# Patient Record
Sex: Male | Born: 1955 | Race: Black or African American | Hispanic: No | Marital: Single | Smoking: Never smoker
Health system: Southern US, Community
[De-identification: ages and names within clinical notes are randomized; demographics above are authoritative.]

## PROBLEM LIST (undated history)

## (undated) DIAGNOSIS — Z955 Presence of coronary angioplasty implant and graft: Secondary | ICD-10-CM

## (undated) DIAGNOSIS — Z9861 Coronary angioplasty status: Secondary | ICD-10-CM

## (undated) DIAGNOSIS — H8110 Benign paroxysmal vertigo, unspecified ear: Secondary | ICD-10-CM

## (undated) DIAGNOSIS — M545 Low back pain, unspecified: Secondary | ICD-10-CM

## (undated) DIAGNOSIS — I2119 ST elevation (STEMI) myocardial infarction involving other coronary artery of inferior wall: Secondary | ICD-10-CM

## (undated) DIAGNOSIS — T7840XA Allergy, unspecified, initial encounter: Secondary | ICD-10-CM

## (undated) DIAGNOSIS — I255 Ischemic cardiomyopathy: Secondary | ICD-10-CM

## (undated) DIAGNOSIS — I10 Essential (primary) hypertension: Secondary | ICD-10-CM

## (undated) DIAGNOSIS — I2510 Atherosclerotic heart disease of native coronary artery without angina pectoris: Secondary | ICD-10-CM

## (undated) DIAGNOSIS — E785 Hyperlipidemia, unspecified: Secondary | ICD-10-CM

## (undated) HISTORY — DX: Low back pain, unspecified: M54.50

## (undated) HISTORY — DX: Atherosclerotic heart disease of native coronary artery without angina pectoris: I25.10

## (undated) HISTORY — PX: NECK SURGERY: SHX720

## (undated) HISTORY — DX: ST elevation (STEMI) myocardial infarction involving other coronary artery of inferior wall: I21.19

## (undated) HISTORY — DX: Hyperlipidemia, unspecified: E78.5

## (undated) HISTORY — DX: Benign paroxysmal vertigo, unspecified ear: H81.10

## (undated) HISTORY — DX: Allergy, unspecified, initial encounter: T78.40XA

## (undated) HISTORY — DX: Low back pain: M54.5

## (undated) HISTORY — DX: Essential (primary) hypertension: I10

## (undated) HISTORY — DX: Atherosclerotic heart disease of native coronary artery without angina pectoris: Z98.61

## (undated) SURGERY — Surgical Case
Anesthesia: *Unknown

---

## 2004-07-18 ENCOUNTER — Ambulatory Visit: Payer: Self-pay | Admitting: Internal Medicine

## 2006-01-17 ENCOUNTER — Ambulatory Visit: Payer: Self-pay | Admitting: Internal Medicine

## 2006-06-04 ENCOUNTER — Ambulatory Visit: Payer: Self-pay | Admitting: Internal Medicine

## 2006-06-04 LAB — CONVERTED CEMR LAB
ALT: 36 units/L (ref 0–40)
Albumin: 4.1 g/dL (ref 3.5–5.2)
Alkaline Phosphatase: 63 units/L (ref 39–117)
BUN: 10 mg/dL (ref 6–23)
Calcium: 9.4 mg/dL (ref 8.4–10.5)
Chloride: 96 meq/L (ref 96–112)
Cholesterol: 233 mg/dL (ref 0–200)
Hemoglobin: 15 g/dL (ref 13.0–17.0)
Hgb A1c MFr Bld: 10.6 % — ABNORMAL HIGH (ref 4.6–6.0)
PSA: 0.65 ng/mL (ref 0.10–4.00)
Platelets: 251 10*3/uL (ref 150–400)
RBC: 5.07 M/uL (ref 4.22–5.81)
Total Protein: 7.4 g/dL (ref 6.0–8.3)

## 2006-07-05 ENCOUNTER — Ambulatory Visit: Payer: Self-pay | Admitting: Internal Medicine

## 2006-07-10 ENCOUNTER — Ambulatory Visit: Payer: Self-pay | Admitting: Internal Medicine

## 2006-07-24 ENCOUNTER — Encounter: Admission: RE | Admit: 2006-07-24 | Discharge: 2006-10-22 | Payer: Self-pay | Admitting: Internal Medicine

## 2006-08-13 ENCOUNTER — Ambulatory Visit: Payer: Self-pay | Admitting: Internal Medicine

## 2007-01-15 DIAGNOSIS — M545 Low back pain, unspecified: Secondary | ICD-10-CM | POA: Insufficient documentation

## 2007-01-15 DIAGNOSIS — J309 Allergic rhinitis, unspecified: Secondary | ICD-10-CM | POA: Insufficient documentation

## 2008-02-12 ENCOUNTER — Encounter: Admission: RE | Admit: 2008-02-12 | Discharge: 2008-02-12 | Payer: Self-pay | Admitting: Sports Medicine

## 2008-03-02 ENCOUNTER — Encounter: Admission: RE | Admit: 2008-03-02 | Discharge: 2008-03-02 | Payer: Self-pay | Admitting: Sports Medicine

## 2008-03-25 ENCOUNTER — Encounter: Admission: RE | Admit: 2008-03-25 | Discharge: 2008-03-25 | Payer: Self-pay | Admitting: Sports Medicine

## 2008-04-07 ENCOUNTER — Ambulatory Visit (HOSPITAL_BASED_OUTPATIENT_CLINIC_OR_DEPARTMENT_OTHER): Admission: RE | Admit: 2008-04-07 | Discharge: 2008-04-07 | Payer: Self-pay | Admitting: *Deleted

## 2008-04-07 ENCOUNTER — Encounter (INDEPENDENT_AMBULATORY_CARE_PROVIDER_SITE_OTHER): Payer: Self-pay | Admitting: *Deleted

## 2008-07-16 ENCOUNTER — Ambulatory Visit: Payer: Self-pay | Admitting: Internal Medicine

## 2008-07-16 LAB — CONVERTED CEMR LAB
BUN: 17 mg/dL (ref 6–23)
Basophils Relative: 1.1 % (ref 0.0–3.0)
Bilirubin Urine: NEGATIVE
Blood in Urine, dipstick: NEGATIVE
Calcium: 9.9 mg/dL (ref 8.4–10.5)
Chloride: 105 meq/L (ref 96–112)
Creatinine, Ser: 1 mg/dL (ref 0.4–1.5)
Eosinophils Relative: 9.5 % — ABNORMAL HIGH (ref 0.0–5.0)
GFR calc Af Amer: 102 mL/min
GFR calc non Af Amer: 84 mL/min
Glucose, Bld: 144 mg/dL — ABNORMAL HIGH (ref 70–99)
Glucose, Urine, Semiquant: NEGATIVE
HCT: 42.7 % (ref 39.0–52.0)
Hemoglobin: 14.2 g/dL (ref 13.0–17.0)
MCHC: 33.3 g/dL (ref 30.0–36.0)
MCV: 89.7 fL (ref 78.0–100.0)
Microalb Creat Ratio: 12.1 mg/g (ref 0.0–30.0)
Microalb, Ur: 2.1 mg/dL — ABNORMAL HIGH (ref 0.0–1.9)
Monocytes Relative: 7.8 % (ref 3.0–12.0)
Neutro Abs: 1.7 10*3/uL (ref 1.4–7.7)
Neutrophils Relative %: 32.7 % — ABNORMAL LOW (ref 43.0–77.0)
Nitrite: NEGATIVE
Protein, U semiquant: NEGATIVE
RBC: 4.76 M/uL (ref 4.22–5.81)
RDW: 12.7 % (ref 11.5–14.6)
TSH: 1.35 microintl units/mL (ref 0.35–5.50)
Total Protein: 7.6 g/dL (ref 6.0–8.3)
Urobilinogen, UA: 0.2
VLDL: 17 mg/dL (ref 0–40)
WBC Urine, dipstick: NEGATIVE
WBC: 5.3 10*3/uL (ref 4.5–10.5)

## 2008-07-23 ENCOUNTER — Ambulatory Visit: Payer: Self-pay | Admitting: Internal Medicine

## 2008-07-23 DIAGNOSIS — E1169 Type 2 diabetes mellitus with other specified complication: Secondary | ICD-10-CM | POA: Insufficient documentation

## 2008-07-23 DIAGNOSIS — E118 Type 2 diabetes mellitus with unspecified complications: Secondary | ICD-10-CM | POA: Insufficient documentation

## 2008-07-23 DIAGNOSIS — E785 Hyperlipidemia, unspecified: Secondary | ICD-10-CM

## 2008-10-15 ENCOUNTER — Ambulatory Visit: Payer: Self-pay | Admitting: Internal Medicine

## 2008-10-15 LAB — HM DIABETES EYE EXAM: HM Diabetic Eye Exam: NORMAL

## 2008-10-28 ENCOUNTER — Encounter: Payer: Self-pay | Admitting: Internal Medicine

## 2009-01-20 ENCOUNTER — Encounter: Payer: Self-pay | Admitting: Internal Medicine

## 2009-03-23 ENCOUNTER — Encounter: Payer: Self-pay | Admitting: Internal Medicine

## 2009-04-15 ENCOUNTER — Encounter: Payer: Self-pay | Admitting: *Deleted

## 2009-08-12 ENCOUNTER — Observation Stay (HOSPITAL_COMMUNITY): Admission: EM | Admit: 2009-08-12 | Discharge: 2009-08-16 | Payer: Self-pay | Admitting: Emergency Medicine

## 2009-08-13 ENCOUNTER — Ambulatory Visit: Payer: Self-pay | Admitting: Vascular Surgery

## 2009-08-13 ENCOUNTER — Encounter (INDEPENDENT_AMBULATORY_CARE_PROVIDER_SITE_OTHER): Payer: Self-pay | Admitting: Internal Medicine

## 2009-10-27 DIAGNOSIS — H8110 Benign paroxysmal vertigo, unspecified ear: Secondary | ICD-10-CM

## 2009-10-27 HISTORY — DX: Benign paroxysmal vertigo, unspecified ear: H81.10

## 2010-06-19 ENCOUNTER — Encounter: Payer: Self-pay | Admitting: Sports Medicine

## 2010-08-22 LAB — BASIC METABOLIC PANEL
BUN: 7 mg/dL (ref 6–23)
BUN: 7 mg/dL (ref 6–23)
CO2: 27 mEq/L (ref 19–32)
CO2: 28 mEq/L (ref 19–32)
Calcium: 8.5 mg/dL (ref 8.4–10.5)
Calcium: 9.2 mg/dL (ref 8.4–10.5)
Chloride: 101 mEq/L (ref 96–112)
Chloride: 105 mEq/L (ref 96–112)
Creatinine, Ser: 0.93 mg/dL (ref 0.4–1.5)
GFR calc Af Amer: >60 mL/min (ref >60)
GFR calc Af Amer: >60 mL/min (ref >60)
GFR calc non Af Amer: >60 mL/min (ref >60)
GFR calc non Af Amer: >60 mL/min (ref >60)
Glucose, Bld: 130 mg/dL — ABNORMAL HIGH (ref 70–99)
Glucose, Bld: 225 mg/dL — ABNORMAL HIGH (ref 70–99)
Potassium: 3.6 mEq/L (ref 3.5–5.1)
Sodium: 135 mEq/L (ref 135–145)
Sodium: 137 mEq/L (ref 135–145)
Sodium: 139 mEq/L (ref 135–145)

## 2010-08-22 LAB — DIFFERENTIAL
Basophils Absolute: 0 10*3/uL (ref 0.0–0.1)
Basophils Relative: 0 % (ref 0–1)
Eosinophils Absolute: 0.1 10*3/uL (ref 0.0–0.7)
Eosinophils Relative: 1 % (ref 0–5)
Lymphs Abs: 2.9 10*3/uL (ref 0.7–4.0)
Monocytes Absolute: 0.7 10*3/uL (ref 0.1–1.0)
Monocytes Relative: 5 % (ref 3–12)
Neutro Abs: 9.9 10*3/uL — ABNORMAL HIGH (ref 1.7–7.7)
Neutrophils Relative %: 72 % (ref 43–77)

## 2010-08-22 LAB — GLUCOSE, CAPILLARY
Glucose-Capillary: 185 mg/dL — ABNORMAL HIGH (ref 70–99)
Glucose-Capillary: 205 mg/dL — ABNORMAL HIGH (ref 70–99)
Glucose-Capillary: 223 mg/dL — ABNORMAL HIGH (ref 70–99)
Glucose-Capillary: 239 mg/dL — ABNORMAL HIGH (ref 70–99)
Glucose-Capillary: 254 mg/dL — ABNORMAL HIGH (ref 70–99)
Glucose-Capillary: 266 mg/dL — ABNORMAL HIGH (ref 70–99)
Glucose-Capillary: 345 mg/dL — ABNORMAL HIGH (ref 70–99)

## 2010-08-22 LAB — URINALYSIS, ROUTINE W REFLEX MICROSCOPIC
Bilirubin Urine: NEGATIVE
Glucose, UA: >1000 mg/dL — AB
Hgb urine dipstick: NEGATIVE
Ketones, ur: >80 mg/dL — AB
Leukocytes, UA: NEGATIVE
Nitrite: NEGATIVE
Specific Gravity, Urine: 1.042 — ABNORMAL HIGH (ref 1.005–1.030)
pH: 5.5 (ref 5.0–8.0)

## 2010-08-22 LAB — CBC
HCT: 38.1 % — ABNORMAL LOW (ref 39.0–52.0)
HCT: 39.4 % (ref 39.0–52.0)
HCT: 40.9 % (ref 39.0–52.0)
HCT: 43.4 % (ref 39.0–52.0)
Hemoglobin: 12.8 g/dL — ABNORMAL LOW (ref 13.0–17.0)
Hemoglobin: 12.8 g/dL — ABNORMAL LOW (ref 13.0–17.0)
Hemoglobin: 13.3 g/dL (ref 13.0–17.0)
MCHC: 33.2 g/dL (ref 30.0–36.0)
MCHC: 33.4 g/dL (ref 30.0–36.0)
MCHC: 34.3 g/dL (ref 30.0–36.0)
MCV: 90.2 fL (ref 78.0–100.0)
MCV: 91.1 fL (ref 78.0–100.0)
Platelets: 191 10*3/uL (ref 150–400)
Platelets: 218 10*3/uL (ref 150–400)
RBC: 4.11 MIL/uL — ABNORMAL LOW (ref 4.22–5.81)
RBC: 4.2 MIL/uL — ABNORMAL LOW (ref 4.22–5.81)
RBC: 4.37 MIL/uL (ref 4.22–5.81)
RBC: 4.76 MIL/uL (ref 4.22–5.81)
RDW: 12.7 % (ref 11.5–15.5)
RDW: 13.3 % (ref 11.5–15.5)
WBC: 13.7 10*3/uL — ABNORMAL HIGH (ref 4.0–10.5)
WBC: 7.2 10*3/uL (ref 4.0–10.5)
WBC: 7.6 10*3/uL (ref 4.0–10.5)

## 2010-08-22 LAB — COMPREHENSIVE METABOLIC PANEL
ALT: 21 U/L (ref 0–53)
Albumin: 4.4 g/dL (ref 3.5–5.2)
Calcium: 9.1 mg/dL (ref 8.4–10.5)
GFR calc non Af Amer: >60 mL/min (ref >60)
Total Bilirubin: 0.8 mg/dL (ref 0.3–1.2)
Total Protein: 7.6 g/dL (ref 6.0–8.3)

## 2010-08-22 LAB — POCT CARDIAC MARKERS
CKMB, poc: <1 ng/mL — ABNORMAL LOW (ref 1.0–8.0)
Troponin i, poc: <0.05 ng/mL (ref 0.00–0.09)

## 2010-08-22 LAB — HEMOGLOBIN A1C
Hgb A1c MFr Bld: 11.8 % — ABNORMAL HIGH (ref 4.6–6.1)
Mean Plasma Glucose: 298 mg/dL

## 2010-08-22 LAB — LIPASE, BLOOD: Lipase: 32 U/L (ref 11–59)

## 2010-10-11 NOTE — Op Note (Signed)
Matthew Prince, Matthew Prince                  ACCOUNT NO.:  [id number]   MEDICAL RECORD NO.:  [id number]          PATIENT TYPE:  AMB   LOCATION:  [facility]                         FACILITY:  [facility]   PHYSICIAN:  Alfonse Ras, MD   DATE OF BIRTH:  1956-04-06   DATE OF PROCEDURE:  DATE OF DISCHARGE:                               OPERATIVE REPORT   PREOPERATIVE DIAGNOSIS:  Subcutaneous mass in the posterior neck.   POSTOPERATIVE DIAGNOSIS:  Subcutaneous mass in the posterior neck,  lipoma.   OPERATION:  Excision of subcutaneous mass of the posterior neck.   ANESTHESIA:  General endotracheal tube.   DESCRIPTION:  After informed consent was granted, the patient was taken  to the operating room and given general anesthesia.  He was placed in  the prone position.  Posterior neck was prepped and draped in normal  sterile fashion.  Using a transverse incision over the palpable mass, I  dissected down through the epidermis and subcutaneous tissue using Bovie  electrocautery.  The mass was mobilized using Bovie electrocautery off  the muscular fascia and excised in its entirety and sent for pathologic  evaluation.  Adequate hemostasis was ensured.  The wound was copiously  irrigated and closed in two layers using a running 2-0 Vicryl  subcutaneous stitch and a 3-0 Monocryl.  Sterile dressing was applied.  The patient tolerated the procedure well and went to PACU in good  condition.      Alfonse Ras, MD  Electronically Signed     KRE/MEDQ  D:  04/07/2008  T:  04/07/2008  Job:  [id number]

## 2010-10-14 NOTE — Assessment & Plan Note (Signed)
[facility]                            [facility] OFFICE NOTE   PHARES, ZACCONE                         MRN:          [id number]  DATE:07/05/2006                            DOB:          1956-04-06    The patient is seen today for followup of his diabetes.  He was seen 1  month ago and noted to have a random sugar in excess of 500.  Unfortunately, he left for [place] the following day and has returned  today for a followup.  He has just returned from [place].  He has noticed  some perhaps slight increase in urination and thirst, but no change in  his vision.  Random blood sugar today was 346.   DISPOSITION:  The patient received extensive diabetic supplies and  literature today.  He is given a home monitor and will be started on  meal-time insulin with a sliding scale.  He will be set up for DTC  classes.  Dietary information was dispensed.  He is also started on  glimepiride 4 mg b.i.d. and metformin 500 mg b.i.d.  He will return  early next week for followup.     Gordy Savers, MD  Electronically Signed    PFK/MedQ  DD: 07/05/2006  DT: 07/05/2006  Job #: [id number]

## 2010-10-14 NOTE — Assessment & Plan Note (Signed)
[name]                            [facility] OFFICE NOTE   RAKEEN, GAILLARD                         MRN:          [id number]  DATE:06/04/2006                            DOB:          1955/12/02    A 55 year old African American male who was seen today for a wellness  exam.  He has had chronic low back pain probably related more to spasm.  He has enjoyed excellent health and has a history of mild borderline  hypercholesterolemia, as well as some mild seasonal allergic rhinitis  type symptoms.  He also has a complaint of an enlarging lump involving  his posterior neck area.   PAST MEDICAL HISTORY:  Is negative.  No prior hospital admissions.  Nonsmoker.   FAMILY HISTORY:  Father died at 49 of unclear causes, mother died at 44,  unclear causes. Both lived in [place].  Four brothers, four sisters, all  well except for a sister who died an accidental death.   SOCIAL HISTORY:  He is married, 2 children.  He was born in [place] and  works as a Teacher, adult education.   EXAMINATION:  Revealed a healthy appearing fit male, no acute distress.  Blood pressure 120/82.  Fundi, ear, nose and throat clear.  NECK:  Did reveal approximate 4 to 5 cm subcutaneous nodule.  This was  nontender, not inflamed.  CHEST:  Was clear.  CARDIOVASCULAR:  Normal heart sounds, no murmurs.  ABDOMEN:  Benign.  EXTERNAL GENITALIA:  Normal.  RECTAL:  Prostate not well evaluated due to tight sphincter and  discomfort.  EXTREMITIES:  Negative with full peripheral pulses.   IMPRESSION:  Unremarkable clinical exam.  Mild hypercholesterolemia.  Seasonal allergic rhinitis.  Probable sebaceous cyst involving his  posterior neck.  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   DISPOSITION:  Stretching exercises were discussed again and encouraged.  He has not tried this in the past.  He will continue p.r.n. anti-  inflammatories as needed.  Laboratory profile  will be obtained.  Return  here in 1 or 2 years for followup.     Gordy Savers, MD  Electronically Signed    PFK/MedQ  DD: 06/04/2006  DT: 06/04/2006  Job #: [id number]

## 2010-11-23 ENCOUNTER — Other Ambulatory Visit (INDEPENDENT_AMBULATORY_CARE_PROVIDER_SITE_OTHER): Payer: PRIVATE HEALTH INSURANCE

## 2010-11-23 DIAGNOSIS — Z0000 Encounter for general adult medical examination without abnormal findings: Secondary | ICD-10-CM

## 2010-11-23 DIAGNOSIS — Z0389 Encounter for observation for other suspected diseases and conditions ruled out: Secondary | ICD-10-CM

## 2010-11-23 LAB — URINALYSIS
Bilirubin Urine: NEGATIVE
Hgb urine dipstick: NEGATIVE
Ketones, ur: NEGATIVE
Leukocytes, UA: NEGATIVE
Specific Gravity, Urine: >=1.03 (ref 1.000–1.030)
Urine Glucose: NEGATIVE
Urobilinogen, UA: 0.2 (ref 0.0–1.0)

## 2010-11-23 LAB — LIPID PANEL
HDL: 39 mg/dL — ABNORMAL LOW (ref >39.00)
Total CHOL/HDL Ratio: 7
Triglycerides: 57 mg/dL (ref 0.0–149.0)
VLDL: 11.4 mg/dL (ref 0.0–40.0)

## 2010-11-23 LAB — LDL CHOLESTEROL, DIRECT: Direct LDL: 182 mg/dL

## 2010-11-23 LAB — BASIC METABOLIC PANEL
CO2: 29 mEq/L (ref 19–32)
Chloride: 104 mEq/L (ref 96–112)
Glucose, Bld: 117 mg/dL — ABNORMAL HIGH (ref 70–99)
Potassium: 4.8 mEq/L (ref 3.5–5.1)
Sodium: 139 mEq/L (ref 135–145)

## 2010-11-23 LAB — CBC WITH DIFFERENTIAL/PLATELET
Basophils Absolute: 0 10*3/uL (ref 0.0–0.1)
Basophils Relative: 0.8 % (ref 0.0–3.0)
Eosinophils Absolute: 0.2 10*3/uL (ref 0.0–0.7)
Hemoglobin: 14.8 g/dL (ref 13.0–17.0)
Lymphocytes Relative: 47.7 % — ABNORMAL HIGH (ref 12.0–46.0)
Lymphs Abs: 2.9 10*3/uL (ref 0.7–4.0)
MCHC: 33.5 g/dL (ref 30.0–36.0)
MCV: 90.5 fl (ref 78.0–100.0)
Monocytes Absolute: 0.5 10*3/uL (ref 0.1–1.0)
Neutro Abs: 2.4 10*3/uL (ref 1.4–7.7)
RDW: 13.7 % (ref 11.5–14.6)

## 2010-11-23 LAB — TSH: TSH: 1.33 u[IU]/mL (ref 0.35–5.50)

## 2010-11-23 LAB — HEPATIC FUNCTION PANEL
ALT: 26 U/L (ref 0–53)
Bilirubin, Direct: 0.1 mg/dL (ref 0.0–0.3)
Total Bilirubin: 0.6 mg/dL (ref 0.3–1.2)

## 2010-11-29 ENCOUNTER — Encounter: Payer: Self-pay | Admitting: Internal Medicine

## 2010-12-01 ENCOUNTER — Encounter: Payer: Self-pay | Admitting: Internal Medicine

## 2010-12-01 ENCOUNTER — Ambulatory Visit (INDEPENDENT_AMBULATORY_CARE_PROVIDER_SITE_OTHER): Payer: Managed Care, Other (non HMO) | Admitting: Internal Medicine

## 2010-12-01 DIAGNOSIS — E119 Type 2 diabetes mellitus without complications: Secondary | ICD-10-CM

## 2010-12-01 DIAGNOSIS — Z0000 Encounter for general adult medical examination without abnormal findings: Secondary | ICD-10-CM

## 2010-12-01 DIAGNOSIS — E785 Hyperlipidemia, unspecified: Secondary | ICD-10-CM

## 2010-12-01 DIAGNOSIS — Z23 Encounter for immunization: Secondary | ICD-10-CM

## 2010-12-01 MED ORDER — METFORMIN HCL 1000 MG PO TABS: 1000.0000 mg | ORAL_TABLET | Freq: Two times a day (BID) | ORAL | Status: DC

## 2010-12-01 MED ORDER — ATORVASTATIN CALCIUM 40 MG PO TABS: 40.0000 mg | ORAL_TABLET | Freq: Every day | ORAL | Status: DC

## 2010-12-01 NOTE — Patient Instructions (Signed)
Please check your hemoglobin A1c every 3 months    It is important that you exercise regularly, at least 20 minutes 3 to 4 times per week.  If you develop chest pain or shortness of breath seek  medical attention.  Schedule your colonoscopy to help detect colon cancer.

## 2010-12-01 NOTE — Progress Notes (Signed)
Subjective:    Patient ID: Matthew Prince, male    DOB: Jun 18, 1955, 55 y.o.   MRN: [id number]  HPI  55 year old patient who is seen today for a wellness exam he has a history of type 2 diabetes but has stopped followup with his endocrinologist and has been on no medications for in excess of 6 months. He has a history of dyslipidemia and has been on Crestor in the past. He has also been on niacin. Recent laboratory studies were reviewed a fasting blood sugar was 117 and random blood sugar today 134. He has adopted the much improved lifestyle changes with better diet and exercise. His weight is unchanged.  Current Allergies:  IODINE (IODINE)  Past Medical History:  Reviewed history from 01/15/2007 and no changes required:  hypercholesterolemia  Allergic rhinitis  Low back pain  Diabetes mellitus, type II  Past Surgical History:  Reviewed history from 01/15/2007 and no changes required:  Denies surgical history  resection benign tumor, posterior neck  Family History:  Reviewed history from 01/15/2007 and no changes required:  father died age 21, unclear cause  mother died age 29, unclear cause. Both lived in [place]  Social History:  Reviewed history from 01/15/2007 and no changes required:  Never Smoked  Alcohol use-no  Occupation: [employer]-  Married-one son one daughter  born in [place]. Father had 5 wise he has 4 brothers 4 sisters details of their health unknown      Review of Systems  Constitutional: Negative for fever, chills, activity change, appetite change and fatigue.  HENT: Negative for hearing loss, ear pain, congestion, rhinorrhea, sneezing, mouth sores, trouble swallowing, neck pain, neck stiffness, dental problem, voice change, sinus pressure and tinnitus.   Eyes: Negative for photophobia, pain, redness and visual disturbance.  Respiratory: Negative for apnea, cough, choking, chest tightness, shortness of breath and wheezing.   Cardiovascular: Negative for chest pain,  palpitations and leg swelling.  Gastrointestinal: Negative for nausea, vomiting, abdominal pain, diarrhea, constipation, blood in stool, abdominal distention, anal bleeding and rectal pain.  Genitourinary: Negative for dysuria, urgency, frequency, hematuria, flank pain, decreased urine volume, discharge, penile swelling, scrotal swelling, difficulty urinating, genital sores and testicular pain.  Musculoskeletal: Negative for myalgias, back pain, joint swelling, arthralgias and gait problem.  Skin: Negative for color change, rash and wound.  Neurological: Negative for dizziness, tremors, seizures, syncope, facial asymmetry, speech difficulty, weakness, light-headedness, numbness and headaches.  Hematological: Negative for adenopathy. Does not bruise/bleed easily.  Psychiatric/Behavioral: Negative for suicidal ideas, hallucinations, behavioral problems, confusion, sleep disturbance, self-injury, dysphoric mood, decreased concentration and agitation. The patient is not nervous/anxious.        Objective:   Physical Exam  Constitutional: He appears well-developed and well-nourished.  HENT:  Head: Normocephalic and atraumatic.  Right Ear: External ear normal.  Left Ear: External ear normal.  Nose: Nose normal.  Mouth/Throat: Oropharynx is clear and moist.  Eyes: Conjunctivae and EOM are normal. Pupils are equal, round, and reactive to light. No scleral icterus.  Neck: Normal range of motion. Neck supple. No JVD present. No thyromegaly present.  Cardiovascular: Regular rhythm, normal heart sounds and intact distal pulses.  Exam reveals no gallop and no friction rub.   No murmur heard. Pulmonary/Chest: Effort normal and breath sounds normal. He exhibits no tenderness.  Abdominal: Soft. Bowel sounds are normal. He exhibits no distension and no mass. There is no tenderness.  Genitourinary: Prostate normal and penis normal.  Musculoskeletal: Normal range of motion. He exhibits no edema  and no  tenderness.  Lymphadenopathy:    He has no cervical adenopathy.  Neurological: He is alert. He has normal reflexes. No cranial nerve deficit. Coordination normal.  Skin: Skin is warm and dry. No rash noted.  Psychiatric: He has a normal mood and affect. His behavior is normal.          Assessment & Plan:    Annual examination Type 2 diabetes. Seems well controlled off medications at the present time we'll check a hemoglobin A1c and resume metformin therapy only Dyslipidemia. Will place on atorvastatin 40 mg daily. We'll discontinue niacin  Recheck 3 months

## 2010-12-27 ENCOUNTER — Ambulatory Visit (AMBULATORY_SURGERY_CENTER): Payer: Managed Care, Other (non HMO) | Admitting: *Deleted

## 2010-12-27 VITALS — Ht 68.0 in | Wt 180.0 lb

## 2010-12-27 DIAGNOSIS — Z1211 Encounter for screening for malignant neoplasm of colon: Secondary | ICD-10-CM

## 2010-12-27 MED ORDER — PEG-KCL-NACL-NASULF-NA ASC-C 100 G PO SOLR: ORAL | Status: DC

## 2010-12-28 ENCOUNTER — Encounter: Payer: Self-pay | Admitting: Gastroenterology

## 2011-01-09 ENCOUNTER — Encounter: Payer: Self-pay | Admitting: Gastroenterology

## 2011-01-09 ENCOUNTER — Ambulatory Visit (AMBULATORY_SURGERY_CENTER): Payer: Managed Care, Other (non HMO) | Admitting: Gastroenterology

## 2011-01-09 VITALS — BP 138/85 | HR 86 | Temp 97.5°F | Resp 20 | Ht 68.0 in | Wt 180.0 lb

## 2011-01-09 DIAGNOSIS — D126 Benign neoplasm of colon, unspecified: Secondary | ICD-10-CM | POA: Insufficient documentation

## 2011-01-09 DIAGNOSIS — D129 Benign neoplasm of anus and anal canal: Secondary | ICD-10-CM

## 2011-01-09 DIAGNOSIS — Z1211 Encounter for screening for malignant neoplasm of colon: Secondary | ICD-10-CM | POA: Insufficient documentation

## 2011-01-09 DIAGNOSIS — K5730 Diverticulosis of large intestine without perforation or abscess without bleeding: Secondary | ICD-10-CM | POA: Insufficient documentation

## 2011-01-09 DIAGNOSIS — D128 Benign neoplasm of rectum: Secondary | ICD-10-CM

## 2011-01-09 LAB — GLUCOSE, CAPILLARY
Glucose-Capillary: 116 mg/dL — ABNORMAL HIGH (ref 70–99)
Glucose-Capillary: 104 mg/dL — ABNORMAL HIGH (ref 70–99)

## 2011-01-09 MED ORDER — SODIUM CHLORIDE 0.9 % IV SOLN: 500.0000 mL | INTRAVENOUS | Status: DC

## 2011-01-09 NOTE — Patient Instructions (Signed)
Please read the handouts given to you by your recovery room nurse.    You need to increase the fiber in your diet due to the diverticulosis in your colon.    Your biopsy results will be mailed to you within 2 weeks.   Resume your routine medications today.  IF you have any questions, please call us at [phone].  Thank-you.

## 2011-01-10 ENCOUNTER — Telehealth: Payer: Self-pay

## 2011-01-10 NOTE — Telephone Encounter (Signed)
No ID on answering machine. 

## 2011-01-13 ENCOUNTER — Encounter: Payer: Self-pay | Admitting: Gastroenterology

## 2011-02-28 LAB — POCT I-STAT 4, (NA,K, GLUC, HGB,HCT): Glucose, Bld: 146 — ABNORMAL HIGH

## 2011-02-28 LAB — GLUCOSE, CAPILLARY: Glucose-Capillary: 157 — ABNORMAL HIGH

## 2011-03-03 ENCOUNTER — Ambulatory Visit (INDEPENDENT_AMBULATORY_CARE_PROVIDER_SITE_OTHER): Payer: Managed Care, Other (non HMO) | Admitting: Internal Medicine

## 2011-03-03 ENCOUNTER — Encounter: Payer: Self-pay | Admitting: Internal Medicine

## 2011-03-03 DIAGNOSIS — E785 Hyperlipidemia, unspecified: Secondary | ICD-10-CM

## 2011-03-03 DIAGNOSIS — E119 Type 2 diabetes mellitus without complications: Secondary | ICD-10-CM

## 2011-03-03 LAB — GLUCOSE, POCT (MANUAL RESULT ENTRY): POC Glucose: 146

## 2011-03-03 NOTE — Patient Instructions (Signed)
Please check your hemoglobin A1c every 3 months    It is important that you exercise regularly, at least 20 minutes 3 to 4 times per week.  If you develop chest pain or shortness of breath seek  medical attention.  You need to lose weight.  Consider a lower calorie diet and regular exercise. 

## 2011-03-03 NOTE — Progress Notes (Signed)
  Subjective:    Patient ID: Matthew Prince, male    DOB: 12/22/1955, 55 y.o.   MRN: [id number]  HPI  55 year old patient who is seen today for followup of his diabetes. He was seen 3 months ago after a prolonged absence of off medications. He had adopted the much better lifestyle changes and had maintained reasonable glycemic control off medications metformin 1000 mg twice daily was resumed. Over the last 3 months his weight is up 3 pounds he states that he is now not exercising very regularly. He feels well.    Review of Systems  Constitutional: Negative for fever, chills, appetite change and fatigue.  HENT: Negative for hearing loss, ear pain, congestion, sore throat, trouble swallowing, neck stiffness, dental problem, voice change and tinnitus.   Eyes: Negative for pain, discharge and visual disturbance.  Respiratory: Negative for cough, chest tightness, wheezing and stridor.   Cardiovascular: Negative for chest pain, palpitations and leg swelling.  Gastrointestinal: Negative for nausea, vomiting, abdominal pain, diarrhea, constipation, blood in stool and abdominal distention.  Genitourinary: Negative for urgency, hematuria, flank pain, discharge, difficulty urinating and genital sores.  Musculoskeletal: Negative for myalgias, back pain, joint swelling, arthralgias and gait problem.  Skin: Negative for rash.  Neurological: Negative for dizziness, syncope, speech difficulty, weakness, numbness and headaches.  Hematological: Negative for adenopathy. Does not bruise/bleed easily.  Psychiatric/Behavioral: Negative for behavioral problems and dysphoric mood. The patient is not nervous/anxious.        Objective:   Physical Exam  Constitutional: He is oriented to person, place, and time. He appears well-developed.  HENT:  Head: Normocephalic.  Right Ear: External ear normal.  Left Ear: External ear normal.  Eyes: Conjunctivae and EOM are normal.  Neck: Normal range of motion.    Cardiovascular: Normal rate and normal heart sounds.   Pulmonary/Chest: Breath sounds normal.  Abdominal: Bowel sounds are normal.  Musculoskeletal: Normal range of motion. He exhibits no edema and no tenderness.  Neurological: He is alert and oriented to person, place, and time.  Psychiatric: He has a normal mood and affect. His behavior is normal.          Assessment & Plan:    Diabetes mellitus. Will check a hemoglobin A1c. Last measures discussed and encouraged. If his   hemoglobin A1c is in excess of 7 will add a sulfonylurea  Dyslipidemia will continue atorvastatin

## 2011-03-03 NOTE — Progress Notes (Signed)
  Subjective:    Patient ID: Matthew Prince, male    DOB: December 17, 1955, 55 y.o.   MRN: [id number]  HPI  Wt Readings from Last 3 Encounters:  03/03/11 183 lb (83.008 kg)  01/09/11 180 lb (81.647 kg)  12/27/10 180 lb (81.647 kg)     Review of Systems     Objective:   Physical Exam        Assessment & Plan:

## 2011-05-02 ENCOUNTER — Telehealth: Payer: Self-pay | Admitting: Family Medicine

## 2011-05-02 NOTE — Telephone Encounter (Signed)
Last seen 03/03/11 3 mos rov - please advise on letter

## 2011-05-02 NOTE — Telephone Encounter (Signed)
Patient is scheduled to have dental laser surgery on Dec 7th. They need a letter or statement faxed to their office stating that his diabetes is in a stable condition where pt is ok to have procedure done. Fax: [phone], ATTNFlorentina Addison.

## 2011-05-02 NOTE — Telephone Encounter (Signed)
Note dictated

## 2011-05-03 ENCOUNTER — Telehealth: Payer: Self-pay

## 2011-05-03 NOTE — Telephone Encounter (Signed)
Letter has been faxed.

## 2011-05-03 NOTE — Telephone Encounter (Signed)
Opened in error

## 2011-06-02 ENCOUNTER — Encounter: Payer: Self-pay | Admitting: Internal Medicine

## 2011-06-02 ENCOUNTER — Ambulatory Visit (INDEPENDENT_AMBULATORY_CARE_PROVIDER_SITE_OTHER): Payer: Managed Care, Other (non HMO) | Admitting: Internal Medicine

## 2011-06-02 DIAGNOSIS — E785 Hyperlipidemia, unspecified: Secondary | ICD-10-CM

## 2011-06-02 DIAGNOSIS — E119 Type 2 diabetes mellitus without complications: Secondary | ICD-10-CM

## 2011-06-02 MED ORDER — METFORMIN HCL 1000 MG PO TABS: 1000.0000 mg | ORAL_TABLET | Freq: Two times a day (BID) | ORAL | Status: DC

## 2011-06-02 MED ORDER — ATORVASTATIN CALCIUM 40 MG PO TABS: 40.0000 mg | ORAL_TABLET | Freq: Every day | ORAL | Status: DC

## 2011-06-02 NOTE — Patient Instructions (Signed)
Please check your hemoglobin A1c every 3 months    It is important that you exercise regularly, at least 20 minutes 3 to 4 times per week.  If you develop chest pain or shortness of breath seek  medical attention.   

## 2011-06-02 NOTE — Progress Notes (Signed)
  Subjective:    Patient ID: Matthew Prince, male    DOB: [birth year], 56 y.o.   MRN: [id number]  HPI 56 year old patient who is in today for followup of his type diabetes. He has treated dyslipidemia on atorvastatin 40 mg daily. He is doing quite well although he checks home blood sugar readings in frequently. He is on metformin therapy alone for diabetic control his weight has been stable. No recent hemoglobin A1c    Review of Systems  Constitutional: Negative for fever, chills, appetite change and fatigue.  HENT: Negative for hearing loss, ear pain, congestion, sore throat, trouble swallowing, neck stiffness, dental problem, voice change and tinnitus.   Eyes: Negative for pain, discharge and visual disturbance.  Respiratory: Negative for cough, chest tightness, wheezing and stridor.   Cardiovascular: Negative for chest pain, palpitations and leg swelling.  Gastrointestinal: Negative for nausea, vomiting, abdominal pain, diarrhea, constipation, blood in stool and abdominal distention.  Genitourinary: Negative for urgency, hematuria, flank pain, discharge, difficulty urinating and genital sores.  Musculoskeletal: Negative for myalgias, back pain, joint swelling, arthralgias and gait problem.  Skin: Negative for rash.  Neurological: Negative for dizziness, syncope, speech difficulty, weakness, numbness and headaches.  Hematological: Negative for adenopathy. Does not bruise/bleed easily.  Psychiatric/Behavioral: Negative for behavioral problems and dysphoric mood. The patient is not nervous/anxious.        Objective:   Physical Exam  Constitutional: He is oriented to person, place, and time. He appears well-developed.  HENT:  Head: Normocephalic.  Right Ear: External ear normal.  Left Ear: External ear normal.  Eyes: Conjunctivae and EOM are normal.  Neck: Normal range of motion.  Cardiovascular: Normal rate and normal heart sounds.   Pulmonary/Chest: Breath sounds normal.  Abdominal:  Bowel sounds are normal.  Musculoskeletal: Normal range of motion. He exhibits no edema and no tenderness.  Neurological: He is alert and oriented to person, place, and time.  Psychiatric: He has a normal mood and affect. His behavior is normal.          Assessment & Plan:    Diabetes mellitus unclear control. We'll check a hemoglobin A1c. We'll continue aggressive lifestyle changes. They require the addition of a sulfonylurea if hemoglobin A1c in excess of 7 Dyslipidemia. We'll check a total cholesterol

## 2011-06-03 LAB — HEMOGLOBIN A1C: Mean Plasma Glucose: 148 mg/dL — ABNORMAL HIGH (ref <117)

## 2011-08-31 ENCOUNTER — Encounter: Payer: Self-pay | Admitting: Internal Medicine

## 2011-08-31 ENCOUNTER — Ambulatory Visit (INDEPENDENT_AMBULATORY_CARE_PROVIDER_SITE_OTHER): Payer: Managed Care, Other (non HMO) | Admitting: Internal Medicine

## 2011-08-31 VITALS — BP 124/80 | Temp 98.0°F | Wt 185.0 lb

## 2011-08-31 DIAGNOSIS — E785 Hyperlipidemia, unspecified: Secondary | ICD-10-CM

## 2011-08-31 DIAGNOSIS — E119 Type 2 diabetes mellitus without complications: Secondary | ICD-10-CM

## 2011-08-31 NOTE — Progress Notes (Signed)
  Subjective:    Patient ID: Matthew Prince, male    DOB: [birth year], 56 y.o.   MRN: [id number]  HPI  56 year old patient who has type 2 diabetes and dyslipidemia. He is seen today for his quarterly followup. He is doing quite well without concerns or complaints. His weight is up 3 pounds his last hemoglobin A1c was 6.8    Review of Systems  Constitutional: Negative for fever, chills, appetite change and fatigue.  HENT: Negative for hearing loss, ear pain, congestion, sore throat, trouble swallowing, neck stiffness, dental problem, voice change and tinnitus.   Eyes: Negative for pain, discharge and visual disturbance.  Respiratory: Negative for cough, chest tightness, wheezing and stridor.   Cardiovascular: Negative for chest pain, palpitations and leg swelling.  Gastrointestinal: Negative for nausea, vomiting, abdominal pain, diarrhea, constipation, blood in stool and abdominal distention.  Genitourinary: Negative for urgency, hematuria, flank pain, discharge, difficulty urinating and genital sores.  Musculoskeletal: Negative for myalgias, back pain, joint swelling, arthralgias and gait problem.  Skin: Negative for rash.  Neurological: Negative for dizziness, syncope, speech difficulty, weakness, numbness and headaches.  Hematological: Negative for adenopathy. Does not bruise/bleed easily.  Psychiatric/Behavioral: Negative for behavioral problems and dysphoric mood. The patient is not nervous/anxious.        Objective:   Physical Exam  Constitutional: He is oriented to person, place, and time. He appears well-developed.  HENT:  Head: Normocephalic.  Right Ear: External ear normal.  Left Ear: External ear normal.  Eyes: Conjunctivae and EOM are normal.  Neck: Normal range of motion.  Cardiovascular: Normal rate and normal heart sounds.   Pulmonary/Chest: Breath sounds normal.  Abdominal: Bowel sounds are normal.  Musculoskeletal: Normal range of motion. He exhibits no edema and no  tenderness.  Neurological: He is alert and oriented to person, place, and time.  Psychiatric: He has a normal mood and affect. His behavior is normal.          Assessment & Plan:   Diabetes mellitus. We'll check a hemoglobin A1c I ; ophthalmology exam recommended Dyslipidemia stable

## 2011-08-31 NOTE — Patient Instructions (Signed)
It is important that you exercise regularly, at least 20 minutes 3 to 4 times per week.  If you develop chest pain or shortness of breath seek  medical attention.   Please check your hemoglobin A1c every 3 months  You need to lose weight.  Consider a lower calorie diet and regular exercise. 

## 2011-08-31 NOTE — Progress Notes (Signed)
  Subjective:    Patient ID: Matthew Prince, male    DOB: [birth year], 56 y.o.   MRN: [id number]  HPI  Wt Readings from Last 3 Encounters:  08/31/11 185 lb (83.915 kg)  06/02/11 182 lb (82.555 kg)  03/03/11 183 lb (83.008 kg)    Review of Systems     Objective:   Physical Exam        Assessment & Plan:

## 2011-12-01 ENCOUNTER — Encounter: Payer: Self-pay | Admitting: Internal Medicine

## 2011-12-01 ENCOUNTER — Ambulatory Visit (INDEPENDENT_AMBULATORY_CARE_PROVIDER_SITE_OTHER): Payer: Managed Care, Other (non HMO) | Admitting: Internal Medicine

## 2011-12-01 VITALS — BP 122/80 | Temp 98.2°F | Wt 183.0 lb

## 2011-12-01 DIAGNOSIS — E785 Hyperlipidemia, unspecified: Secondary | ICD-10-CM

## 2011-12-01 DIAGNOSIS — E119 Type 2 diabetes mellitus without complications: Secondary | ICD-10-CM

## 2011-12-01 NOTE — Patient Instructions (Addendum)
Limit your sodium (Salt) intake    It is important that you exercise regularly, at least 20 minutes 3 to 4 times per week.  If you develop chest pain or shortness of breath seek  medical attention.   Please check your hemoglobin A1c every 3 months   

## 2011-12-01 NOTE — Progress Notes (Signed)
  Subjective:    Patient ID: Matthew Prince, male    DOB: 1956/03/18, 56 y.o.   MRN: [id number]  HPI 56 year old patient who has a history of type 2 diabetes. No recent hemoglobin A1c. Random blood sugar this afternoon to 79. He has dyslipidemia which has been controlled on atorvastatin. No new concerns or complaints.    Review of Systems  Constitutional: Negative for fever, chills, appetite change and fatigue.  HENT: Negative for hearing loss, ear pain, congestion, sore throat, trouble swallowing, neck stiffness, dental problem, voice change and tinnitus.   Eyes: Negative for pain, discharge and visual disturbance.  Respiratory: Negative for cough, chest tightness, wheezing and stridor.   Cardiovascular: Negative for chest pain, palpitations and leg swelling.  Gastrointestinal: Negative for nausea, vomiting, abdominal pain, diarrhea, constipation, blood in stool and abdominal distention.  Genitourinary: Negative for urgency, hematuria, flank pain, discharge, difficulty urinating and genital sores.  Musculoskeletal: Negative for myalgias, back pain, joint swelling, arthralgias and gait problem.  Skin: Negative for rash.  Neurological: Negative for dizziness, syncope, speech difficulty, weakness, numbness and headaches.  Hematological: Negative for adenopathy. Does not bruise/bleed easily.  Psychiatric/Behavioral: Negative for behavioral problems and dysphoric mood. The patient is not nervous/anxious.        Objective:   Physical Exam  Constitutional: He appears well-developed and well-nourished. No distress.       Blood pressure well controlled          Assessment & Plan:   Diabetes mellitus. And a blood sugar 279. We'll check a hemoglobin A1c. Will likely require additional medication Dyslipidemia stable

## 2011-12-04 ENCOUNTER — Other Ambulatory Visit: Payer: Self-pay | Admitting: Internal Medicine

## 2011-12-04 MED ORDER — GLIMEPIRIDE 2 MG PO TABS: 2.0000 mg | ORAL_TABLET | Freq: Every day | ORAL | Status: DC

## 2011-12-04 NOTE — Progress Notes (Signed)
Quick Note:  Spoke with pt- informed of lab results and new med to start - [facility]. ______

## 2011-12-04 NOTE — Progress Notes (Signed)
Quick Note:  Attempt to call hm - no ans no mach , attmept to call cell# - VM - LMTCB to discuss lab results and new med to start ______

## 2011-12-25 ENCOUNTER — Other Ambulatory Visit: Payer: Self-pay | Admitting: Internal Medicine

## 2012-03-01 ENCOUNTER — Ambulatory Visit (INDEPENDENT_AMBULATORY_CARE_PROVIDER_SITE_OTHER): Payer: Managed Care, Other (non HMO) | Admitting: Internal Medicine

## 2012-03-01 ENCOUNTER — Encounter: Payer: Self-pay | Admitting: Internal Medicine

## 2012-03-01 VITALS — BP 112/70 | Temp 98.1°F | Wt 180.0 lb

## 2012-03-01 DIAGNOSIS — E785 Hyperlipidemia, unspecified: Secondary | ICD-10-CM

## 2012-03-01 DIAGNOSIS — E119 Type 2 diabetes mellitus without complications: Secondary | ICD-10-CM

## 2012-03-01 LAB — HEMOGLOBIN A1C: Hgb A1c MFr Bld: 9.2 % — ABNORMAL HIGH (ref 4.6–6.5)

## 2012-03-01 MED ORDER — METFORMIN HCL 1000 MG PO TABS: 1000.0000 mg | ORAL_TABLET | Freq: Two times a day (BID) | ORAL | Status: DC

## 2012-03-01 MED ORDER — ATORVASTATIN CALCIUM 40 MG PO TABS: 40.0000 mg | ORAL_TABLET | Freq: Every day | ORAL | Status: DC

## 2012-03-01 MED ORDER — GLIMEPIRIDE 2 MG PO TABS: 2.0000 mg | ORAL_TABLET | Freq: Every day | ORAL | Status: DC

## 2012-03-01 NOTE — Progress Notes (Signed)
  Subjective:    Patient ID: Matthew Prince, male    DOB: 1956/01/17, 56 y.o.   MRN: [id number]  HPI  56 year old patient who has type 2 diabetes. He was seen here 3 months ago and hemoglobin A1c had increased to greater than 10. He was placed on Amaryl 2 mg daily which she took for 30 days. He has been off this medication for the past 60 days. He was unaware that he was to take this medication daily. He says fasting blood sugars are generally in the 140-145 range. Remains on atorvastatin for dyslipidemia. Past Medical History  Diagnosis Date  . Hyperlipidemia   . Allergy   . Diabetes mellitus   . Low back pain   . Benign positional vertigo 10/2009    History   Social History  . Marital Status: Married    Spouse Name: N/A    Number of Children: N/A  . Years of Education: N/A   Occupational History  . Not on file.   Social History Main Topics  . Smoking status: Never Smoker   . Smokeless tobacco: Never Used  . Alcohol Use: No  . Drug Use: Not on file  . Sexually Active: Not on file   Other Topics Concern  . Not on file   Social History Narrative  . No narrative on file    Past Surgical History  Procedure Date  . Neck surgery     resection benign tumor    No family history on file.  Allergies  Allergen Reactions  . Iodine     REACTION: unspecified    Current Outpatient Prescriptions on File Prior to Visit  Medication Sig Dispense Refill  . DISCONTD: atorvastatin (LIPITOR) 40 MG tablet TAKE 1 TABLET BY MOUTH ONCE DAILY  90 tablet  6  . DISCONTD: metFORMIN (GLUCOPHAGE) 1000 MG tablet TAKE 1 TABLET BY MOUTH TWICE A DAY WITH A MEAL  180 tablet  6    BP 112/70  Temp 98.1 F (36.7 C) (Oral)  Wt 180 lb (81.647 kg)        Review of Systems  Constitutional: Negative for fever, chills, appetite change and fatigue.  HENT: Negative for hearing loss, ear pain, congestion, sore throat, trouble swallowing, neck stiffness, dental problem, voice change and tinnitus.     Eyes: Negative for pain, discharge and visual disturbance.  Respiratory: Negative for cough, chest tightness, wheezing and stridor.   Cardiovascular: Negative for chest pain, palpitations and leg swelling.  Gastrointestinal: Negative for nausea, vomiting, abdominal pain, diarrhea, constipation, blood in stool and abdominal distention.  Genitourinary: Negative for urgency, hematuria, flank pain, discharge, difficulty urinating and genital sores.  Musculoskeletal: Negative for myalgias, back pain, joint swelling, arthralgias and gait problem.  Skin: Negative for rash.  Neurological: Negative for dizziness, syncope, speech difficulty, weakness, numbness and headaches.  Hematological: Negative for adenopathy. Does not bruise/bleed easily.  Psychiatric/Behavioral: Negative for behavioral problems and dysphoric mood. The patient is not nervous/anxious.        Objective:   Physical Exam  Constitutional: He appears well-developed and well-nourished. No distress.       Blood pressure low normal          Assessment & Plan:  Diabetes mellitus. Will check a hemoglobin A1c is greater than 7 we'll resume Amaryl Dyslipidemia stable  Weight loss exercise all encouraged Recheck 3 months

## 2012-03-01 NOTE — Progress Notes (Signed)
  Subjective:    Patient ID: Matthew Prince, male    DOB: 06/13/1955, 56 y.o.   MRN: [id number]  HPI  Wt Readings from Last 3 Encounters:  03/01/12 180 lb (81.647 kg)  12/01/11 183 lb (83.008 kg)  08/31/11 185 lb (83.915 kg)    Review of Systems     Objective:   Physical Exam        Assessment & Plan:

## 2012-03-01 NOTE — Patient Instructions (Signed)
Please check your hemoglobin A1c every 3 months  Limit your sodium (Salt) intake    It is important that you exercise regularly, at least 20 minutes 3 to 4 times per week.  If you develop chest pain or shortness of breath seek  medical attention.   

## 2012-03-04 ENCOUNTER — Telehealth: Payer: Self-pay | Admitting: Internal Medicine

## 2012-03-04 NOTE — Telephone Encounter (Signed)
Caller: [name]/Patient; Patient Name: Matthew Prince; PCP: Eleonore Chiquito [facility]); Best Callback Phone Number: [phone]. Pt calling in regard to glimepiride (AMARYL) 2 MG tablet script.  Pt went to pick up script and he only received a 30 day supply vs a 90 day as  written on the script.  Verified last encounter and saw the script for 90 days.  Script was ended on 10/04.      PLEASE FOLLOW WITH PT/PHARMACY IN REGARD TO SCRIPT.  Thank you.

## 2012-03-05 NOTE — Telephone Encounter (Signed)
Called [facility] - spoke with Florentina Addison - his ins will only pay for 30 day rx - if pt wants 90 day they can fill he just needs to let them know Spoke with pt- informed

## 2012-05-31 ENCOUNTER — Encounter: Payer: Self-pay | Admitting: Internal Medicine

## 2012-05-31 ENCOUNTER — Ambulatory Visit (INDEPENDENT_AMBULATORY_CARE_PROVIDER_SITE_OTHER): Payer: Managed Care, Other (non HMO) | Admitting: Internal Medicine

## 2012-05-31 VITALS — BP 130/80 | HR 88 | Temp 98.1°F | Resp 18 | Wt 189.0 lb

## 2012-05-31 DIAGNOSIS — E785 Hyperlipidemia, unspecified: Secondary | ICD-10-CM

## 2012-05-31 DIAGNOSIS — E119 Type 2 diabetes mellitus without complications: Secondary | ICD-10-CM

## 2012-05-31 MED ORDER — GLIMEPIRIDE 2 MG PO TABS: 4.0000 mg | ORAL_TABLET | Freq: Every day | ORAL | Status: DC

## 2012-05-31 NOTE — Progress Notes (Signed)
Subjective:    Patient ID: Matthew Prince, male    DOB: 1955-10-31, 57 y.o.   MRN: [id number]  HPI  57 year old patient who has type 2 diabetes and dyslipidemia. He is seen today for his three-month followup. Hemoglobin A1c's have been quite elevated over the past year. One year ago hemoglobin A1c was less than 7 but was 9.23 months ago. He has been on Amaryl 2 mg daily as well as metformin 2 g daily in divided dosages. He states that his blood sugar has been a bit more elevated during the Christmas holidays.  Past Medical History  Diagnosis Date  . Hyperlipidemia   . Allergy   . Diabetes mellitus   . Low back pain   . Benign positional vertigo 10/2009    History   Social History  . Marital Status: Married    Spouse Name: N/A    Number of Children: N/A  . Years of Education: N/A   Occupational History  . Not on file.   Social History Main Topics  . Smoking status: Never Smoker   . Smokeless tobacco: Never Used  . Alcohol Use: No  . Drug Use: Not on file  . Sexually Active: Not on file   Other Topics Concern  . Not on file   Social History Narrative  . No narrative on file    Past Surgical History  Procedure Date  . Neck surgery     resection benign tumor    No family history on file.  Allergies  Allergen Reactions  . Iodine     REACTION: unspecified    Current Outpatient Prescriptions on File Prior to Visit  Medication Sig Dispense Refill  . atorvastatin (LIPITOR) 40 MG tablet Take 1 tablet (40 mg total) by mouth daily.  90 tablet  6  . metFORMIN (GLUCOPHAGE) 1000 MG tablet Take 1 tablet (1,000 mg total) by mouth 2 (two) times daily with a meal.  180 tablet  6    BP 130/80  Pulse 88  Temp 98.1 F (36.7 C) (Oral)  Resp 18  Wt 189 lb (85.73 kg)  SpO2 97%       Review of Systems  Constitutional: Negative for fever, chills, appetite change and fatigue.  HENT: Negative for hearing loss, ear pain, congestion, sore throat, trouble swallowing, neck  stiffness, dental problem, voice change and tinnitus.   Eyes: Negative for pain, discharge and visual disturbance.  Respiratory: Negative for cough, chest tightness, wheezing and stridor.   Cardiovascular: Negative for chest pain, palpitations and leg swelling.  Gastrointestinal: Negative for nausea, vomiting, abdominal pain, diarrhea, constipation, blood in stool and abdominal distention.  Genitourinary: Negative for urgency, hematuria, flank pain, discharge, difficulty urinating and genital sores.  Musculoskeletal: Negative for myalgias, back pain, joint swelling, arthralgias and gait problem.  Skin: Negative for rash.  Neurological: Negative for dizziness, syncope, speech difficulty, weakness, numbness and headaches.  Hematological: Negative for adenopathy. Does not bruise/bleed easily.  Psychiatric/Behavioral: Negative for behavioral problems and dysphoric mood. The patient is not nervous/anxious.        Objective:   Physical Exam  Constitutional: He is oriented to person, place, and time. He appears well-developed.  HENT:  Head: Normocephalic.  Right Ear: External ear normal.  Left Ear: External ear normal.  Eyes: Conjunctivae normal and EOM are normal.  Neck: Normal range of motion.  Cardiovascular: Normal rate and normal heart sounds.   Pulmonary/Chest: Breath sounds normal.  Abdominal: Bowel sounds are normal.  Musculoskeletal: Normal range of  motion. He exhibits no edema and no tenderness.  Neurological: He is alert and oriented to person, place, and time.  Psychiatric: He has a normal mood and affect. His behavior is normal.          Assessment & Plan:   Diabetes mellitus. Will check a hemoglobin A1c. We'll likely need additional medication we'll increase Amaryl to 4 mg daily. Will consider GLP 1 agonist

## 2012-05-31 NOTE — Patient Instructions (Signed)
Please check your hemoglobin A1c every 3 months    It is important that you exercise regularly, at least 20 minutes 3 to 4 times per week.  If you develop chest pain or shortness of breath seek  medical attention.   

## 2012-06-01 LAB — HEMOGLOBIN A1C
Hgb A1c MFr Bld: 11.2 % — ABNORMAL HIGH (ref <5.7)
Mean Plasma Glucose: 275 mg/dL — ABNORMAL HIGH (ref <117)

## 2012-06-04 ENCOUNTER — Ambulatory Visit (INDEPENDENT_AMBULATORY_CARE_PROVIDER_SITE_OTHER): Payer: Managed Care, Other (non HMO) | Admitting: Internal Medicine

## 2012-06-04 ENCOUNTER — Encounter: Payer: Self-pay | Admitting: Internal Medicine

## 2012-06-04 VITALS — BP 150/86 | HR 87 | Temp 98.6°F | Resp 18 | Wt 190.0 lb

## 2012-06-04 DIAGNOSIS — E119 Type 2 diabetes mellitus without complications: Secondary | ICD-10-CM

## 2012-06-04 MED ORDER — GLUCOSE BLOOD VI STRP: ORAL_STRIP | Status: DC

## 2012-06-04 MED ORDER — LIRAGLUTIDE 18 MG/3ML ~~LOC~~ SOLN: 0.6000 mg | Freq: Every day | SUBCUTANEOUS | Status: DC

## 2012-06-04 NOTE — Progress Notes (Signed)
  Subjective:    Patient ID: Matthew Prince, male    DOB: 1956/05/18, 57 y.o.   MRN: [id number]  HPI  57 year old patient who is seen today for followup of his poorly controlled diabetes. Recent hemoglobin A1c was greater than 11. The patient's diabetes is quite responsive to lifestyle issues. In the past his hemoglobin A1c has been in excess of 12 but has improved to less than 7 with better diet. He has seen endocrinology in the past and briefly has been on insulin therapy which he wishes to avoid. Diabetic management discussed at length. He has had dietary counseling in the past and additional information was dispensed today    Review of Systems  Constitutional: Negative for fever, chills, appetite change and fatigue.  HENT: Negative for hearing loss, ear pain, congestion, sore throat, trouble swallowing, neck stiffness, dental problem, voice change and tinnitus.   Eyes: Negative for pain, discharge and visual disturbance.  Respiratory: Negative for cough, chest tightness, wheezing and stridor.   Cardiovascular: Negative for chest pain, palpitations and leg swelling.  Gastrointestinal: Negative for nausea, vomiting, abdominal pain, diarrhea, constipation, blood in stool and abdominal distention.  Genitourinary: Negative for urgency, hematuria, flank pain, discharge, difficulty urinating and genital sores.  Musculoskeletal: Negative for myalgias, back pain, joint swelling, arthralgias and gait problem.  Skin: Negative for rash.  Neurological: Negative for dizziness, syncope, speech difficulty, weakness, numbness and headaches.  Hematological: Negative for adenopathy. Does not bruise/bleed easily.  Psychiatric/Behavioral: Negative for behavioral problems and dysphoric mood. The patient is not nervous/anxious.        Objective:   Physical Exam  Constitutional: He appears well-developed and well-nourished. No distress.       Weight 190  Random blood sugar 194          Assessment & Plan:     Diabetes mellitus. Will start Victoza 0.6 mg daily for 1 week and then up titrate to 1.2 mg for an additional 2 weeks with a final dose of 1.8 mg. Lifestyle issues discussed Dietary counseling dispensed Recheck 3 months with hemoglobin A1c

## 2012-06-04 NOTE — Patient Instructions (Addendum)
Please check your hemoglobin A1c every 3 months  Victoza 0.6 mg daily for one week, then increase to 1.2 mg daily for 2 weeks, then 1.8 mg [id number] Calorie Diet for Diabetes Meal Planning The 1800 calorie diet is designed for eating up to 1800 calories each day. Following this diet and making healthy meal choices can help improve overall health. This diet controls blood sugar (glucose) levels and can also help lower blood pressure and cholesterol. SERVING SIZES Measuring foods and serving sizes helps to make sure you are getting the right amount of food. The list below tells how big or small some common serving sizes are:  1 oz.........4 stacked dice.   3 oz........Marland KitchenDeck of cards.   1 tsp.......Marland KitchenTip of little finger.   1 tbs......Marland KitchenMarland KitchenThumb.   2 tbs.......Marland KitchenGolf ball.    cup......Marland KitchenHalf of a fist.   1 cup.......Marland KitchenA fist.  GUIDELINES FOR CHOOSING FOODS The goal of this diet is to eat a variety of foods and limit calories to 1800 each day. This can be done by choosing foods that are low in calories and fat. The diet also suggests eating small amounts of food frequently. Doing this helps control your blood glucose levels so they do not get too high or too low. Each meal or snack may include a protein food source to help you feel more satisfied and to stabilize your blood glucose. Try to eat about the same amount of food around the same time each day. This includes weekend days, travel days, and days off work. Space your meals about 4 to 5 hours apart and add a snack between them if you wish.   For example, a daily food plan could include breakfast, a morning snack, lunch, dinner, and an evening snack. Healthy meals and snacks include whole grains, vegetables, fruits, lean meats, poultry, fish, and dairy products. As you plan your meals, select a variety of foods. Choose from the bread and starch, vegetable, fruit, dairy, and meat/protein groups. Examples of foods from each group and their suggested  serving sizes are listed below. Use measuring cups and spoons to become familiar with what a healthy portion looks like. Bread and Starch Each serving equals 15 grams of carbohydrates.  1 slice bread.    bagel.    cup cold cereal (unsweetened).    cup hot cereal or mashed potatoes.   1 small potato (size of a computer mouse).   cup cooked pasta or rice.    English muffin.   1 cup broth-based soup.   3 cups of popcorn.   4 to 6 whole-wheat crackers.    cup cooked beans, peas, or corn.  Vegetable Each serving equals 5 grams of carbohydrates.   cup cooked vegetables.   1 cup raw vegetables.    cup tomato or vegetable juice.  Fruit Each serving equals 15 grams of carbohydrates.  1 small apple or orange.   1 cup watermelon or strawberries.    cup applesauce (no sugar added).   2 tbs raisins.    banana.    cup canned fruit, packed in water, its own juice, or sweetened with a sugar substitute.    cup unsweetened fruit juice.  Dairy Each serving equals 12 to 15 grams of carbohydrates.  1 cup fat-free milk.   6 oz artificially sweetened yogurt or plain yogurt.   1 cup low-fat buttermilk.   1 cup soy milk.   1 cup almond milk.  Meat/Protein  1 large egg.   2 to 3 oz  meat, poultry, or fish.    cup low-fat cottage cheese.   1 tbs peanut butter.   1 oz low-fat cheese.    cup tuna in water.    cup tofu.  Fat  1 tsp oil.   1 tsp trans-fat-free margarine.   1 tsp butter.   1 tsp mayonnaise.   2 tbs avocado.   1 tbs salad dressing.   1 tbs cream cheese.   2 tbs sour cream.  SAMPLE 1800 CALORIE DIET PLAN Breakfast   cup unsweetened cereal (1 carb serving).   1 cup fat-free milk (1 carb serving).   1 slice whole-wheat toast (1 carb serving).    small banana (1 carb serving).   1 scrambled egg.   1 tsp trans-fat-free margarine.  Lunch  Tuna sandwich.   2 slices whole-wheat bread (2 carb servings).    cup  canned tuna in water, drained.   1 tbs reduced fat mayonnaise.   1 stalk celery, chopped.   2 slices tomato.   1 lettuce leaf.   1 cup carrot sticks.   24 to 30 seedless grapes (2 carb servings).   6 oz light yogurt (1 carb serving).  Afternoon Snack  3 graham cracker squares (1 carb serving).   Fat-free milk, 1 cup (1 carb serving).   1 tbs peanut butter.  Dinner  3 oz salmon, broiled with 1 tsp oil.   1 cup mashed potatoes (2 carb servings) with 1 tsp trans-fat-free margarine.   1 cup fresh or frozen green beans.   1 cup steamed asparagus.   1 cup fat-free milk (1 carb serving).  Evening Snack  3 cups air-popped popcorn (1 carb serving).   2 tbs parmesan cheese sprinkled on top.  MEAL PLAN Use this worksheet to help you make a daily meal plan based on the 1800 calorie diet suggestions. If you are using this plan to help you control your blood glucose, you may interchange carbohydrate-containing foods (dairy, starches, and fruits). Select a variety of fresh foods of varying colors and flavors. The total amount of carbohydrate in your meals or snacks is more important than making sure you include all of the food groups every time you eat. Choose from the following foods to build your day's meals:  8 Starches.   4 Vegetables.   3 Fruits.   2 Dairy.   6 to 7 oz Meat/Protein.   Up to 4 Fats.  Your dietician can use this worksheet to help you decide how many servings and which types of foods are right for you. BREAKFAST Food Group and Servings / Food Choice Starch ________________________________________________________ Dairy _________________________________________________________ Fruit _________________________________________________________ Meat/Protein __________________________________________________ Fat ___________________________________________________________ LUNCH Food Group and Servings / Food Choice Starch  ________________________________________________________ Meat/Protein __________________________________________________ Vegetable _____________________________________________________ Fruit _________________________________________________________ Dairy _________________________________________________________ Fat ___________________________________________________________ Aura Fey Food Group and Servings / Food Choice Starch ________________________________________________________ Meat/Protein __________________________________________________ Fruit __________________________________________________________ Dairy _________________________________________________________ Laural Golden Food Group and Servings / Food Choice Starch _________________________________________________________ Meat/Protein ___________________________________________________ Dairy __________________________________________________________ Vegetable ______________________________________________________ Fruit ___________________________________________________________ Fat ____________________________________________________________ Lollie Sails Food Group and Servings / Food Choice Fruit __________________________________________________________ Meat/Protein ___________________________________________________ Dairy __________________________________________________________ Starch _________________________________________________________ DAILY TOTALS Starch ____________________________ Vegetable _________________________ Fruit _____________________________ Dairy _____________________________ Meat/Protein______________________ Fat _______________________________ Document Released: 12/05/2004 Document Revised: 08/07/2011 Document Reviewed: 03/31/2011 [employer] Patient Information 2013 [place], [place].   Diabetes and Exercise Regular exercise is important and can help:    Control blood glucose (sugar).    Decrease blood pressure.     Control blood lipids (cholesterol, triglycerides).   Improve overall  health.  BENEFITS FROM EXERCISE  Improved fitness.   Improved flexibility.   Improved endurance.   Increased bone density.   Weight control.   Increased muscle strength.   Decreased body fat.   Improvement of the body's use of insulin, a hormone.   Increased insulin sensitivity.   Reduction of insulin needs.   Reduced stress and tension.   Helps you feel better.  People with diabetes who add exercise to their lifestyle gain additional benefits, including:  Weight loss.   Reduced appetite.   Improvement of the body's use of blood glucose.   Decreased risk factors for heart disease:   Lowering of cholesterol and triglycerides.   Raising the level of good cholesterol (high-density lipoproteins, HDL).   Lowering blood sugar.   Decreased blood pressure.  TYPE 1 DIABETES AND EXERCISE  Exercise will usually lower your blood glucose.   If blood glucose is greater than 240 mg/dl, check urine ketones. If ketones are present, do not exercise.   Location of the insulin injection sites may need to be adjusted with exercise. Avoid injecting insulin into areas of the body that will be exercised. For example, avoid injecting insulin into:   The arms when playing tennis.   The legs when jogging. For more information, discuss this with your caregiver.   Keep a record of:   Food intake.   Type and amount of exercise.   Expected peak times of insulin action.   Blood glucose levels.  Do this before, during, and after exercise. Review your records with your caregiver. This will help you to develop guidelines for adjusting food intake and insulin amounts.   TYPE 2 DIABETES AND EXERCISE  Regular physical activity can help control blood glucose.   Exercise is important because it may:   Increase the body's sensitivity to insulin.   Improve blood glucose control.    Exercise reduces the risk of heart disease. It decreases serum cholesterol and triglycerides. It also lowers blood pressure.   Those who take insulin or oral hypoglycemic agents should watch for signs of hypoglycemia. These signs include dizziness, shaking, sweating, chills, and confusion.   Body water is lost during exercise. It must be replaced. This will help to avoid loss of body fluids (dehydration) or heat stroke.  Be sure to talk to your caregiver before starting an exercise program to make sure it is safe for you. Remember, any activity is better than none.   Document Released: 08/05/2003 Document Revised: 08/07/2011 Document Reviewed: 11/19/2008 [facility] Patient Information 2013 [place], [place].   Diabetes Meal Planning Guide The diabetes meal planning guide is a tool to help you plan your meals and snacks. It is important for people with diabetes to manage their blood glucose (sugar) levels. Choosing the right foods and the right amounts throughout your day will help control your blood glucose. Eating right can even help you improve your blood pressure and reach or maintain a healthy weight. CARBOHYDRATE COUNTING MADE EASY When you eat carbohydrates, they turn to sugar. This raises your blood glucose level. Counting carbohydrates can help you control this level so you feel better. When you plan your meals by counting carbohydrates, you can have more flexibility in what you eat and balance your medicine with your food intake. Carbohydrate counting simply means adding up the total amount of carbohydrate grams in your meals and snacks. Try to eat about the same amount at each meal. Foods with carbohydrates are listed below. Each portion below is 1 carbohydrate  serving or 15 grams of carbohydrates. Ask your dietician how many grams of carbohydrates you should eat at each meal or snack. Grains and Starches  1 slice bread.    English muffin or hotdog/hamburger bun.    cup cold cereal  (unsweetened).   cup cooked pasta or rice.    cup starchy vegetables (corn, potatoes, peas, beans, winter squash).   1 tortilla (6 inches).    bagel.   1 waffle or pancake (size of a CD).    cup cooked cereal.   4 to 6 small crackers.  *Whole grain is recommended. Fruit  1 cup fresh unsweetened berries, melon, papaya, pineapple.   1 small fresh fruit.    banana or mango.    cup fruit juice (4 oz unsweetened).    cup canned fruit in natural juice or water.   2 tbs dried fruit.   12 to 15 grapes or cherries.  Milk and Yogurt  1 cup fat-free or 1% milk.   1 cup soy milk.   6 oz light yogurt with sugar-free sweetener.   6 oz low-fat soy yogurt.   6 oz plain yogurt.  Vegetables  1 cup raw or  cup cooked is counted as 0 carbohydrates or a "free" food.   If you eat 3 or more servings at 1 meal, count them as 1 carbohydrate serving.  Other Carbohydrates   oz chips or pretzels.    cup ice cream or frozen yogurt.    cup sherbet or sorbet.   2 inch square cake, no frosting.   1 tbs honey, sugar, jam, jelly, or syrup.   2 small cookies.   3 squares of graham crackers.   3 cups popcorn.   6 crackers.   1 cup broth-based soup.   Count 1 cup casserole or other mixed foods as 2 carbohydrate servings.   Foods with less than 20 calories in a serving may be counted as 0 carbohydrates or a "free" food.  You may want to purchase a book or computer software that lists the carbohydrate gram counts of different foods. In addition, the nutrition facts panel on the labels of the foods you eat are a good source of this information. The label will tell you how big the serving size is and the total number of carbohydrate grams you will be eating per serving. Divide this number by 15 to obtain the number of carbohydrate servings in a portion. Remember, 1 carbohydrate serving equals 15 grams of carbohydrate. SERVING SIZES Measuring foods and serving sizes helps  you make sure you are getting the right amount of food. The list below tells how big or small some common serving sizes are.  1 oz.........4 stacked dice.   3 oz........Marland KitchenDeck of cards.   1 tsp.......Marland KitchenTip of little finger.   1 tbs......Marland KitchenMarland KitchenThumb.   2 tbs.......Marland KitchenGolf ball.    cup......Marland KitchenHalf of a fist.   1 cup.......Marland KitchenA fist.  SAMPLE DIABETES MEAL PLAN Below is a sample meal plan that includes foods from the grain and starches, dairy, vegetable, fruit, and meat groups. A dietician can individualize a meal plan to fit your calorie needs and tell you the number of servings needed from each food group. However, controlling the total amount of carbohydrates in your meal or snack is more important than making sure you include all of the food groups at every meal. You may interchange carbohydrate containing foods (dairy, starches, and fruits). The meal plan below is an example of a 2000 calorie diet using  carbohydrate counting. This meal plan has 17 carbohydrate servings. Breakfast  1 cup oatmeal (2 carb servings).    cup light yogurt (1 carb serving).   1 cup blueberries (1 carb serving).    cup almonds.  Snack  1 large apple (2 carb servings).   1 low-fat string cheese stick.  Lunch  Chicken breast salad.   1 cup spinach.    cup chopped tomatoes.   2 oz chicken breast, sliced.   2 tbs low-fat [place] dressing.   12 whole-wheat crackers (2 carb servings).   12 to 15 grapes (1 carb serving).   1 cup low-fat milk (1 carb serving).  Snack  1 cup carrots.    cup hummus (1 carb serving).  Dinner  3 oz broiled salmon.   1 cup brown rice (3 carb servings).  Snack  1  cups steamed broccoli (1 carb serving) drizzled with 1 tsp olive oil and lemon juice.   1 cup light pudding (2 carb servings).  DIABETES MEAL PLANNING WORKSHEET Your dietician can use this worksheet to help you decide how many servings of foods and what types of foods are right for you.    BREAKFAST Food Group and Servings / Carb Servings Grain/Starches __________________________________ Dairy __________________________________________ Vegetable ______________________________________ Fruit ___________________________________________ Meat __________________________________________ Fat ____________________________________________ LUNCH Food Group and Servings / Carb Servings Grain/Starches ___________________________________ Dairy ___________________________________________ Fruit ____________________________________________ Meat ___________________________________________ Fat _____________________________________________ Laural Golden Food Group and Servings / Carb Servings Grain/Starches ___________________________________ Dairy ___________________________________________ Fruit ____________________________________________ Meat ___________________________________________ Fat _____________________________________________ SNACKS Food Group and Servings / Carb Servings Grain/Starches ___________________________________ Dairy ___________________________________________ Vegetable _______________________________________ Fruit ____________________________________________ Meat ___________________________________________ Fat _____________________________________________ DAILY TOTALS Starches _________________________ Vegetable ________________________ Fruit ____________________________ Dairy ____________________________ Meat ____________________________ Fat ______________________________ Document Released: 02/09/2005 Document Revised: 08/07/2011 Document Reviewed: 12/21/2008 [employer] Patient Information 2013 [place], [place].   Diabetes, Eating Away From Home Sometimes, you might eat in a restaurant or have meals that are prepared by someone else. You can enjoy eating out. However, the portions in restaurants may be much larger than needed. Listed below are some ideas to help  you choose foods that will keep your blood glucose (sugar) in better control.   TIPS FOR EATING OUT  Know your meal plan and how many carbohydrate servings you should have at each meal. You may wish to carry a copy of your meal plan in your purse or wallet. Learn the foods included in each food group.   Make a list of restaurants near you that offer healthy choices. Take a copy of the carry-out menus to see what they offer. Then, you can plan what you will order ahead of time.   Become familiar with serving sizes by practicing them at home using measuring cups and spoons. Once you learn to recognize portion sizes, you will be able to correctly estimate the amount of total carbohydrate you are allowed to eat at the restaurant. Ask for a takeout box if the portion is more than you should have. When your food comes, leave the amount you should have on the plate, and put the rest in the takeout box before you start eating.   Plan ahead if your mealtime will be different from usual. Check with your caregiver to find out how to time meals and medicine if you are taking insulin.   Avoid high-fat foods, such as fried foods, cream sauces, high-fat salad dressings, or any added butter or margarine.   Do not be afraid to ask questions. Ask your server about the portion size, cooking  methods, ingredients and if items can be substituted. Restaurants do not list all available items on the menu. You can ask for your main entree to be prepared using skim milk, oil instead of butter or margarine, and without gravy or sauces. Ask your waiter or waitress to serve salad dressings, gravy, sauces, margarine, and sour cream on the side. You can then add the amount your meal plan suggests.   Add more vegetables whenever possible.   Avoid items that are labeled "jumbo," "giant," "deluxe," or "supersized."   You may want to split an entre with someone and order an extra side salad.   Watch for hidden calories in foods  like croutons, bacon, or cheese.   Ask your server to take away the bread basket or chips from your table.   Order a dinner salad as an appetizer.  You can eat most foods served in a restaurant. Some foods are better choices than others. Breads and Starches  Recommended: All kinds of bread (wheat, rye, white, oatmeal, [place], [place], raisin), hard or soft dinner rolls, frankfurter or hamburger buns, small bagels, small corn or whole-wheat flour tortillas.   Avoid: Frosted or glazed breads, butter rolls, egg or cheese breads, croissants, sweet rolls, pastries, coffee cake, glazed or frosted doughnuts, muffins.  Crackers  Recommended: Animal crackers, graham, rye, saltine, oyster, and matzoth crackers. Bread sticks, melba toast, rusks, pretzels, popcorn (without fat), zwieback toast.   Avoid: High-fat snack crackers or chips. Buttered popcorn.  Cereals  Recommended: Hot and cold cereals. Whole grains such as oatmeal or shredded wheat are good choices.   Avoid: Sugar-coated or granola type cereals.  Potatoes/Pasta/Rice/Beans  Recommended: Order baked, boiled, or mashed potatoes, rice or noodles without added fat, whole beans. Order gravies, butter, margarine, or sauces on the side so you can control the amount you add.   Avoid: Hash browns or fried potatoes. Potatoes, pasta, or rice prepared with cream or cheese sauce. Potato or pasta salads prepared with large amounts of dressing. Fried beans or fried rice.  Vegetables  Recommended: Order steamed, baked, boiled, or stewed vegetables without sauces or extra fat. Ask that sauce be served on the side. If vegetables are not listed on the menu, ask what is available.   Avoid: Vegetables prepared with cream, butter, or cheese sauce. Fried vegetables.  Salad Bars  Recommended: Many of the vegetables at a salad bar are considered "free." Use lemon juice, vinegar, or low-calorie salad dressing (fewer than 20 calories per serving) as "free"  dressings for your salad. Look for salad bar ingredients that have no added fat or sugar such as tomatoes, lettuce, cucumbers, broccoli, carrots, onions, and mushrooms.   Avoid: Prepared salads with large amounts of dressing, such as coleslaw, caesar salad, macaroni salad, bean salad, or carrot salad.  Fruit  Recommended: Eat fresh fruit or fresh fruit salad without added dressing. A salad bar often offers fresh fruit choices, but canned fruit at a restaurant is usually packed in sugar or syrup.   Avoid: Sweetened canned or frozen fruits, plain or sweetened fruit juice. Fruit salads with dressing, sour cream, or sugar added to them.  Meat and Meat Substitutes  Recommended: Order broiled, baked, roasted, or grilled meat, poultry, or fish. Trim off all visible fat. Do not eat the skin of poultry. The size stated on the menu is the raw weight. Meat shrinks by  in cooking (for example, 4 oz raw equals 3 oz cooked meat).   Avoid: Deep-fat fried meat, poultry, or  fish. Breaded meats.  Eggs  Recommended: Order soft, hard-cooked, poached, or scrambled eggs. Omelets may be okay, depending on what ingredients are added. Egg substitutes are also a good choice.   Avoid: Fried eggs, eggs prepared with cream or cheese sauce.  Milk  Recommended: Order low-fat or fat-free milk according to your meal plan. Plain, nonfat yogurt or flavored yogurt with no sugar added may be used as a substitute for milk. Soy milk may also be used.   Avoid: Milk shakes or sweetened milk beverages.  Soups and Combination Foods  Recommended: Clear broth or consomm are "free" foods and may be used as an appetizer. Broth-based soups with fat removed count as a starch serving and are preferred over cream soups. Soups made with beans or split peas may be eaten but count as a starch.   Avoid: Fatty soups, soup made with cream, cheese soup. Combination foods prepared with excessive amounts of fat or with cream or cheese sauces.    Desserts and Sweets  Recommended: Ask for fresh fruit. Sponge or angel food cake without icing, ice milk, no sugar added ice cream, sherbet, or frozen yogurt may fit into your meal plan occasionally.   Avoid: Pastries, puddings, pies, cakes with icing, custard, gelatin desserts.  Fats and Oils  Recommended: Choose healthy fats such as olive oil, canola oil, or tub margarine, reduced fat or fat-free sour cream, cream cheese, avocado, or nuts.   Avoid: Any fats in excess of your allowed portion. Deep-fried foods or any food with a large amount of fat.  Note: Ask for all fats to be served on the side, and limit your portion sizes according to your meal plan. Document Released: 05/15/2005 Document Revised: 08/07/2011 Document Reviewed: 12/03/2008 [facility] Patient Information 2013 [place], [place].   Diabetes, Type 2 Diabetes is a long-lasting (chronic) disease. In type 2 diabetes, the pancreas does not make enough insulin (a hormone), and the body does not respond normally to the insulin that is made. This type of diabetes was also previously called adult-onset diabetes. It usually occurs after the age of 42, but it can occur at any age.   CAUSES   Type 2 diabetes happens because the pancreas is not making enough insulin or your body has trouble using the insulin that your pancreas does make properly. SYMPTOMS    Drinking more than usual.   Urinating more than usual.   Blurred vision.   Dry, itchy skin.   Frequent infections.   Feeling more tired than usual (fatigue).  DIAGNOSIS The diagnosis of type 2 diabetes is usually made by one of the following tests:  Fasting blood glucose test. You will not eat for at least 8 hours and then take a blood test.   Random blood glucose test. Your blood glucose (sugar) is checked at any time of the day regardless of when you ate.   Oral glucose tolerance test (OGTT). Your blood glucose is measured after you have not eaten (fasted) and then after  you drink a glucose containing beverage.  TREATMENT    Healthy eating.   Exercise.   Medicine, if needed.   Monitoring blood glucose.   Seeing your caregiver regularly.  HOME CARE INSTRUCTIONS    Check your blood glucose at least once a day. More frequent monitoring may be necessary, depending on your medicines and on how well your diabetes is controlled. Your caregiver will advise you.   Take your medicine as directed by your caregiver.   Do not smoke.  Make wise food choices. Ask your caregiver for information. Weight loss can improve your diabetes.   Learn about low blood glucose (hypoglycemia) and how to treat it.   Get your eyes checked regularly.   Have a yearly physical exam. Have your blood pressure checked and your blood and urine tested.   Wear a pendant or bracelet saying that you have diabetes.   Check your feet every night for cuts, sores, blisters, and redness. Let your caregiver know if you have any problems.  SEEK MEDICAL CARE IF:    You have problems keeping your blood glucose in target range.   You have problems with your medicines.   You have symptoms of an illness that do not improve after 24 hours.   You have a sore or wound that is not healing.   You notice a change in vision or a new problem with your vision.   You have a fever.  MAKE SURE YOU:  Understand these instructions.   Will watch your condition.   Will get help right away if you are not doing well or get worse.  Document Released: 05/15/2005 Document Revised: 08/07/2011 Document Reviewed: 10/31/2010 [facility] Patient Information 2013 [place], [place].   Diets for Diabetes, Food Labeling Look at food labels to help you decide how much of a product you can eat. You will want to check the amount of total carbohydrate in a serving to see how the food fits into your meal plan. In the list of ingredients, the ingredient present in the largest amount by weight must be listed first,  followed by the other ingredients in descending order. STANDARD OF IDENTITY Most products have a list of ingredients. However, foods that the Food and Drug Administration (FDA) has given a standard of identity do not need a list of ingredients. A standard of identity means that a food must contain certain ingredients if it is called a particular name. Examples are mayonnaise, peanut butter, ketchup, jelly, and cheese. LABELING TERMS There are many terms found on food labels. Some of these terms have specific definitions. Some terms are regulated by the FDA, and the FDA has clearly specified how they can be used. Others are not regulated or well-defined and can be misleading and confusing. SPECIFICALLY DEFINED TERMS Nutritive Sweetener.  A sweetener that contains calories,such as table sugar or honey.  Nonnutritive Sweetener.  A sweetener with few or no calories,such as saccharin, aspartame, sucralose, and cyclamate.  LABELING TERMS REGULATED BY THE FDA Free.  The product contains only a tiny or small amount of fat, cholesterol, sodium, sugar, or calories. For example, a "fat-free" product will contain less than 0.5 g of fat per serving.  Low.  A food described as "low" in fat, saturated fat, cholesterol, sodium, or calories could be eaten fairly often without exceeding dietary guidelines. For example, "low in fat" means no more than 3 g of fat per serving.  Lean.  "Lean" and "extra lean" are U.S. Department of Agriculture Architect) terms for use on meat and poultry products. "Lean" means the product contains less than 10 g of fat, 4 g of saturated fat, and 95 mg of cholesterol per serving. "Lean" is not as low in fat as a product labeled "low."  Extra Lean.  "Extra lean" means the product contains less than 5 g of fat, 2 g of saturated fat, and 95 mg of cholesterol per serving. While "extra lean" has less fat than "lean," it is still higher in fat than a  product labeled "low."  Reduced, Less,  Fewer.  A diet product that contains 25% less of a nutrient or calories than the regular version. For example, hot dogs might be labeled "25% less fat than our regular hot dogs."  Light/Lite.  A diet product that contains  fewer calories or  the fat of the original. For example, "light in sodium" means a product with  the usual sodium.  More.  One serving contains at least 10% more of the daily value of a vitamin, mineral, or fiber than usual.  Good Source Of.  One serving contains 10% to 19% of the daily value for a particular vitamin, mineral, or fiber.  Excellent Source Of.  One serving contains 20% or more of the daily value for a particular nutrient. Other terms used might be "high in" or "rich in."  Enriched or Fortified.  The product contains added vitamins, minerals, or protein. Nutrition labeling must be used on enriched or fortified foods.  Imitation.  The product has been altered so that it is lower in protein, vitamins, or minerals than the usual food,such as imitation peanut butter.  Total Fat.  The number listed is the total of all fat found in a serving of the product. Under total fat, food labels must list saturated fat and trans fat, which are associated with raising bad cholesterol and an increased risk of heart blood vessel disease.  Saturated Fat.  Mainly fats from animal-based sources. Some examples are red meat, cheese, cream, whole milk, and coconut oil.  Trans Fat.  Found in some fried snack foods, packaged foods, and fried restaurant foods. It is recommended you eat as close to 0 g of trans fat as possible, since it raises bad cholesterol and lowers good cholesterol.  Polyunsaturated and Monounsaturated Fats.  More healthful fats. These fats are from plant sources.  Total Carbohydrate.  The number of carbohydrate grams in a serving of the product. Under total carbohydrate are listed the other carbohydrate sources, such as dietary fiber and sugars.    Dietary Fiber.  A carbohydrate from plant sources.  Sugars.  Sugars listed on the label contain all naturally occurring sugars as well as added sugars.  LABELING TERMS NOT REGULATED BY THE FDA Sugarless.  Table sugar (sucrose) has not been added. However, the manufacturer may use another form of sugar in place of sucrose to sweeten the product. For example, sugar alcohols are used to sweeten foods. Sugar alcohols are a form of sugar but are not table sugar. If a product contains sugar alcohols in place of sucrose, it can still be labeled "sugarless."  Low Salt, Salt-Free, Unsalted, No Salt, No Salt Added, Without Added Salt.  Food that is usually processed with salt has been made without salt. However, the food may contain sodium-containing additives, such as preservatives, leavening agents, or flavorings.  Natural.  This term has no legal meaning.  Organic.  Foods that are certified as organic have been inspected and approved by the USDA to ensure they are produced without pesticides, fertilizers containing synthetic ingredients, bioengineering, or ionizing radiation.  Document Released: 05/18/2003 Document Revised: 08/07/2011 Document Reviewed: 12/03/2008 [facility] Patient Information 2013 [place], [place].

## 2012-06-13 ENCOUNTER — Telehealth: Payer: Self-pay | Admitting: Internal Medicine

## 2012-06-13 NOTE — Telephone Encounter (Signed)
Patient called stating that his insurance would not pay for his Liraglutide 18 MG/3ML SOLN and he would like to know if he can have samples of this until he can get an approval from the insurance company. Please assist.

## 2012-06-13 NOTE — Telephone Encounter (Signed)
Spoke to pt told him I do have sample of Matthew Prince he can come by and pick up tomorrow. Pt verbalized understanding and stated will be her around 3:00 pm. Told pt fine just ask for me. Pt verbalized understanding.

## 2012-07-09 ENCOUNTER — Telehealth: Payer: Self-pay | Admitting: Internal Medicine

## 2012-07-09 NOTE — Telephone Encounter (Signed)
Left message on voicemail.

## 2012-07-09 NOTE — Telephone Encounter (Signed)
I do not know what patient has been doing about the Victoza for the last few weeks, but I finally was able to get an approval for it from [facility] today. They've had some "system issues". If patient is still in need, rx can be sent to [facility] I believe.

## 2012-07-13 NOTE — Telephone Encounter (Signed)
Left message Rx for Victoza was approved and is at pharmacy for pt.

## 2012-08-30 ENCOUNTER — Ambulatory Visit (INDEPENDENT_AMBULATORY_CARE_PROVIDER_SITE_OTHER): Payer: Managed Care, Other (non HMO) | Admitting: Internal Medicine

## 2012-08-30 ENCOUNTER — Encounter: Payer: Self-pay | Admitting: Internal Medicine

## 2012-08-30 VITALS — BP 140/90 | HR 88 | Temp 98.2°F | Resp 20 | Wt 190.0 lb

## 2012-08-30 DIAGNOSIS — E785 Hyperlipidemia, unspecified: Secondary | ICD-10-CM

## 2012-08-30 DIAGNOSIS — E119 Type 2 diabetes mellitus without complications: Secondary | ICD-10-CM

## 2012-08-30 MED ORDER — SAXAGLIPTIN HCL 5 MG PO TABS: 5.0000 mg | ORAL_TABLET | Freq: Every day | ORAL | Status: DC

## 2012-08-30 NOTE — Progress Notes (Signed)
  Subjective:    Patient ID: Matthew Prince, male    DOB: 04-05-1956, 57 y.o.   MRN: [id number]  HPI  BP Readings from Last 3 Encounters:  08/30/12 140/90  06/04/12 150/86  05/31/12 130/80    Review of Systems     Objective:   Physical Exam        Assessment & Plan:

## 2012-08-30 NOTE — Progress Notes (Signed)
Subjective:    Patient ID: Matthew Prince, male    DOB: 03/17/1956, 57 y.o.   MRN: [id number]  HPI  57 year old patient who is seen today for followup of type 2 diabetes. He has treated dyslipidemia. 3 months ago he was placed on Victoza that this was discontinued after 2 weeks to 2 of nausea and dizziness. He has been on oral therapy only. Prior to the injectable medication hemoglobin A1c was 11.  Past Medical History  Diagnosis Date  . Hyperlipidemia   . Allergy   . Diabetes mellitus   . Low back pain   . Benign positional vertigo 10/2009    History   Social History  . Marital Status: Married    Spouse Name: N/A    Number of Children: N/A  . Years of Education: N/A   Occupational History  . Not on file.   Social History Main Topics  . Smoking status: Never Smoker   . Smokeless tobacco: Never Used  . Alcohol Use: No  . Drug Use: Not on file  . Sexually Active: Not on file   Other Topics Concern  . Not on file   Social History Narrative  . No narrative on file    Past Surgical History  Procedure Laterality Date  . Neck surgery      resection benign tumor    History reviewed. No pertinent family history.  Allergies  Allergen Reactions  . Iodine     REACTION: unspecified    Current Outpatient Prescriptions on File Prior to Visit  Medication Sig Dispense Refill  . atorvastatin (LIPITOR) 40 MG tablet Take 1 tablet (40 mg total) by mouth daily.  90 tablet  6  . glimepiride (AMARYL) 2 MG tablet Take 2 tablets (4 mg total) by mouth daily before breakfast.  90 tablet  4  . glucose blood test strip Check glucose level before each meal  ONE TOUCH VERIO TEST STRIP  100 each  12  . metFORMIN (GLUCOPHAGE) 1000 MG tablet Take 1 tablet (1,000 mg total) by mouth 2 (two) times daily with a meal.  180 tablet  6   No current facility-administered medications on file prior to visit.    BP 140/90  Pulse 88  Temp(Src) 98.2 F (36.8 C) (Oral)  Resp 20  Wt 190 lb (86.183  kg)  BMI 28.9 kg/m2  SpO2 97%       Review of Systems  Constitutional: Negative for fever, chills, appetite change and fatigue.  HENT: Negative for hearing loss, ear pain, congestion, sore throat, trouble swallowing, neck stiffness, dental problem, voice change and tinnitus.   Eyes: Negative for pain, discharge and visual disturbance.  Respiratory: Negative for cough, chest tightness, wheezing and stridor.   Cardiovascular: Negative for chest pain, palpitations and leg swelling.  Gastrointestinal: Negative for nausea, vomiting, abdominal pain, diarrhea, constipation, blood in stool and abdominal distention.  Genitourinary: Negative for urgency, hematuria, flank pain, discharge, difficulty urinating and genital sores.  Musculoskeletal: Negative for myalgias, back pain, joint swelling, arthralgias and gait problem.  Skin: Negative for rash.  Neurological: Negative for dizziness, syncope, speech difficulty, weakness, numbness and headaches.  Hematological: Negative for adenopathy. Does not bruise/bleed easily.  Psychiatric/Behavioral: Negative for behavioral problems and dysphoric mood. The patient is not nervous/anxious.        Objective:   Physical Exam  Constitutional: He is oriented to person, place, and time. He appears well-developed.  Blood pressure 140/90  HENT:  Head: Normocephalic.  Right Ear: External ear  normal.  Left Ear: External ear normal.  Eyes: Conjunctivae and EOM are normal.  Neck: Normal range of motion.  Cardiovascular: Normal rate and normal heart sounds.   Pulmonary/Chest: Breath sounds normal.  Abdominal: Bowel sounds are normal.  Musculoskeletal: Normal range of motion. He exhibits no edema and no tenderness.  Neurological: He is alert and oriented to person, place, and time.  Psychiatric: He has a normal mood and affect. His behavior is normal.          Assessment & Plan:   Diabetes mellitus. Will check a hemoglobin A1c and give him a trial of   onglyza-  Will start with a 2.5 mg daily dose and up titrate to 5 mg. Lifestyle issues discussed. He wishes to avoid insulin if possible. Blood pressure is high normal. Blood pressure has been trending up. We'll continue to watch closely. Dyslipidemia

## 2012-08-30 NOTE — Patient Instructions (Signed)
Limit your sodium (Salt) intake  Please check your blood pressure on a regular basis.  If it is consistently greater than 150/90, please make an office appointment.    It is important that you exercise regularly, at least 20 minutes 3 to 4 times per week.  If you develop chest pain or shortness of breath seek  medical attention.   Please check your hemoglobin A1c every 3 months  

## 2012-08-31 LAB — HEMOGLOBIN A1C
Hgb A1c MFr Bld: 8.1 % — ABNORMAL HIGH (ref <5.7)
Mean Plasma Glucose: 186 mg/dL — ABNORMAL HIGH (ref <117)

## 2012-11-29 ENCOUNTER — Encounter (HOSPITAL_COMMUNITY): Payer: Self-pay | Admitting: *Deleted

## 2012-11-29 ENCOUNTER — Ambulatory Visit (HOSPITAL_COMMUNITY): Admit: 2012-11-29 | Payer: Self-pay | Admitting: Cardiology

## 2012-11-29 ENCOUNTER — Inpatient Hospital Stay (HOSPITAL_COMMUNITY): Payer: Managed Care, Other (non HMO)

## 2012-11-29 ENCOUNTER — Encounter (HOSPITAL_COMMUNITY)
Admission: EM | Disposition: A | Discharge status: Home / Self Care | Payer: Self-pay | Source: Home / Self Care | Attending: Cardiology

## 2012-11-29 ENCOUNTER — Inpatient Hospital Stay (HOSPITAL_COMMUNITY)
Admission: EM | Admit: 2012-11-29 | Discharge: 2012-12-03 | DRG: 247 | Disposition: A | Discharge status: Home / Self Care | Payer: Managed Care, Other (non HMO) | Attending: Cardiology | Admitting: Cardiology

## 2012-11-29 ENCOUNTER — Emergency Department (HOSPITAL_COMMUNITY): Payer: Managed Care, Other (non HMO)

## 2012-11-29 DIAGNOSIS — E1169 Type 2 diabetes mellitus with other specified complication: Secondary | ICD-10-CM | POA: Diagnosis present

## 2012-11-29 DIAGNOSIS — R112 Nausea with vomiting, unspecified: Secondary | ICD-10-CM | POA: Diagnosis present

## 2012-11-29 DIAGNOSIS — Z955 Presence of coronary angioplasty implant and graft: Secondary | ICD-10-CM

## 2012-11-29 DIAGNOSIS — I255 Ischemic cardiomyopathy: Secondary | ICD-10-CM

## 2012-11-29 DIAGNOSIS — I2589 Other forms of chronic ischemic heart disease: Secondary | ICD-10-CM | POA: Diagnosis present

## 2012-11-29 DIAGNOSIS — I2510 Atherosclerotic heart disease of native coronary artery without angina pectoris: Secondary | ICD-10-CM

## 2012-11-29 DIAGNOSIS — I219 Acute myocardial infarction, unspecified: Secondary | ICD-10-CM

## 2012-11-29 DIAGNOSIS — I4729 Other ventricular tachycardia: Secondary | ICD-10-CM | POA: Diagnosis not present

## 2012-11-29 DIAGNOSIS — E785 Hyperlipidemia, unspecified: Secondary | ICD-10-CM

## 2012-11-29 DIAGNOSIS — E118 Type 2 diabetes mellitus with unspecified complications: Secondary | ICD-10-CM | POA: Diagnosis present

## 2012-11-29 DIAGNOSIS — I472 Ventricular tachycardia, unspecified: Secondary | ICD-10-CM | POA: Diagnosis not present

## 2012-11-29 DIAGNOSIS — Z79899 Other long term (current) drug therapy: Secondary | ICD-10-CM

## 2012-11-29 DIAGNOSIS — I2119 ST elevation (STEMI) myocardial infarction involving other coronary artery of inferior wall: Secondary | ICD-10-CM

## 2012-11-29 DIAGNOSIS — I213 ST elevation (STEMI) myocardial infarction of unspecified site: Secondary | ICD-10-CM | POA: Diagnosis present

## 2012-11-29 DIAGNOSIS — E119 Type 2 diabetes mellitus without complications: Secondary | ICD-10-CM

## 2012-11-29 DIAGNOSIS — I2109 ST elevation (STEMI) myocardial infarction involving other coronary artery of anterior wall: Principal | ICD-10-CM | POA: Diagnosis present

## 2012-11-29 HISTORY — DX: ST elevation (STEMI) myocardial infarction involving other coronary artery of inferior wall: I21.19

## 2012-11-29 HISTORY — DX: Presence of coronary angioplasty implant and graft: Z95.5

## 2012-11-29 HISTORY — DX: Ischemic cardiomyopathy: I25.5

## 2012-11-29 HISTORY — PX: CORONARY ANGIOPLASTY WITH STENT PLACEMENT: SHX49

## 2012-11-29 HISTORY — PX: PERCUTANEOUS CORONARY STENT INTERVENTION (PCI-S): SHX5485

## 2012-11-29 HISTORY — PX: LEFT HEART CATHETERIZATION WITH CORONARY ANGIOGRAM: SHX5451

## 2012-11-29 LAB — CBC WITH DIFFERENTIAL/PLATELET
Basophils Relative: 0 % (ref 0–1)
Eosinophils Relative: 2 % (ref 0–5)
HCT: 40 % (ref 39.0–52.0)
Hemoglobin: 13.2 g/dL (ref 13.0–17.0)
Lymphocytes Relative: 48 % — ABNORMAL HIGH (ref 12–46)
Neutrophils Relative %: 42 % — ABNORMAL LOW (ref 43–77)
RBC: 4.57 MIL/uL (ref 4.22–5.81)

## 2012-11-29 LAB — POCT I-STAT TROPONIN I: Troponin i, poc: 0 ng/mL (ref 0.00–0.08)

## 2012-11-29 LAB — TROPONIN I: Troponin I: 0.51 ng/mL (ref <0.30)

## 2012-11-29 LAB — POCT I-STAT, CHEM 8
BUN: 14 mg/dL (ref 6–23)
Chloride: 107 mEq/L (ref 96–112)
Potassium: 3.6 mEq/L (ref 3.5–5.1)
Sodium: 142 mEq/L (ref 135–145)

## 2012-11-29 SURGERY — LEFT HEART CATHETERIZATION WITH CORONARY ANGIOGRAM
Anesthesia: LOCAL

## 2012-11-29 MED ORDER — POTASSIUM CHLORIDE CRYS ER 20 MEQ PO TBCR: 40.0000 meq | EXTENDED_RELEASE_TABLET | Freq: Once | ORAL | Status: AC

## 2012-11-29 MED ORDER — MORPHINE SULFATE 4 MG/ML IJ SOLN
4.0000 mg | Freq: Once | INTRAMUSCULAR | Status: AC
  Administered 2012-11-29: 4 mg via INTRAVENOUS
  Filled 2012-11-29: qty 1

## 2012-11-29 MED ORDER — ONDANSETRON HCL 4 MG/2ML IJ SOLN
INTRAMUSCULAR | Status: AC
  Filled 2012-11-29: qty 2

## 2012-11-29 MED ORDER — ATORVASTATIN CALCIUM 40 MG PO TABS
40.0000 mg | ORAL_TABLET | Freq: Every day | ORAL | Status: DC
  Administered 2012-11-29 – 2012-12-03 (×5): 40 mg via ORAL
  Filled 2012-11-29 (×5): qty 1

## 2012-11-29 MED ORDER — NITROGLYCERIN IN D5W 200-5 MCG/ML-% IV SOLN: 10.0000 ug/min | INTRAVENOUS | Status: DC

## 2012-11-29 MED ORDER — SODIUM CHLORIDE 0.9 % IV SOLN: 250.0000 mL | INTRAVENOUS | Status: DC | PRN

## 2012-11-29 MED ORDER — METHYLPREDNISOLONE SODIUM SUCC 125 MG IJ SOLR
INTRAMUSCULAR | Status: AC
  Filled 2012-11-29: qty 2

## 2012-11-29 MED ORDER — NITROGLYCERIN IN D5W 200-5 MCG/ML-% IV SOLN
INTRAVENOUS | Status: AC
  Filled 2012-11-29: qty 250

## 2012-11-29 MED ORDER — INSULIN ASPART 100 UNIT/ML ~~LOC~~ SOLN
0.0000 [IU] | Freq: Three times a day (TID) | SUBCUTANEOUS | Status: DC
  Administered 2012-11-29: 3 [IU] via SUBCUTANEOUS
  Administered 2012-11-30: 2 [IU] via SUBCUTANEOUS
  Administered 2012-11-30: 1 [IU] via SUBCUTANEOUS
  Administered 2012-12-01 – 2012-12-02 (×2): 2 [IU] via SUBCUTANEOUS
  Administered 2012-12-02 – 2012-12-03 (×2): 1 [IU] via SUBCUTANEOUS

## 2012-11-29 MED ORDER — SODIUM CHLORIDE 0.9 % IV SOLN: INTRAVENOUS | Status: AC

## 2012-11-29 MED ORDER — LISINOPRIL 2.5 MG PO TABS
2.5000 mg | ORAL_TABLET | Freq: Every day | ORAL | Status: DC
  Administered 2012-11-29 – 2012-12-03 (×5): 2.5 mg via ORAL
  Filled 2012-11-29 (×5): qty 1

## 2012-11-29 MED ORDER — MORPHINE SULFATE 2 MG/ML IJ SOLN: 2.0000 mg | INTRAMUSCULAR | Status: DC | PRN

## 2012-11-29 MED ORDER — METHYLPREDNISOLONE SODIUM SUCC 125 MG IJ SOLR
125.0000 mg | Freq: Once | INTRAMUSCULAR | Status: AC
  Administered 2012-11-29: 125 mg via INTRAVENOUS

## 2012-11-29 MED ORDER — CARVEDILOL 3.125 MG PO TABS
3.1250 mg | ORAL_TABLET | Freq: Two times a day (BID) | ORAL | Status: DC
  Administered 2012-11-29: 3.125 mg via ORAL
  Filled 2012-11-29 (×2): qty 1

## 2012-11-29 MED ORDER — ASPIRIN 81 MG PO CHEW: 324.0000 mg | CHEWABLE_TABLET | Freq: Once | ORAL | Status: AC

## 2012-11-29 MED ORDER — MIDAZOLAM HCL 2 MG/2ML IJ SOLN
INTRAMUSCULAR | Status: AC
  Filled 2012-11-29: qty 2

## 2012-11-29 MED ORDER — HEPARIN SODIUM (PORCINE) 5000 UNIT/ML IJ SOLN: 60.0000 [IU]/kg | Freq: Once | INTRAMUSCULAR | Status: DC

## 2012-11-29 MED ORDER — SODIUM CHLORIDE 0.9 % IJ SOLN: 3.0000 mL | INTRAMUSCULAR | Status: DC | PRN

## 2012-11-29 MED ORDER — TICAGRELOR 90 MG PO TABS
ORAL_TABLET | ORAL | Status: AC
  Filled 2012-11-29: qty 2

## 2012-11-29 MED ORDER — SODIUM CHLORIDE 0.9 % IV SOLN
0.2500 mg/kg/h | INTRAVENOUS | Status: AC
  Administered 2012-11-29: 0.25 mg/kg/h via INTRAVENOUS
  Filled 2012-11-29: qty 250

## 2012-11-29 MED ORDER — HEPARIN (PORCINE) IN NACL 2-0.9 UNIT/ML-% IJ SOLN
INTRAMUSCULAR | Status: AC
  Filled 2012-11-29: qty 1000

## 2012-11-29 MED ORDER — POTASSIUM CHLORIDE CRYS ER 20 MEQ PO TBCR
EXTENDED_RELEASE_TABLET | ORAL | Status: AC
  Administered 2012-11-29: 40 meq via ORAL
  Filled 2012-11-29: qty 2

## 2012-11-29 MED ORDER — HEPARIN SODIUM (PORCINE) 5000 UNIT/ML IJ SOLN
4000.0000 [IU] | Freq: Once | INTRAMUSCULAR | Status: AC
  Administered 2012-11-29: 4000 [IU] via INTRAVENOUS
  Filled 2012-11-29: qty 1

## 2012-11-29 MED ORDER — ONDANSETRON HCL 4 MG/2ML IJ SOLN: 4.0000 mg | Freq: Four times a day (QID) | INTRAMUSCULAR | Status: DC | PRN

## 2012-11-29 MED ORDER — CARVEDILOL 6.25 MG PO TABS
6.2500 mg | ORAL_TABLET | Freq: Two times a day (BID) | ORAL | Status: DC
  Administered 2012-11-30 – 2012-12-03 (×7): 6.25 mg via ORAL
  Filled 2012-11-29 (×9): qty 1

## 2012-11-29 MED ORDER — LIDOCAINE HCL (PF) 1 % IJ SOLN
INTRAMUSCULAR | Status: AC
  Filled 2012-11-29: qty 30

## 2012-11-29 MED ORDER — LINAGLIPTIN 5 MG PO TABS
5.0000 mg | ORAL_TABLET | Freq: Every day | ORAL | Status: DC
  Administered 2012-11-29 – 2012-12-03 (×5): 5 mg via ORAL
  Filled 2012-11-29 (×5): qty 1

## 2012-11-29 MED ORDER — GLIMEPIRIDE 4 MG PO TABS
4.0000 mg | ORAL_TABLET | Freq: Every day | ORAL | Status: DC
  Administered 2012-11-30 – 2012-12-02 (×3): 4 mg via ORAL
  Filled 2012-11-29 (×4): qty 1

## 2012-11-29 MED ORDER — ATROPINE SULFATE 1 MG/ML IJ SOLN
INTRAMUSCULAR | Status: AC
  Filled 2012-11-29: qty 1

## 2012-11-29 MED ORDER — METOPROLOL TARTRATE 1 MG/ML IV SOLN
5.0000 mg | Freq: Once | INTRAVENOUS | Status: AC
  Administered 2012-11-29: 5 mg via INTRAVENOUS
  Filled 2012-11-29: qty 5

## 2012-11-29 MED ORDER — CARVEDILOL 3.125 MG PO TABS
3.1250 mg | ORAL_TABLET | Freq: Once | ORAL | Status: AC
  Administered 2012-11-29: 3.125 mg via ORAL
  Filled 2012-11-29: qty 1

## 2012-11-29 MED ORDER — NITROGLYCERIN 0.2 MG/ML ON CALL CATH LAB
INTRAVENOUS | Status: AC
  Filled 2012-11-29: qty 1

## 2012-11-29 MED ORDER — ACETAMINOPHEN 325 MG PO TABS: 650.0000 mg | ORAL_TABLET | ORAL | Status: DC | PRN

## 2012-11-29 MED ORDER — FENTANYL CITRATE 0.05 MG/ML IJ SOLN
INTRAMUSCULAR | Status: AC
  Filled 2012-11-29: qty 2

## 2012-11-29 MED ORDER — ASPIRIN 81 MG PO CHEW
CHEWABLE_TABLET | ORAL | Status: AC
  Administered 2012-11-29: 324 mg via ORAL
  Filled 2012-11-29: qty 4

## 2012-11-29 MED ORDER — BIVALIRUDIN 250 MG IV SOLR
INTRAVENOUS | Status: AC
  Filled 2012-11-29: qty 250

## 2012-11-29 MED ORDER — MIDAZOLAM HCL 2 MG/2ML IJ SOLN
1.0000 mg | Freq: Once | INTRAMUSCULAR | Status: AC
  Administered 2012-11-29: 1 mg via INTRAVENOUS

## 2012-11-29 MED ORDER — ONDANSETRON HCL 4 MG/2ML IJ SOLN
4.0000 mg | Freq: Once | INTRAMUSCULAR | Status: AC
  Administered 2012-11-29: 4 mg via INTRAVENOUS

## 2012-11-29 MED ORDER — SODIUM CHLORIDE 0.9 % IJ SOLN
3.0000 mL | Freq: Two times a day (BID) | INTRAMUSCULAR | Status: DC
  Administered 2012-11-29: 3 mL via INTRAVENOUS
  Administered 2012-11-30: 10:00:00 via INTRAVENOUS
  Administered 2012-11-30 – 2012-12-02 (×4): 3 mL via INTRAVENOUS

## 2012-11-29 NOTE — ED Notes (Signed)
Per EMS ago pt was outside washing car- came inside pt c/o nausea, weakness, pressure in chest, pale and diaphoretic. Pt unable to rate pain. Anterior, lateral infarct. Has had 324 ASA. Nitro x3

## 2012-11-29 NOTE — Progress Notes (Signed)
Right femoral artery sheath pulled. Manual pressure held for 30 mins, hemostasis achieved. Vital signs stable during removal (see flowsheet). Guaze pressure dressing applied. Bil pedal pulses palpable 2+. Right groin level 0 before pull and after. Post site care explained and demonstrated to patient. Pt demonstrated post site care back. Explained post BR x4 hours to patient, verbalized understanding. No distress noted. Will monitor.

## 2012-11-29 NOTE — CV Procedure (Signed)
CARDIAC CATHETERIZATION AND PERCUTANEOUS CORONARY INTERVENTION REPORT  NAME:  Matthew Prince   MRN: [id number] DOB:  [birth year]   ADMIT DATE: 11/29/2012 Procedure Date: 11/29/2012  INTERVENTIONAL CARDIOLOGIST: Marykay Lex, M.D., MS PRIMARY CARE PROVIDER: Rogelia Boga, MD PRIMARY CARDIOLOGIST: Marykay Lex, MD, MS  PATIENT:  Matthew Prince is a 57 y.o. male with PMD of DM-2 on Oral medication & HLD who was in USOH until early this afternoon when he developed severe L-sided CP & N/V with dyspnea & sense of impending doom.  EMS called -- ECG with Anterior & Inferolateral STE.  3 SL NTG & 324mg  ASA given.  Code STEMI called. I saw him on arrival to ED -- Noted ~8-9/10 L sided chest pressure & dyspnea -- felt like he was dying.  Then became nauseated with profuse emesis.  BPs were borderline - 90s-100s --> 500 ml NS bolus, 4 mg Morphine, 4 mg Zofran & 125 mg IV Solumedrol (reported Iodine Allergy) & IV Heparin 4000 Units given once IV Acceess restored.  324 mg ASA re-administered once emesis abated.  To ER 12:49. ECG with ~2 mm Anterior & Inferior (V3-5 & 1.5 mm II, III, aVF). Taken emergently to cardiac cath lab for PCI. Arrival @ Cath lab 1311.  PRE-OPERATIVE DIAGNOSIS:    Anterior-Inferior (with some lateral) STEMI  PROCEDURES PERFORMED:    LEFT HEART CATHETERIZATION WITH NATIVE CORONARY ANGIOGRAPHY  PERCUTANEOUS CORONARY INTERVENTION ON EARLY MID LAD (99% THROMBOTIC STENOSIS) - XIENCE eXPEDITION DES 3.0 MM X18 - (3.2 mm at edges, 3.4 mm mid stent)  PROCEDURE:Consent:  Risks of procedure as well as the alternatives and risks of each were explained to the (patient/caregiver).  Consent for procedure obtained., Unable to obtain consent because of emergent medical necessity. and Verbal consent was obtained.  Consent for signed by MD and patient with RN witness -- placed on chart.   PROCEDURE: The patient was brought to the 2nd Floor [facility] in the  fasting state and prepped and draped in the usual sterile fashion for Right groin or radial access. Sterile technique was used including antiseptics, cap, gloves, gown, hand hygiene, mask and sheet.  Skin prep: Chlorhexidine.  Time Out: Verified patient identification, verified procedure, site/side was marked, verified correct patient position, special equipment/implants available, medications/allergies/relevent history reviewed, required imaging and test results available.  Performed  Access: Right Common Femoral Artery; 6 Fr Sheath -- fluroscopically guided modified Seldinger technique (13:20)  Diagnostic:  JR4, XB 3.5, Angled Pigtail cathters advanced & exchanged over J-wire  Right Coronary Artery Angiography: JR4  Left Coronary Artery Angiography:  6 Fr XB 3.5 Guide  LV Hemodynamics (LV Gram): Angled Pigtail catheter  Sheath:  Sutured in place; to be removed 2 Hours after Angiomax gtt stopped; manual pressure hemostasis  MEDICATIONS:  Anesthesia:  Local Lidocaine 18 ml  Sedation:  2 mg IV Versed, 50 mcg IV fentanyl ;   Premedication: 1 mg IV Versed, 4 mg IV Morphine, 4 mg Zofran, 4000 Units IV Heparin; 500 ml NS bolus, 324mg  ASA  Omnipaque Contrast: 180 ml  Anticoagulation:  Angiomax Bolus & drip; decreased to "reduced rate" for 4 hr post PCI.  Anti-Platelet Agent:  Brilinta 180 mg  Hemodynamics:  Central Aortic / Mean Pressures: 122/71 mmHg; 94 mmHg  Left Ventricular Pressures / EDP: 120/12 mmHg; 28 mmHg  Left Ventriculography:  EF: 40-45 %  Wall Motion: mid to apical anterior, anteroapical & inferoapical hypokinesis  Coronary Anatomy:  Left Main: Large caliber vessel, bifurcates  into LAD & Circumflex. Angiographically normal. LAD: Large caliber vessel with a very proximal small Diag 1 (D1) and septal perforator followed by a 99% thrombotic lesion that spanned the distance to a second small Diag (D2).  There was TIMI 1 flow to the mid LAD, but TIMI 0 flow  distally.  D1: Small (~1.5 mm) caliber vessel that is the starting point of the LAD lesion, minimal plaque noted in the ostium.  Minimal luminal irregularities.  D2: Small ~1.5-40mm) bifurcating vessel with minimal luminal irregularities.  POST PCI: the LAD wraps around the apex with a apical diagonal branch.  Left Circumflex: Large caliber, non-dominant vessel that courses as a large, branching Lateral OM after giving off a small AV-groove branch.  The vessel reaches the inferolateral apex, bifurcating distally.   RCA: Large caliber, dominant vessel with several Atrial & RV marginal branches before bifurcating distally into the Right Posterior Descending Artery (rPDA) and the Right Posterior AV Groove Branch (RPAV).  Minimal luminal irregularities.  RPDA: Moderate to large caliber vessel with multiple septal perforators, that reaches ~2/3 of there way to the apex.  Minimal luminal irregularities.  RPL Sysytem:The RPAV begins as a moderate caliber vessel that gives off a bifurcating Right Posterolateral (RPL) 1 before terminating in a small to moderate caliber bifurcating RPL2. Minimal luminal irregularities.  With the guide catheter in place, and the clear culprit lesion being the early mid LAD lesion.  The decision was made to proceed with Urgent PCI.  Percutaneous Coronary Intervention:  Angiomax bolus administered & ACT checked  > 200 sec; Brilinta given. Guide: 6 Fr   XB 3.5 Guidewire: BMW Predilation Balloon: Enmerge MR 2.5  mm x 12 mm; (1337 hrs); DTB 48 min.  10 Atm x 15 Sec, 10Atm x 30 Se Stent: Xience eXpedition DES 3.0 mm x 18 mm;   Deployed at 16 Atm x 45 Sec, diameter ~3.2 mm Post-dilation Balloon: [place] Sprinter  3.25 mm x 15 mm;   18 Atm x 45 Sec,  Final Diameter: 3.4 mm  Post deployment angiography in multiple views, with and without guidewire in place revealed excellent stent deployment and lesion coverage.  There was no evidence of dissection or perforation.  TIMI 3  flow was restored after stent placement.  PATIENT DISPOSITION:    The patient was transferred to the PACU holding area in a hemodynamicaly stable, chest pain free condition.  The patient tolerated the procedure well, and there were no complications.  EBL:   < 20 ml  The patient was stable before, during, and after the procedure.  POST-OPERATIVE DIAGNOSIS:    Severe early mid LAD thrombotic subtotal stenosis with otherwise minimal CAD.  Successful PTCA-PCI of the LAD lesion with a Xience eXpedition DES 3.0 mm x 18 mm (postdilated to ~3.4 mm)  Moderately reduced LV function with expected Anterior, Apical & Inferoapical hypokinesis.  Mild LVEDP elevation.  PLAN OF CARE:  Standard post STEMI care -- Angiomax @ reduced rate x 4 hrs.  DAPT x minimum of 1 yr.  Continue Statin  Glycemic control - home meds, but hold metformin x 48 hr.  Will need to add BB.  Echo prior to d/c  Family updated.   Marykay Lex, M.D., M.S. [facility] [address] [place], [place]  [address]  [phone]  11/29/2012 2:29 PM

## 2012-11-29 NOTE — ED Notes (Addendum)
Pt actively vomiting- cardiology and dr. Silverio Lay met patient at bridge. Pt placed on zoll.

## 2012-11-29 NOTE — ED Provider Notes (Signed)
History    CSN: [id number] Arrival date & time 11/29/12  1246  First MD Initiated Contact with Patient 11/29/12 1301     Chief Complaint  Patient presents with  . Code STEMI   (Consider location/radiation/quality/duration/timing/severity/associated sxs/prior Treatment) The history is provided by the patient.  Val B Bellew is a 57 y.o. male hx of HL, DM here with chest pain. Substernal chest pain started about 45 minutes ago while he is outside washing his car. It is constant with no radiation. EMS arrived and EKG showed STEMI so code STEMI activated. No history of CAD or stents.   Past Medical History  Diagnosis Date  . Hyperlipidemia   . Allergy   . Diabetes mellitus   . Low back pain   . Benign positional vertigo 10/2009   Past Surgical History  Procedure Laterality Date  . Neck surgery      resection benign tumor   No family history on file. History  Substance Use Topics  . Smoking status: Never Smoker   . Smokeless tobacco: Never Used  . Alcohol Use: No    Review of Systems  Cardiovascular: Positive for chest pain.  All other systems reviewed and are negative.    Allergies  Iodine  Home Medications   Current Outpatient Rx  Name  Route  Sig  Dispense  Refill  . atorvastatin (LIPITOR) 40 MG tablet   Oral   Take 1 tablet (40 mg total) by mouth daily.   90 tablet   6   . glimepiride (AMARYL) 2 MG tablet   Oral   Take 2 tablets (4 mg total) by mouth daily before breakfast.   90 tablet   4   . glucose blood test strip      Check glucose level before each meal  ONE TOUCH VERIO TEST STRIP   100 each   12     Diagnosis: 250.00   . metFORMIN (GLUCOPHAGE) 1000 MG tablet   Oral   Take 1 tablet (1,000 mg total) by mouth 2 (two) times daily with a meal.   180 tablet   6   . saxagliptin HCl (ONGLYZA) 5 MG TABS tablet   Oral   Take 1 tablet (5 mg total) by mouth daily.   30 tablet   6    BP 108/71  Pulse 79  Temp(Src) 97.4 F (36.3 C) (Oral)   Resp 27  SpO2 100% Physical Exam  Nursing note and vitals reviewed. Constitutional:  Uncomfortable, vomiting   HENT:  Head: Normocephalic.  Mouth/Throat: Oropharynx is clear and moist.  Eyes: Conjunctivae are normal. Pupils are equal, round, and reactive to light.  Neck: Normal range of motion. Neck supple.  Cardiovascular: Normal rate, regular rhythm and normal heart sounds.   Pulmonary/Chest: Effort normal and breath sounds normal.  Bilateral breath sounds   Abdominal: Soft. Bowel sounds are normal. He exhibits no distension. There is no tenderness. There is no rebound.  Musculoskeletal: Normal range of motion. He exhibits no edema.  Neurological: He is alert.  Skin: Skin is warm and dry.  Psychiatric: He has a normal mood and affect. His behavior is normal. Judgment and thought content normal.    ED Course  Procedures (including critical care time) Labs Reviewed  CBC WITH DIFFERENTIAL   CRITICAL CARE Performed by: Silverio Lay, Rayvion Stumph   Total critical care time: 30 min   Critical care time was exclusive of separately billable procedures and treating other patients.  Critical care was necessary to treat  or prevent imminent or life-threatening deterioration.  Critical care was time spent personally by me on the following activities: development of treatment plan with patient and/or surrogate as well as nursing, discussions with consultants, evaluation of patient's response to treatment, examination of patient, obtaining history from patient or surrogate, ordering and performing treatments and interventions, ordering and review of laboratory studies, ordering and review of radiographic studies, pulse oximetry and re-evaluation of patient's condition.   No results found. No diagnosis found.   Date: 11/29/2012  Rate: 85  Rhythm: normal sinus rhythm  QRS Axis: normal  Intervals: normal  ST/T Wave abnormalities: STEMI inferiorly  Conduction Disutrbances:none  Narrative  Interpretation:   Old EKG Reviewed: none available    MDM  Markeese B Lamping is a 57 y.o. male here with chest pain. EKG showed STEMI. Started on nitro drip. Heparin bolus given. He is vomiting and has severe pain so zofran and morphine given. Dr. Herbie Baltimore at bedside and will take him to cath lab.    Richardean Canal, MD 11/29/12 [id number]

## 2012-11-29 NOTE — H&P (Signed)
I saw & examined the patient in the ED along with [name].  I agree with her findings & exam.    He was stabilized in the ER (see cath note) & taken directly to the cath lab for Anterolateral-Inferior STEMI.  Marykay Lex, M.D., M.S. [facility] [address] [place], [place]  [address]  [phone] Pager # [phone] 11/29/2012 4:11 PM

## 2012-11-29 NOTE — Progress Notes (Signed)
Pt had 13 beat run VT. Pt was asymptomatic. BP 130-140's/70's, HR 94-96. Dr. Herbie Baltimore notified and new orders obtained. Will continue to monitor.

## 2012-11-29 NOTE — ED Notes (Signed)
solumederol given

## 2012-11-29 NOTE — ED Notes (Signed)
4mg zofran given

## 2012-11-29 NOTE — Progress Notes (Signed)
ANTICOAGULATION CONSULT NOTE - Initial Consult  Pharmacy Consult for Angiomax Indication: Anticoagulation for 4 hours post-PCI  Allergies  Allergen Reactions  . Iodine     REACTION: unspecified    Patient Measurements: Height: 5\' 8"  (172.7 cm) IBW/kg (Calculated) : 68.4  Vital Signs: Temp: 97.2 F (36.2 C) (07/04 1441) Temp src: Oral (07/04 1441) BP: 106/66 mmHg (07/04 1306) Pulse Rate: 82 (07/04 1323)  Labs:  Recent Labs  11/29/12 1306 11/29/12 1310  HGB 14.3 13.2  HCT 42.0 40.0  PLT  --  250  CREATININE 1.00  --     The CrCl is unknown because both a height and weight (above a minimum accepted value) are required for this calculation.   Medical History: Past Medical History  Diagnosis Date  . Hyperlipidemia   . Allergy   . Diabetes mellitus   . Low back pain   . Benign positional vertigo 10/2009    Assessment: 57 y.o. M who had CP and SOB and was found to have a STEMI. The patient was sent urgently for cardiac cath and was found to severe mid-LAD stenosis s/p successful PCI. Per Dr. Herbie Baltimore, the patient is to continue on Angiomax at a reduced rate for 4 hours post-cath.   Goal of Therapy:  Appropriate post-cath anticoagulation and duration Monitor platelets by anticoagulation protocol: Yes   Plan:  1. Continue Angiomax at reduced rate of 0.25 mcg/kg/min until 1815 this evening (confirmed rate and stop time with nurse) 2. Will f/u with any additional anticoagulation needs.   Georgina Pillion, PharmD, BCPS Clinical Pharmacist Pager: [phone] 11/29/2012 3:20 PM

## 2012-11-29 NOTE — ED Notes (Addendum)
To cath lab with dr. Thomas Hoff RN. And Noreene Larsson EMT Pt. On zoll

## 2012-11-29 NOTE — Progress Notes (Addendum)
Pt having nonsustained runs of VT and couplets. Pt resting and asymptomatic. Dr. Gala Romney notified and orders received. Will monitor.

## 2012-11-29 NOTE — H&P (Signed)
Chief Complaint: STEMI  HPI: The patient is a 57 y/o male, with no known prior cardiac history, who presents to [facility] with acute anterolateral STEMI. Based on chart review the patient's primary care provider is Dr. Annett Gula. He has documented non insulin dependent DM and treated hyperlipidemia. His last office visit with is PCP was on 08/30/2012 and at that time his Hgb A1c was 8.1 (down from 11.2 in 05/2012).  He was transported urgently by EMS to the ER after he called 911 with a complaint of chest pain. Apparently, he was at home washing his car when he developed severe substernal chest pain with associated bilateral arm weakness, SOB, diaphoresis, n/v. When EMS arrived, he was on the floor but responsive. An EKG demonstrated ST elevations in the anterolateral leads. Code STEMI was activated. On arrival the patient had profuse vomiting and continued to endorse severe chest pain and weakness.   Past Medical History  Diagnosis Date  . Hyperlipidemia   . Allergy   . Diabetes mellitus   . Low back pain   . Benign positional vertigo 10/2009    Past Surgical History  Procedure Laterality Date  . Neck surgery      resection benign tumor    Family History: Not obtained due to acuity of condition    Social History:  reports that he has never smoked. He has never used smokeless tobacco. He reports that he does not drink alcohol. His drug history is not on file.  Allergies:  Allergies  Allergen Reactions  . Iodine     REACTION: unspecified    Prior to Admission medications   Medication Sig Start Date End Date Taking? Authorizing Provider  atorvastatin (LIPITOR) 40 MG tablet Take 1 tablet (40 mg total) by mouth daily. 03/01/12   Gordy Savers, MD  glimepiride (AMARYL) 2 MG tablet Take 2 tablets (4 mg total) by mouth daily before breakfast. 05/31/12 05/31/13  Gordy Savers, MD  glucose blood test strip Check glucose level before each meal  ONE TOUCH VERIO TEST STRIP 06/04/12    Gordy Savers, MD  metFORMIN (GLUCOPHAGE) 1000 MG tablet Take 1 tablet (1,000 mg total) by mouth 2 (two) times daily with a meal. 03/01/12   Gordy Savers, MD  saxagliptin HCl (ONGLYZA) 5 MG TABS tablet Take 1 tablet (5 mg total) by mouth daily. 08/30/12   Gordy Savers, MD    No results found for this or any previous visit (from the past 48 hour(s)). No results found.  Review of Systems  Unable to perform ROS Constitutional: Positive for malaise/fatigue and diaphoresis.  Respiratory: Positive for shortness of breath.   Cardiovascular: Positive for chest pain.  Gastrointestinal: Positive for nausea and vomiting.  Neurological: Positive for weakness.  All other systems reviewed and are negative.    SpO2 94.00%. Physical Exam  Constitutional: He is oriented to person, place, and time. He appears well-developed and well-nourished. He appears distressed.  Cardiovascular: Normal rate, regular rhythm and intact distal pulses.  Exam reveals no gallop and no friction rub.   No murmur heard. Pulses:      Radial pulses are 2+ on the right side, and 2+ on the left side.       Posterior tibial pulses are 1+ on the right side, and 1+ on the left side.  Respiratory: Breath sounds normal. No respiratory distress. He has no wheezes. He has no rales.  Musculoskeletal: He exhibits no edema.  Neurological: He is alert and oriented  to person, place, and time.  Skin: Skin is warm. He is diaphoretic.     Assessment/Plan Principal Problem:   STEMI (ST elevation myocardial infarction) Active Problems:   DIABETES MELLITUS, TYPE II   DYSLIPIDEMIA  Plan: STEMI. Take urgently to the cath lab for PCI.    Allayne Butcher, PA-C 11/29/2012, 12:57 PM

## 2012-11-29 NOTE — ED Notes (Signed)
Pt placed on venturi mask

## 2012-11-29 NOTE — Progress Notes (Signed)
Chaplain Note:  Chaplain visited with pt and pt's wife during pt's cath procedure.  Pt was in cath lab being treated by cath lab team.  Pt's wife was in cath lab waiting area.  Chaplain provided spiritual comfort and support for pt's wife and cath lab team. Chaplain worked with cath lab team in communicating with pt's wife and in escorting pt's physician to meet with her.  Following the pt's cath procedure, chaplain escorted pt's wife to CICU waiting area and informed CICU secretary of her location.  Pt's wife and cath lab staff expressed appreciation for chaplain support.  Chaplain will follow up as needed.  11/29/12 1400  Clinical Encounter Type  Visited With Patient;Family;Health care provider  Visit Type Spiritual support  Referral From Physician  Spiritual Encounters  Spiritual Needs Emotional  Stress Factors  Patient Stress Factors Health changes;Major life changes  Family Stress Factors Major life changes  Verdie Shire, [place] [phone]

## 2012-11-30 ENCOUNTER — Encounter (HOSPITAL_COMMUNITY): Payer: Self-pay | Admitting: Cardiology

## 2012-11-30 DIAGNOSIS — I255 Ischemic cardiomyopathy: Secondary | ICD-10-CM | POA: Diagnosis not present

## 2012-11-30 DIAGNOSIS — Z9861 Coronary angioplasty status: Secondary | ICD-10-CM

## 2012-11-30 DIAGNOSIS — I2589 Other forms of chronic ischemic heart disease: Secondary | ICD-10-CM

## 2012-11-30 LAB — CBC
Hemoglobin: 15 g/dL (ref 13.0–17.0)
MCHC: 33.3 g/dL (ref 30.0–36.0)
Platelets: 262 10*3/uL (ref 150–400)
RDW: 13.6 % (ref 11.5–15.5)

## 2012-11-30 LAB — TROPONIN I
Troponin I: 5.14 ng/mL (ref <0.30)
Troponin I: 11.06 ng/mL (ref <0.30)
Troponin I: >20 ng/mL (ref <0.30)
Troponin I: >20 ng/mL (ref <0.30)

## 2012-11-30 LAB — BASIC METABOLIC PANEL
GFR calc Af Amer: >90 mL/min (ref >90)
GFR calc non Af Amer: >90 mL/min (ref >90)
Potassium: 4.7 mEq/L (ref 3.5–5.1)
Sodium: 135 mEq/L (ref 135–145)

## 2012-11-30 LAB — GLUCOSE, CAPILLARY
Glucose-Capillary: 115 mg/dL — ABNORMAL HIGH (ref 70–99)
Glucose-Capillary: 137 mg/dL — ABNORMAL HIGH (ref 70–99)

## 2012-11-30 MED ORDER — MAGNESIUM SULFATE 40 MG/ML IJ SOLN
2.0000 g | Freq: Once | INTRAMUSCULAR | Status: AC
  Administered 2012-11-30: 2 g via INTRAVENOUS
  Filled 2012-11-30: qty 50

## 2012-11-30 MED ORDER — ISOSORBIDE MONONITRATE ER 30 MG PO TB24
30.0000 mg | ORAL_TABLET | Freq: Every day | ORAL | Status: DC
  Administered 2012-11-30: 30 mg via ORAL
  Filled 2012-11-30 (×2): qty 1

## 2012-11-30 NOTE — Progress Notes (Signed)
CARDIAC REHAB PHASE I   PRE:  Rate/Rhythm: 98SR  BP:  Supine:   Sitting: 152/84  Standing:    SaO2: 95%RA  MODE:  Ambulation: 390 ft   POST:  Rate/Rhythm: 98SR  BP:  Supine: 142/83  Sitting:   Standing:    SaO2:  0935-1035 Pt walked 390 ft with steady gait. Denied CP. C/o feeling sleepy and wanting to go to bed. Assisted to bed after walk. Education was completed before walk with pt and wife. Understanding voiced. Discussed CRP 2 and permission given to refer to [facility] Phase 2. Gave brilinta packet.    Luetta Nutting, RN BSN  11/30/2012 10:29 AM

## 2012-11-30 NOTE — Progress Notes (Addendum)
]    Subjective:  Feels better today, just a bit nervous Had brief run of NSVT o/n - IV Lopressor given & BB dose PO increased.  Objective:  Vital Signs in the last 24 hours: Temp:  [97.2 F (36.2 C)-99 F (37.2 C)] 99 F (37.2 C) (07/05 0800) Pulse Rate:  [78-101] 82 (07/05 0600) Resp:  [9-30] 17 (07/05 0600) BP: (102-155)/(61-113) 145/78 mmHg (07/05 0600) SpO2:  [93 %-100 %] 97 % (07/05 0600) Arterial Line BP: (131-159)/(68-87) 138/77 mmHg (07/04 2035) FiO2 (%):  [4 %] 4 % (07/04 2100) Weight:  [177 lb 11.1 oz (80.6 kg)-177 lb 11.1 oz (80.601 kg)] 177 lb 11.1 oz (80.601 kg) (07/04 1500)  Intake/Output from previous day: 07/04 0701 - 07/05 0700 In: 1231 [P.O.:690; I.V.:491; IV Piggyback:50] Out: 2100 [Urine:2100] Intake/Output from this shift: Total I/O In: 26 [I.V.:26] Out: 500 [Urine:500]  Physical Exam: General appearance: alert, cooperative, appears stated age, no distress and mildly obese Neck: no carotid bruit and no JVD Lungs: clear to auscultation bilaterally, normal percussion bilaterally and non-labored Heart: regular rate and rhythm, S1, S2 normal, no murmur, click, rub or gallop Abdomen: soft, non-tender; bowel sounds normal; no masses,  no organomegaly Extremities: extremities normal, atraumatic, no cyanosis or edema Pulses: 2+ and symmetric  Lab Results:  Recent Labs  11/29/12 1310 11/30/12 0500  WBC 11.4* 14.6*  HGB 13.2 15.0  PLT 250 262    Recent Labs  11/29/12 1306 11/30/12 0500  NA 142 135  K 3.6 4.7  CL 107 104  CO2  --  23  GLUCOSE 187* 144*  BUN 14 11  CREATININE 1.00 0.73    Recent Labs  11/29/12 2314 11/30/12 0500  TROPONINI 5.14* 11.06*   Imaging: Imaging results have been reviewed  Cardiac Studies: Cath - early Mid LAD Thrombotic lesion - s/p PCI 3.0 x 18 Xience eXpedition DES (post-dilated to 3.4 mm)  Assessment/Plan:  Principal Problem:   STEMI (ST elevation myocardial infarction) Active Problems:   CAD (coronary  artery disease), native coronary artery - LAD 99% Thrombosis - PCI with Xience eXpedition 3.0 mm x `18 mm (3.4 mm post)   Cardiomyopathy, ischemic - EF ~40-45% with distal Anterior - Antero& InferoApical hypokinesis.   DIABETES MELLITUS, TYPE II   DYSLIPIDEMIA  Doing well post PCI -- BP stable; have increased BB dose for today.  On ACE-I as well (DM with CAD & Ischemic CM) -- wean off NTG gtt. Add low dose IMDUR.  On DAPT  On statin  Will probably simply return to home DM regimen for d/c (Metformin on hold until tomorrow); SSI for coverage until able to get him back on full home regimen.  No sign of CHF - autodiuresing   [facility] Phase 1 & 2 consult  CM consult for Brilinta. Transfer to TCU today & likely Tele tomorrow.  Anticipate fast-track d/c by Monday.    LOS: 1 day    Nancey Kreitz W 11/30/2012, 11:01 AM

## 2012-12-01 DIAGNOSIS — E785 Hyperlipidemia, unspecified: Secondary | ICD-10-CM

## 2012-12-01 LAB — GLUCOSE, CAPILLARY
Glucose-Capillary: 168 mg/dL — ABNORMAL HIGH (ref 70–99)
Glucose-Capillary: 63 mg/dL — ABNORMAL LOW (ref 70–99)
Glucose-Capillary: 64 mg/dL — ABNORMAL LOW (ref 70–99)
Glucose-Capillary: 84 mg/dL (ref 70–99)
Glucose-Capillary: 166 mg/dL — ABNORMAL HIGH (ref 70–99)

## 2012-12-01 LAB — BASIC METABOLIC PANEL
BUN: 14 mg/dL (ref 6–23)
Chloride: 102 mEq/L (ref 96–112)
GFR calc Af Amer: >90 mL/min (ref >90)
Potassium: 4.1 mEq/L (ref 3.5–5.1)

## 2012-12-01 LAB — TROPONIN I: Troponin I: >20 ng/mL (ref <0.30)

## 2012-12-01 LAB — CBC
HCT: 42.2 % (ref 39.0–52.0)
Platelets: 254 10*3/uL (ref 150–400)
RDW: 13.6 % (ref 11.5–15.5)
WBC: 11 10*3/uL — ABNORMAL HIGH (ref 4.0–10.5)

## 2012-12-01 MED ORDER — ISOSORBIDE MONONITRATE ER 30 MG PO TB24
30.0000 mg | ORAL_TABLET | Freq: Every day | ORAL | Status: DC
  Administered 2012-12-01 – 2012-12-03 (×3): 30 mg via ORAL
  Filled 2012-12-01 (×3): qty 1

## 2012-12-01 MED ORDER — METFORMIN HCL 500 MG PO TABS
1000.0000 mg | ORAL_TABLET | Freq: Two times a day (BID) | ORAL | Status: DC
  Administered 2012-12-02 – 2012-12-03 (×3): 1000 mg via ORAL
  Filled 2012-12-01 (×6): qty 2

## 2012-12-01 NOTE — Progress Notes (Addendum)
   Subjective:  Feels better today, just a bit nervous Had brief run of NSVT o/n - IV Lopressor given & BB dose PO increased.  Objective:  Vital Signs in the last 24 hours: Temp:  [96.7 F (35.9 C)-98.5 F (36.9 C)] 97.8 F (36.6 C) (07/06 0818) Pulse Rate:  [77-82] 77 (07/05 1400) Resp:  [15-18] 16 (07/06 0329) BP: (110-137)/(47-77) 110/65 mmHg (07/06 0818) SpO2:  [95 %-97 %] 95 % (07/06 0818) Some overnight reading s of SBP in 80s while sleeping.  Will ensure ACE-I dosed in AM. IMDUR in PM.  Intake/Output from previous day: 07/05 0701 - 07/06 0700 In: 875 [P.O.:800; I.V.:75] Out: 1300 [Urine:1300] Intake/Output from this shift:    Physical Exam: General appearance: alert, cooperative, appears stated age, no distress and mildly obese Neck: no carotid bruit and no JVD Lungs: clear to auscultation bilaterally, normal percussion bilaterally and non-labored Heart: regular rate and rhythm, S1, S2 normal, no murmur, click, rub or gallop Abdomen: soft, non-tender; bowel sounds normal; no masses,  no organomegaly Extremities: extremities normal, atraumatic, no cyanosis or edema Pulses: 2+ and symmetric  Lab Results:  Recent Labs  11/30/12 0500 12/01/12 0602  WBC 14.6* 11.0*  HGB 15.0 14.8  PLT 262 254    Recent Labs  11/30/12 0500 12/01/12 0602  NA 135 136  K 4.7 4.1  CL 104 102  CO2 23 25  GLUCOSE 144* 132*  BUN 11 14  CREATININE 0.73 0.88    Recent Labs  11/30/12 1616 11/30/12 2330  TROPONINI >20.00* >20.00*   Imaging: Imaging results have been reviewed  Cardiac Studies: Cath/PCI 7/4 - early Mid LAD Thrombotic lesion - s/p PCI 3.0 x 18 Xience eXpedition DES (post-dilated to 3.4 mm)  Assessment/Plan:  Principal Problem:   STEMI (ST elevation myocardial infarction) Active Problems:   CAD (coronary artery disease), native coronary artery - (STEMI) LAD 99% Thrombosis - PCI with Xience eXpedition 3.0 mm x `18 mm (3.4 mm post)   Cardiomyopathy, ischemic -  EF ~40-45% with distal Anterior - Antero& InferoApical hypokinesis.   DIABETES MELLITUS, TYPE II   DYSLIPIDEMIA  Doing well post PCI -- BP stable; have increased BB dose for today.  On ACE-I as well (DM with CAD & Ischemic CM) -- wean off NTG gtt. Add low dose IMDUR.  On DAPT  On statin  Will probably simply return to home DM regimen for d/c (Metformin restart tonite); SSI for coverage until able to get him back on full home regimen.  No sign of CHF - autodiuresing  [facility] Phase 1 & 2 consult  CM consult for Brilinta. Transfer to Tele t.  Anticipate d/c by Monday.    LOS: 2 days    Matthew Prince,Matthew Prince 12/01/2012, 8:32 AM

## 2012-12-01 NOTE — Progress Notes (Signed)
CBG 64 PAGED Matthew Prince THE PA SHE CHANGED NO ORDERS RECHECKED CBG IT WAS 109

## 2012-12-02 DIAGNOSIS — I219 Acute myocardial infarction, unspecified: Secondary | ICD-10-CM

## 2012-12-02 LAB — BASIC METABOLIC PANEL
CO2: 27 mEq/L (ref 19–32)
Calcium: 9.4 mg/dL (ref 8.4–10.5)
Chloride: 100 mEq/L (ref 96–112)
Sodium: 136 mEq/L (ref 135–145)

## 2012-12-02 LAB — POCT ACTIVATED CLOTTING TIME: Activated Clotting Time: 539 seconds

## 2012-12-02 LAB — CBC
Platelets: 264 10*3/uL (ref 150–400)
RBC: 4.88 MIL/uL (ref 4.22–5.81)
WBC: 8.5 10*3/uL (ref 4.0–10.5)

## 2012-12-02 LAB — TROPONIN I: Troponin I: 8.77 ng/mL (ref <0.30)

## 2012-12-02 LAB — GLUCOSE, CAPILLARY: Glucose-Capillary: 109 mg/dL — ABNORMAL HIGH (ref 70–99)

## 2012-12-02 MED ORDER — TICAGRELOR 90 MG PO TABS
90.0000 mg | ORAL_TABLET | Freq: Two times a day (BID) | ORAL | Status: DC
  Administered 2012-12-02 – 2012-12-03 (×2): 90 mg via ORAL
  Filled 2012-12-02 (×3): qty 1

## 2012-12-02 MED ORDER — TICAGRELOR 90 MG PO TABS: 90.0000 mg | ORAL_TABLET | Freq: Two times a day (BID) | ORAL | Status: DC

## 2012-12-02 MED ORDER — TICAGRELOR 90 MG PO TABS
180.0000 mg | ORAL_TABLET | Freq: Once | ORAL | Status: AC
  Administered 2012-12-02: 180 mg via ORAL
  Filled 2012-12-02: qty 2

## 2012-12-02 MED ORDER — ASPIRIN 81 MG PO CHEW
81.0000 mg | CHEWABLE_TABLET | Freq: Every day | ORAL | Status: DC
  Administered 2012-12-02 – 2012-12-03 (×2): 81 mg via ORAL
  Filled 2012-12-02 (×2): qty 1

## 2012-12-02 MED FILL — Sodium Chloride IV Soln 0.9%: INTRAVENOUS | Qty: 50 | Status: AC

## 2012-12-02 NOTE — Progress Notes (Signed)
Noted today, not on ASA or Brilinta.  Discussed with Dr. Rennis Golden, will reload now, begin ASA and check EKG.  Will also keep one more day.  Will go ahead and check lipids in AM and echo today.

## 2012-12-02 NOTE — Progress Notes (Signed)
Subjective: No complaints, no chest pain, no SOB  Objective: Vital signs in last 24 hours: Temp:  [97.5 F (36.4 C)-98.3 F (36.8 C)] 98.3 F (36.8 C) (07/07 0459) Pulse Rate:  [79-91] 79 (07/07 0459) Resp:  [16-18] 16 (07/07 0459) BP: (98-141)/(52-87) 98/52 mmHg (07/07 0459) SpO2:  [95 %-99 %] 99 % (07/07 0459) Weight change:  Last BM Date: 12/01/12 Intake/Output from previous day: +720   07/06 0701 - 07/07 0700 In: 720 [P.O.:720] Out: -  Intake/Output this shift:    ZO:XWRUEAV:WUJWJXBJ affect, NAD Skin:Warm and dry, brisk capillary refill HEENT:normocephalic, sclera clear, mucus membranes moist Neck:supple, no JVD, no bruits  Heart:S1S2 RRR without murmur, gallup, rub or click Lungs:clear without rales, rhonchi, or wheezes YNW:GNFA, non tender, + BS, do not palpate liver spleen or masses Ext:no lower ext edema, 2+ pedal pulses, 2+ radial pulses Neuro:alert and oriented, MAE, follows commands, + facial symmetry    Lab Results:  Recent Labs  11/30/12 0500 12/01/12 0602  WBC 14.6* 11.0*  HGB 15.0 14.8  HCT 45.1 42.2  PLT 262 254   BMET  Recent Labs  11/30/12 0500 12/01/12 0602  NA 135 136  K 4.7 4.1  CL 104 102  CO2 23 25  GLUCOSE 144* 132*  BUN 11 14  CREATININE 0.73 0.88  CALCIUM 9.4 8.9    Recent Labs  11/30/12 2330 12/01/12 0850  TROPONINI >20.00* 8.00*    Lab Results  Component Value Date   CHOL 129 06/02/2011   HDL 39.00* 11/23/2010   LDLCALC 118* 07/16/2008   LDLDIRECT 182.0 11/23/2010   TRIG 57.0 11/23/2010   CHOLHDL 7 11/23/2010   Lab Results  Component Value Date   HGBA1C 7.3* 11/29/2012     Lab Results  Component Value Date   TSH 1.33 11/23/2010    Studies/Results: No results found.  Medications: I have reviewed the patient's current medications. Scheduled Meds: . atorvastatin  40 mg Oral Daily  . carvedilol  6.25 mg Oral BID WC  . glimepiride  4 mg Oral QAC breakfast  . insulin aspart  0-9 Units Subcutaneous TID WC  .  isosorbide mononitrate  30 mg Oral Daily  . linagliptin  5 mg Oral Daily  . lisinopril  2.5 mg Oral Daily  . metFORMIN  1,000 mg Oral BID WC  . sodium chloride  3 mL Intravenous Q12H   Continuous Infusions:  PRN Meds:.sodium chloride, acetaminophen, morphine injection, ondansetron (ZOFRAN) IV, sodium chloride  Assessment/Plan: Principal Problem:   STEMI (ST elevation myocardial infarction) Active Problems:   DIABETES MELLITUS, TYPE II   DYSLIPIDEMIA   CAD (coronary artery disease), native coronary artery - (STEMI) LAD 99% Thrombosis - PCI with Xience eXpedition 3.0 mm x `18 mm (3.4 mm post)   Cardiomyopathy, ischemic - EF ~40-45% with distal Anterior - Antero& InferoApical hypokinesis.  Plan: Pt's LDL not at goal and on same outpatient dose of Lipitor.  ? Increase to 80 vs. Adding Zetia Glucose stable to low. BP borderline.  Brilinta packet given to pt. Will need hepatic panel   Outpatient echo- LV dysfunction with cath  Post stent NSVT Possible discharge today.  No driving for 2 weeks, follow up.  ST to 108 at times.   LOS: 3 days   Time spent with pt. :15 minutes. [facility] R  Nurse Practitioner Certified Pager [phone] 12/02/2012, 7:51 AM   Agree with note written by Nada Boozer RNP  POD #3 LAD PCI/Stent with DES in setting of Ant STEMI.  No recurrent CP. Groin OK. Exam benign. OK for DC home on DAPT. [facility]. Follow FL[facility]. No work until he sees [place] back in office. MLP 1-2 weeks then [facility] after that.  Runell Gess 12/02/2012 8:49 AM

## 2012-12-02 NOTE — Progress Notes (Signed)
Inpatient Diabetes Program Recommendations  AACE/ADA: New Consensus Statement on Inpatient Glycemic Control (2013)  Target Ranges:  Prepandial:   less than 140 mg/dL      Peak postprandial:   less than 180 mg/dL (1-2 hours)      Critically ill patients:  140 - 180 mg/dL   Reason for Visit: Results for Matthew Prince, Matthew Prince (MRN [id number]) as of 12/02/2012 11:28  Ref. Range 12/01/2012 11:50 12/01/2012 16:28 12/01/2012 17:00 12/01/2012 20:59 12/02/2012 06:01  Glucose-Capillary Latest Range: 70-99 mg/dL 409 (H) 64 (L) 84 811 (H) 159 (H)    Note CBG's low on 12/02/12.  Please hold Amaryl while in the hospital.

## 2012-12-02 NOTE — Progress Notes (Signed)
CARDIAC REHAB PHASE I   PRE:  Rate/Rhythm: 101 ST  BP:  Supine:   Sitting:   Standing: 106/70   SaO2:   MODE:  Ambulation: 740 ft   POST:  Rate/Rhythm: 102ST  BP:  Supine:   Sitting: 130/70  Standing:    SaO2:  1610-9604 Pt walked 740 ft with steady gait. Tolerated well. Denied CP. No questions on education done on Saturday. Stated ready to go home.   Luetta Nutting, RN BSN  12/02/2012 8:38 AM

## 2012-12-02 NOTE — Care Management Note (Unsigned)
    Page 1 of 1   12/02/2012     11:55:38 AM   CARE MANAGEMENT NOTE 12/02/2012  Patient:  Matthew Prince, Matthew Prince   Account Number:  [id number]  Date Initiated:  12/02/2012  Documentation initiated by:  Wyona Neils  Subjective/Objective Assessment:   PT ADM ON 11/29/12 WITH STEMI.  PTA, PT INDEPENDENT, LIVES WITH SPOUSE.     Action/Plan:   CM REFERRAL FOR BRILINTA ASSISTANCE.  WILL CHECK COVERAGE AND COPAY AMT, AND FOLLOW UP WITH PT.   Anticipated DC Date:  12/03/2012   Anticipated DC Plan:  HOME/SELF CARE      DC Planning Services  CM consult      Choice offered to / List presented to:             Status of service:  In process, will continue to follow Medicare Important Message given?   (If response is "NO", the following Medicare IM given date fields will be blank) Date Medicare IM given:   Date Additional Medicare IM given:    Discharge Disposition:    Per UR Regulation:  Reviewed for med. necessity/level of care/duration of stay  If discussed at Long Length of Stay Meetings, dates discussed:    Comments:

## 2012-12-03 LAB — COMPREHENSIVE METABOLIC PANEL
Alkaline Phosphatase: 52 U/L (ref 39–117)
BUN: 14 mg/dL (ref 6–23)
Chloride: 101 mEq/L (ref 96–112)
Creatinine, Ser: 0.95 mg/dL (ref 0.50–1.35)
GFR calc Af Amer: >90 mL/min (ref >90)
GFR calc non Af Amer: >90 mL/min (ref >90)
Glucose, Bld: 130 mg/dL — ABNORMAL HIGH (ref 70–99)
Potassium: 4.1 mEq/L (ref 3.5–5.1)
Total Bilirubin: 0.5 mg/dL (ref 0.3–1.2)

## 2012-12-03 LAB — CBC
HCT: 43.1 % (ref 39.0–52.0)
MCH: 29.7 pg (ref 26.0–34.0)
MCV: 87.6 fL (ref 78.0–100.0)
RBC: 4.92 MIL/uL (ref 4.22–5.81)
RDW: 13.5 % (ref 11.5–15.5)
WBC: 6.9 10*3/uL (ref 4.0–10.5)

## 2012-12-03 LAB — GLUCOSE, CAPILLARY: Glucose-Capillary: 127 mg/dL — ABNORMAL HIGH (ref 70–99)

## 2012-12-03 MED ORDER — TICAGRELOR 90 MG PO TABS: 90.0000 mg | ORAL_TABLET | Freq: Two times a day (BID) | ORAL | Status: DC

## 2012-12-03 MED ORDER — CARVEDILOL 6.25 MG PO TABS: 6.2500 mg | ORAL_TABLET | Freq: Two times a day (BID) | ORAL | Status: DC

## 2012-12-03 MED ORDER — ASPIRIN 81 MG PO CHEW: 81.0000 mg | CHEWABLE_TABLET | Freq: Every day | ORAL | Status: DC

## 2012-12-03 MED ORDER — NITROGLYCERIN 0.4 MG SL SUBL: 0.4000 mg | SUBLINGUAL_TABLET | SUBLINGUAL | Status: DC | PRN

## 2012-12-03 MED ORDER — ISOSORBIDE MONONITRATE ER 30 MG PO TB24: 30.0000 mg | ORAL_TABLET | Freq: Every day | ORAL | Status: DC

## 2012-12-03 MED ORDER — LISINOPRIL 2.5 MG PO TABS: 2.5000 mg | ORAL_TABLET | Freq: Every day | ORAL | Status: DC

## 2012-12-03 NOTE — Progress Notes (Signed)
[facility]  Subjective: No further chest pain. Ready for discharge.  Objective: Vital signs in last 24 hours: Temp:  [98.5 F (36.9 C)-98.7 F (37.1 C)] 98.7 F (37.1 C) (07/08 0508) Pulse Rate:  [79-92] 79 (07/08 0508) Resp:  [18] 18 (07/08 0508) BP: (97-125)/(51-75) 125/75 mmHg (07/08 0508) SpO2:  [95 %-99 %] 99 % (07/08 0508) Last BM Date: 12/01/12  Intake/Output from previous day: 07/07 0701 - 07/08 0700 In: 480 [P.O.:480] Out: -  Intake/Output this shift:    Medications Current Facility-Administered Medications  Medication Dose Route Frequency Provider Last Rate Last Dose  . 0.9 %  sodium chloride infusion  250 mL Intravenous PRN Marykay Lex, MD 10 mL/hr at 11/30/12 0800 250 mL at 11/30/12 0800  . acetaminophen (TYLENOL) tablet 650 mg  650 mg Oral Q4H PRN Marykay Lex, MD      . aspirin chewable tablet 81 mg  81 mg Oral Daily Nada Boozer, NP   81 mg at 12/02/12 1200  . atorvastatin (LIPITOR) tablet 40 mg  40 mg Oral Daily Marykay Lex, MD   40 mg at 12/02/12 0933  . carvedilol (COREG) tablet 6.25 mg  6.25 mg Oral BID WC Marykay Lex, MD   6.25 mg at 12/03/12 0802  . insulin aspart (novoLOG) injection 0-9 Units  0-9 Units Subcutaneous TID WC Marykay Lex, MD   1 Units at 12/03/12 [phone]  . isosorbide mononitrate (IMDUR) 24 hr tablet 30 mg  30 mg Oral Daily Marykay Lex, MD   30 mg at 12/02/12 0933  . linagliptin (TRADJENTA) tablet 5 mg  5 mg Oral Daily Marykay Lex, MD   5 mg at 12/02/12 0933  . lisinopril (PRINIVIL,ZESTRIL) tablet 2.5 mg  2.5 mg Oral Daily Marykay Lex, MD   2.5 mg at 12/02/12 0933  . metFORMIN (GLUCOPHAGE) tablet 1,000 mg  1,000 mg Oral BID WC Marykay Lex, MD   1,000 mg at 12/03/12 0802  . morphine 2 MG/ML injection 2 mg  2 mg Intravenous Q1H PRN Marykay Lex, MD      . ondansetron [facility]) injection 4 mg  4 mg Intravenous Q6H PRN Marykay Lex, MD      . sodium chloride 0.9 % injection 3  mL  3 mL Intravenous Q12H Marykay Lex, MD   3 mL at 12/02/12 2156  . sodium chloride 0.9 % injection 3 mL  3 mL Intravenous PRN Marykay Lex, MD      . Ticagrelor [facility]) tablet 90 mg  90 mg Oral BID Nada Boozer, NP   90 mg at 12/02/12 2153   PE: General appearance: alert, cooperative and no distress Lungs: clear to auscultation bilaterally Heart: regular rate and rhythm Extremities: no LEE Pulses: 2+ and symmetric Skin: warm and dry Neurologic: Grossly normal  Lab Results:   Recent Labs  12/01/12 0602 12/02/12 0738 12/03/12 0515  WBC 11.0* 8.5 6.9  HGB 14.8 14.0 14.6  HCT 42.2 43.1 43.1  PLT 254 264 256   BMET  Recent Labs  12/01/12 0602 12/02/12 0738 12/03/12 0515  NA 136 136 138  K 4.1 4.1 4.1  CL 102 100 101  CO2 25 27 25   GLUCOSE 132* 168* 130*  BUN 14 15 14   CREATININE 0.88 0.94 0.95  CALCIUM 8.9 9.4 10.0   Cholesterol  Recent Labs  12/03/12 0515  CHOL 117   Lipid Panel     Component Value  Date/Time   CHOL 117 12/03/2012 0515   TRIG 84 12/03/2012 0515   HDL 32* 12/03/2012 0515   CHOLHDL 3.7 12/03/2012 0515   VLDL 17 12/03/2012 0515   LDLCALC 68 12/03/2012 0515   Assessment/Plan  Principal Problem:   STEMI (ST elevation myocardial infarction), ant wall Active Problems:   DIABETES MELLITUS, TYPE II   DYSLIPIDEMIA   CAD (coronary artery disease), native coronary artery - (STEMI) LAD 99% Thrombosis - PCI with Xience eXpedition 3.0 mm x `18 mm (3.4 mm post)   Cardiomyopathy, ischemic - EF ~40-45% with distal Anterior - Antero& InferoApical hypokinesis.  Plan: Pt received Brilinta load yesterday. Now on 90 mg BID and 81mg  of ASA daily. No further chest pain.  Have left message for case manager to arrange 30 day free supply of Brilinta. Once arranged, will discharge home today. Will arrange f/u at [facility]. Will need outpatient echo in ~3 months. Will order for patient to take both his Imdur and Lisinopril at night to avoid hypotension. Also on a BB.      LOS: 4 days    Brittainy M. Sharol Harness, PA-C 12/03/2012 8:30 AM  I have seen and evaluated the patient this AM along with Wilburt Finlay, PA. I agree with her findings, examination as well as impression recommendations.  Doing very well this Am.  No CP or HF Sx.  Labs stable.  BP & HR stable.   RE-loaded with Ticagrelor -- CM consulted to ensure med assistance provided.  I do not think that he needs an Echo during this index hospitalization b/c he did have an acceptable LV Gram with Antero-apical, inferoapical/apical HK c/w LAD infarct. EF ~40-45%.  He will need a f/u echo in ~2-3 months to see how much recovery has occurred.    He is on ACE-I & BB for Ischemic CM  On DAPT for DES -- CM consulted to help.  Back on home DM regimen for d/c. On statin.  MD Time with pt: 15 min  Mert Dietrick W, M.D., M.S. [facility] [address] [place], [place]  [address]  [phone] Pager # [phone] 12/03/2012 8:58 AM

## 2012-12-03 NOTE — Discharge Summary (Signed)
Physician Discharge Summary  Patient ID: Matthew Prince MRN: [id number] DOB/AGE: 07/19/1955 57 y.o.  Admit date: 11/29/2012 Discharge date: 12/03/2012  Admission Diagnoses: STEMI   Discharge Diagnoses:  Principal Problem:   STEMI (ST elevation myocardial infarction), ant wall Active Problems:   DIABETES MELLITUS, TYPE II   DYSLIPIDEMIA   CAD (coronary artery disease), native coronary artery - (STEMI) LAD 99% Thrombosis - PCI with Xience eXpedition 3.0 mm x `18 mm (3.4 mm post)   Cardiomyopathy, ischemic - EF ~40-45% with distal Anterior - Antero& InferoApical hypokinesis.   Discharged Condition: stable  Hospital Course: The patient is a 57 y/o male with a PMH of non insulin dependent DM and HLD, who presented to [facility] ER, urgently by EMS, on 11/29/12 with a STEMI. The patient had severe left sided chest pain with associated SOB, diaphoresis, nausea, profuse vomiting and a sense of impending doom. His EKG demonstrated ~ 2mm anterior and inferior ST elevations in V3-V5 and 1.5 mm ST elevations in II, III and aVF. He was taken urgently to the cath lab for PCI. The procedure was performed by Dr. Herbie Baltimore, via the right femoral artery. He was found to have severe early mid LAD thrombotic subtotal stenosis. This was successfully treated with PTCA-PCI of the LAD lesion with a Xience eXpedition DES. The remainder of his coronaries were normal. He had moderately reduced LV function with expected anterior, apical and inferoapical hypokinesis. His EF was 40-45%. Immediately after PCI, he had resolution of his chest pain. He left the cath lab in stable condition and was transferred to the CCU for observation. He was placed on DAPT with ASA and Brilinta. He was placed on a BB, an ACE-I and a statin. He had no complications and no further chest pain. The right femoral access site remained stable. He was assessed by cardiac rehab and underwent phase I rehab with little difficulty. He eventually was transition down to  telemetry. It was at this time, that it was discovered that he had missed a day of antiplatelet therapy. The decision as made to load with Brilinta and to keep an additional day for observation. After loading dose, he was restarted on daily maintainence dose of 90 mg of Brilinta BID and 81 mg of ASA daily. On hospital day 4, he was seen and examined by Dr. Herbie Baltimore, who determined that he was stable for discharge home. He will follow up with Dr. Herbie Baltimore at [facility] on 12/16/12.   Consults: None  Significant Diagnostic Studies:  Emergent LHC with PCI 11/29/12 Hemodynamics:  Central Aortic / Mean Pressures: 122/71 mmHg; 94 mmHg  Left Ventricular Pressures / EDP: 120/12 mmHg; 28 mmHg Left Ventriculography:  EF: 40-45 %  Wall Motion: mid to apical anterior, anteroapical & inferoapical hypokinesis Coronary Anatomy:  Left Main: Large caliber vessel, bifurcates into LAD & Circumflex. Angiographically normal. LAD: Large caliber vessel with a very proximal small Diag 1 (D1) and septal perforator followed by a 99% thrombotic lesion that spanned the distance to a second small Diag (D2). There was TIMI 1 flow to the mid LAD, but TIMI 0 flow distally.  D1: Small (~1.5 mm) caliber vessel that is the starting point of the LAD lesion, minimal plaque noted in the ostium. Minimal luminal irregularities.  D2: Small ~1.5-15mm) bifurcating vessel with minimal luminal irregularities.  POST PCI: the LAD wraps around the apex with a apical diagonal branch. Left Circumflex: Large caliber, non-dominant vessel that courses as a large, branching Lateral OM after giving off a  small AV-groove branch. The vessel reaches the inferolateral apex, bifurcating distally.  RCA: Large caliber, dominant vessel with several Atrial & RV marginal branches before bifurcating distally into the Right Posterior Descending Artery (rPDA) and the Right Posterior AV Groove Branch (RPAV). Minimal luminal irregularities.  RPDA: Moderate to large caliber  vessel with multiple septal perforators, that reaches ~2/3 of there way to the apex. Minimal luminal irregularities.  RPL Sysytem:The RPAV begins as a moderate caliber vessel that gives off a bifurcating Right Posterolateral (RPL) 1 before terminating in a small to moderate caliber bifurcating RPL2. Minimal luminal irregularities. With the guide catheter in place, and the clear culprit lesion being the early mid LAD lesion. The decision was made to proceed with Urgent PCI.  Percutaneous Coronary Intervention: Angiomax bolus administered & ACT checked > 200 sec; Brilinta given.  Guide: 6 Fr XB 3.5 Guidewire: BMW  Predilation Balloon: Enmerge MR 2.5 mm x 12 mm; (1337 hrs); DTB 48 min.  10 Atm x 15 Sec, 10Atm x 30 Se Stent: Xience eXpedition DES 3.0 mm x 18 mm;  Deployed at 16 Atm x 45 Sec, diameter ~3.2 mm Post-dilation Balloon: [place] Sprinter 3.25 mm x 15 mm;  18 Atm x 45 Sec,  Final Diameter: 3.4 mm Post deployment angiography in multiple views, with and without guidewire in place revealed excellent stent deployment and lesion coverage. There was no evidence of dissection or perforation. TIMI 3 flow was restored after stent placement.   POST-OPERATIVE DIAGNOSIS:  Severe early mid LAD thrombotic subtotal stenosis with otherwise minimal CAD.  Successful PTCA-PCI of the LAD lesion with a Xience eXpedition DES 3.0 mm x 18 mm (postdilated to ~3.4 mm)  Moderately reduced LV function with expected Anterior, Apical & Inferoapical hypokinesis.  Mild LVEDP elevation.   Treatments: See Hospital Course  Discharge Exam: Blood pressure 125/75, pulse 79, temperature 98.7 F (37.1 C), temperature source Oral, resp. rate 18, height 5\' 8"  (1.727 m), weight 177 lb 11.1 oz (80.601 kg), SpO2 99.00%.   Disposition:   Discharge Orders   Future Appointments Provider Department Dept Phone   12/06/2012 3:45 PM Gordy Savers, MD [facility] [phone]   12/16/2012 11:45 AM Marykay Lex, MD [facility] [place] [phone]   Future Orders Complete By Expires     Amb Referral to Cardiac Rehabilitation  As directed         Medication List    ASK your doctor about these medications       atorvastatin 40 MG tablet  Commonly known as:  LIPITOR  Take 1 tablet (40 mg total) by mouth daily.     glimepiride 2 MG tablet  Commonly known as:  AMARYL  Take 2 tablets (4 mg total) by mouth daily before breakfast.     glucose blood test strip  - Check glucose level before each meal  -   - ONE TOUCH VERIO TEST STRIP     metFORMIN 1000 MG tablet  Commonly known as:  GLUCOPHAGE  Take 1 tablet (1,000 mg total) by mouth 2 (two) times daily with a meal.     saxagliptin HCl 5 MG Tabs tablet  Commonly known as:  ONGLYZA  Take 1 tablet (5 mg total) by mouth daily.           Follow-up Information   Follow up with Marykay Lex, MD On 12/16/2012. (11:45 am )    Contact information:   [address] [place] [address]  [phone]      TIME SPENT ON DISCHARGE, INCLUDING PHYSICIAN TIME: > 30 MINUTES  Signed: Allayne Butcher, PA-C 12/03/2012, 9:22 AM

## 2012-12-05 ENCOUNTER — Ambulatory Visit (INDEPENDENT_AMBULATORY_CARE_PROVIDER_SITE_OTHER): Payer: Managed Care, Other (non HMO) | Admitting: Internal Medicine

## 2012-12-05 ENCOUNTER — Encounter: Payer: Self-pay | Admitting: Internal Medicine

## 2012-12-05 VITALS — BP 120/70 | HR 91 | Temp 98.2°F | Resp 18 | Wt 181.0 lb

## 2012-12-05 DIAGNOSIS — I255 Ischemic cardiomyopathy: Secondary | ICD-10-CM

## 2012-12-05 DIAGNOSIS — I2589 Other forms of chronic ischemic heart disease: Secondary | ICD-10-CM

## 2012-12-05 DIAGNOSIS — I213 ST elevation (STEMI) myocardial infarction of unspecified site: Secondary | ICD-10-CM

## 2012-12-05 DIAGNOSIS — I2510 Atherosclerotic heart disease of native coronary artery without angina pectoris: Secondary | ICD-10-CM

## 2012-12-05 DIAGNOSIS — I219 Acute myocardial infarction, unspecified: Secondary | ICD-10-CM

## 2012-12-05 DIAGNOSIS — E119 Type 2 diabetes mellitus without complications: Secondary | ICD-10-CM

## 2012-12-05 MED ORDER — ATORVASTATIN CALCIUM 80 MG PO TABS: 80.0000 mg | ORAL_TABLET | Freq: Every day | ORAL | Status: DC

## 2012-12-05 MED ORDER — CARVEDILOL 12.5 MG PO TABS: 12.5000 mg | ORAL_TABLET | Freq: Two times a day (BID) | ORAL | Status: DC

## 2012-12-05 NOTE — Progress Notes (Signed)
Subjective:    Patient ID: Matthew Prince, male    DOB: 1956/05/14, 57 y.o.   MRN: [id number]  HPI  57 year old patient who is seen today following a hospital discharge 2 days ago.  He was admitted on July 4 with STEMI and is status post DES to 99% LAD lesion. He has done well post hospital admission. No exertional chest pain. Tolerating his medications well. Cardiac catheterization revealed an ejection fraction of 40-45% Patient has diabetes. Recent hemoglobin A1c 7.3  Past Medical History  Diagnosis Date  . Hyperlipidemia   . Allergy   . Diabetes mellitus   . Low back pain   . Benign positional vertigo 10/2009  . CAD (coronary artery disease), native coronary artery - LAD 99% Thrombosis - PCI with Xience eXpedition 3.0 mm x `18 mm (3.4 mm post) 11/29/2012    Anterior-Inferior STEMI; 99% LAD occlusion - PCI with DES  . Cardiomyopathy, ischemic - EF ~40-45% with distal Anterior - Antero& InferoApical hypokinesis. 11/29/2012  . Presence of drug coated stent in LAD coronary artery 11/29/2012    Xience eXpedition DES 3.0 mm x 18 mm - (3.4 mm)    History   Social History  . Marital Status: Married    Spouse Name: N/A    Number of Children: N/A  . Years of Education: N/A   Occupational History  . Not on file.   Social History Main Topics  . Smoking status: Never Smoker   . Smokeless tobacco: Never Used  . Alcohol Use: No  . Drug Use: Not on file  . Sexually Active: Not on file   Other Topics Concern  . Not on file   Social History Narrative  . No narrative on file    Past Surgical History  Procedure Laterality Date  . Neck surgery      resection benign tumor  . Coronary angioplasty with stent placement  11/29/2012    99% thrombotic LAD occlusion - Xience Expedition 3.0 mm x 18 mm - post-dilated to 3.4 mm    No family history on file.  Allergies  Allergen Reactions  . Iodine     REACTION: unspecified    Current Outpatient Prescriptions on File Prior to Visit  Medication  Sig Dispense Refill  . aspirin 81 MG chewable tablet Chew 1 tablet (81 mg total) by mouth daily.      Marland Kitchen atorvastatin (LIPITOR) 40 MG tablet Take 1 tablet (40 mg total) by mouth daily.  90 tablet  6  . carvedilol (COREG) 6.25 MG tablet Take 1 tablet (6.25 mg total) by mouth 2 (two) times daily with a meal.  60 tablet  5  . glimepiride (AMARYL) 2 MG tablet Take 2 tablets (4 mg total) by mouth daily before breakfast.  90 tablet  4  . glucose blood test strip Check glucose level before each meal  ONE TOUCH VERIO TEST STRIP  100 each  12  . isosorbide mononitrate (IMDUR) 30 MG 24 hr tablet Take 1 tablet (30 mg total) by mouth daily. Take at night  30 tablet  5  . lisinopril (PRINIVIL,ZESTRIL) 2.5 MG tablet Take 1 tablet (2.5 mg total) by mouth daily. Take at night  30 tablet  5  . metFORMIN (GLUCOPHAGE) 1000 MG tablet Take 1 tablet (1,000 mg total) by mouth 2 (two) times daily with a meal.  180 tablet  6  . nitroGLYCERIN (NITROSTAT) 0.4 MG SL tablet Place 1 tablet (0.4 mg total) under the tongue every 5 (five) minutes  as needed for chest pain.  25 tablet  2  . saxagliptin HCl (ONGLYZA) 5 MG TABS tablet Take 1 tablet (5 mg total) by mouth daily.  30 tablet  6  . Ticagrelor (BRILINTA) 90 MG TABS tablet Take 1 tablet (90 mg total) by mouth 2 (two) times daily.  60 tablet  10   No current facility-administered medications on file prior to visit.    BP 120/70  Pulse 91  Temp(Src) 98.2 F (36.8 C) (Oral)  Resp 18  Wt 181 lb (82.101 kg)  BMI 27.53 kg/m2  SpO2 97%       Review of Systems  Constitutional: Positive for fatigue. Negative for fever, chills and appetite change.  HENT: Negative for hearing loss, ear pain, congestion, sore throat, trouble swallowing, neck stiffness, dental problem, voice change and tinnitus.   Eyes: Negative for pain, discharge and visual disturbance.  Respiratory: Negative for cough, chest tightness, wheezing and stridor.   Cardiovascular: Negative for chest pain,  palpitations and leg swelling.  Gastrointestinal: Negative for nausea, vomiting, abdominal pain, diarrhea, constipation, blood in stool and abdominal distention.  Genitourinary: Negative for urgency, hematuria, flank pain, discharge, difficulty urinating and genital sores.  Musculoskeletal: Negative for myalgias, back pain, joint swelling, arthralgias and gait problem.  Skin: Negative for rash.  Neurological: Negative for dizziness, syncope, speech difficulty, weakness, numbness and headaches.  Hematological: Negative for adenopathy. Does not bruise/bleed easily.  Psychiatric/Behavioral: Negative for behavioral problems and dysphoric mood. The patient is not nervous/anxious.        Objective:   Physical Exam  Constitutional: He is oriented to person, place, and time. He appears well-developed.  Pressure 130/76  HENT:  Head: Normocephalic.  Right Ear: External ear normal.  Left Ear: External ear normal.  Eyes: Conjunctivae and EOM are normal.  Neck: Normal range of motion.  Cardiovascular: Normal rate, normal heart sounds and intact distal pulses.   Pulse 90  Pulmonary/Chest: Breath sounds normal.  Abdominal: Bowel sounds are normal.  Right groin unremarkable  Musculoskeletal: Normal range of motion. He exhibits no edema and no tenderness.  Neurological: He is alert and oriented to person, place, and time.  Psychiatric: He has a normal mood and affect. His behavior is normal.          Assessment & Plan:   Status post STEMI with DES to LAD Ischemic heart myopathy with ejection fraction of 40-45% at the time of heart catheterization Diabetes mellitus  Increase Coreg to 12.5 mg twice a day Increase atorvastatin to 80 mg daily  Follow cardiology as scheduled in one week Cardiac rehabilitation  Followup here 3 months or as needed

## 2012-12-06 ENCOUNTER — Ambulatory Visit: Payer: Managed Care, Other (non HMO) | Admitting: Internal Medicine

## 2012-12-09 ENCOUNTER — Telehealth: Payer: Self-pay | Admitting: Cardiology

## 2012-12-09 NOTE — Telephone Encounter (Signed)
Message forwarded to S. Martin, RN.  

## 2012-12-09 NOTE — Telephone Encounter (Signed)
Wants to know if his papers are ready! Matthew Prince they are papers he needs to turn in to [employer]! Please call if they are ready!

## 2012-12-09 NOTE — Discharge Summary (Signed)
S/P ~ anterior STEMI by physiology, but Inferior - Inferolateral by ECG.  Did well.  Mildly reduced EF - can be reassessed as OP.  Marykay Lex, MD

## 2012-12-10 NOTE — Telephone Encounter (Signed)
Per Ermalene Searing, RN papers received and will work on tomorrow.    Returned call.  Pt informed papers received and nurse working on them.  Should be completed tomorrow.  Pt verbalized understanding and agreed w/ plan.

## 2012-12-10 NOTE — Telephone Encounter (Signed)
Wants to know if you received the papers he left here last Wednesday?If so are they ready?Need them asap-will not get paid unless he gets these papers!Please call when they are ready!

## 2012-12-11 ENCOUNTER — Telehealth: Payer: Self-pay | Admitting: *Deleted

## 2012-12-11 NOTE — Telephone Encounter (Signed)
Informed patient that his FMLA form is ready to be picked. Pt states he will come and pick it up.

## 2012-12-16 ENCOUNTER — Encounter: Payer: Self-pay | Admitting: Cardiology

## 2012-12-16 ENCOUNTER — Ambulatory Visit (INDEPENDENT_AMBULATORY_CARE_PROVIDER_SITE_OTHER): Payer: Managed Care, Other (non HMO) | Admitting: Cardiology

## 2012-12-16 VITALS — BP 128/80 | HR 77 | Ht 68.0 in | Wt 179.5 lb

## 2012-12-16 DIAGNOSIS — I10 Essential (primary) hypertension: Secondary | ICD-10-CM

## 2012-12-16 DIAGNOSIS — I2589 Other forms of chronic ischemic heart disease: Secondary | ICD-10-CM

## 2012-12-16 DIAGNOSIS — Z9861 Coronary angioplasty status: Secondary | ICD-10-CM

## 2012-12-16 DIAGNOSIS — I255 Ischemic cardiomyopathy: Secondary | ICD-10-CM

## 2012-12-16 DIAGNOSIS — I213 ST elevation (STEMI) myocardial infarction of unspecified site: Secondary | ICD-10-CM

## 2012-12-16 DIAGNOSIS — I2510 Atherosclerotic heart disease of native coronary artery without angina pectoris: Secondary | ICD-10-CM

## 2012-12-16 DIAGNOSIS — E785 Hyperlipidemia, unspecified: Secondary | ICD-10-CM

## 2012-12-16 DIAGNOSIS — I219 Acute myocardial infarction, unspecified: Secondary | ICD-10-CM

## 2012-12-16 NOTE — Patient Instructions (Addendum)
Your physician wants you to follow-up in 3  Months.  You will receive a reminder letter in the mail two months in advance. If you don't receive a letter, please call our office to schedule the follow-up appointment.  Your physician has requested that you have an echocardiogram. Echocardiography is a painless test that uses sound waves to create images of your heart. It provides your doctor with information about the size and shape of your heart and how well your heart's chambers and valves are working. This procedure takes approximately one hour. There are no restrictions for this procedure. 3 months then follow up with appointment.   Your physician recommends that you continue on your current medications as directed. Please refer to the Current Medication list given to you today.  YOU MAY RETURN TO WORK July 28,2014, with restriction of no lifting of 15lbs or less for 3 weeks then normal  Activity. CONTINUE WITH cardiac rehab

## 2012-12-22 ENCOUNTER — Encounter: Payer: Self-pay | Admitting: Cardiology

## 2012-12-22 DIAGNOSIS — I10 Essential (primary) hypertension: Secondary | ICD-10-CM | POA: Insufficient documentation

## 2012-12-22 NOTE — Assessment & Plan Note (Addendum)
He sees a doing quite well.  No recurrent anginal symptoms.  No heart failure symptoms.  He is on dual antiplatelet therapy (DAPT), beta blocker at a stable dose, ACE inhibitor and long-acting nitrate. I think in the near future.  When we see him back in followup.  He can probably stop the Imdur in lieu of increasing up his ACE inhibitor.  Plan: Continue DAPT for 12-18 months and reassess.  Likely due to the proximal LAD location, would want to keep him on some type of dual therapy.  Future plans: DC Imdur, increase ACE inhibitor

## 2012-12-22 NOTE — Assessment & Plan Note (Signed)
On high-dose atorvastatin.  We can recheck his labs after I see him back in roughly 3 months.

## 2012-12-22 NOTE — Assessment & Plan Note (Signed)
Overall stable, blood pressure.  On carvedilol, and ACE inhibitor.  See findings above for possibly titrating up ACE inhibitor and stopping nitrate in the future.

## 2012-12-22 NOTE — Assessment & Plan Note (Signed)
No active heart failure symptoms.  His EF was by LV gram.  I did not do an echocardiogram in the hospital, because it was not as low as anticipated.  Based on his presentation.  I would like to get a baseline echocardiogram on him, will like to give a full 3 months to recover.  He is now on good medications, and hopefully, there'll be some recovery of function with minimal scar, due to the relatively rapid onset of symptoms did PCI time.

## 2012-12-22 NOTE — Progress Notes (Signed)
Patient ID: Matthew Prince, male   DOB: 1955-07-04, 57 y.o.   MRN: [id number]  PCP: Rogelia Boga, MD  Clinic Note: Chief Complaint  Patient presents with  . Follow-up    ,post hosp MI,no chest pain ,no sob ,no edema,unremarkable right groin site     HPI: Matthew Prince is a 57 y.o. male status post recent inferior MI, but without possible LAD lesion treated with a DES stent who presents today for his initial post hospital followup.  He was discharged on July 8, this day, along with you to and her in ordering his antiplatelet agent.  He had no complications.   Interval History: He comes in today, feeling great.  He has not had any further chest discomfort or shortness of breath at rest and exertion.  No PND, orthopnea, or edema to suggest heart failure.  No palpitations, or lightheadedness, dizziness, or wooziness, near-syncope/syncope.  He is not having bleeding issues with his dual antiplatelet therapy  -- no melena, hematochezia, or hematuria.  He's actually been much more active than noted in the first 2 weeks.  He is active and doing a significant amount of exercise, walking briskly, pushing a lawnmower, etc. his walk as many as 3 miles a day.  He does on the leg at work, because of his disability is not reimbursed in the same as working full-time.  One thing I was concerned of, based on his initial presentation, was his emotional/psychological handling of this event.  He was initially very anxious and nervous even on day one post MI.  He remained very apprehensive at the time of discharge.  Past Medical History  Diagnosis Date  . Hyperlipidemia   . Allergy   . Diabetes mellitus   . CAD S/P percutaneous coronary angioplasty     PCI to LAD, the DES  . HISTORY OF: ST elevation myocardial infarction (STEMI) of inferior wall 11/29/2012     99% LAD occlusion - PCI with DES  . Cardiomyopathy, ischemic - EF ~40-45% with distal Anterior - Antero& InferoApical hypokinesis. 11/29/2012  .  Presence of drug coated stent in LAD coronary artery 11/29/2012    Xience eXpedition DES 3.0 mm x 18 mm - (3.4 mm)  . BPV (benign positional vertigo) 10/2009  . Low back pain   . Hypertension, essential     Prior Cardiac Evaluation and Past Surgical History: Past Surgical History  Procedure Laterality Date  . Neck surgery      resection benign tumor  . Coronary angioplasty with stent placement  11/29/2012    99% thrombotic LAD occlusion - Xience Expedition 3.0 mm x 18 mm - post-dilated to 3.4 mm    Allergies  Allergen Reactions  . Iodine     REACTION: unspecified    Current Outpatient Prescriptions  Medication Sig Dispense Refill  . aspirin 81 MG chewable tablet Chew 1 tablet (81 mg total) by mouth daily.      Marland Kitchen atorvastatin (LIPITOR) 80 MG tablet Take 1 tablet (80 mg total) by mouth daily.  90 tablet  3  . carvedilol (COREG) 12.5 MG tablet Take 1 tablet (12.5 mg total) by mouth 2 (two) times daily with a meal.  60 tablet  3  . glimepiride (AMARYL) 2 MG tablet Take 4 mg by mouth 2 (two) times daily.      Marland Kitchen glucose blood test strip Check glucose level before each meal  ONE TOUCH VERIO TEST STRIP  100 each  12  . isosorbide mononitrate (  IMDUR) 30 MG 24 hr tablet Take 1 tablet (30 mg total) by mouth daily. Take at night  30 tablet  5  . lisinopril (PRINIVIL,ZESTRIL) 2.5 MG tablet Take 1 tablet (2.5 mg total) by mouth daily. Take at night  30 tablet  5  . metFORMIN (GLUCOPHAGE) 1000 MG tablet Take 1 tablet (1,000 mg total) by mouth 2 (two) times daily with a meal.  180 tablet  6  . nitroGLYCERIN (NITROSTAT) 0.4 MG SL tablet Place 1 tablet (0.4 mg total) under the tongue every 5 (five) minutes as needed for chest pain.  25 tablet  2  . saxagliptin HCl (ONGLYZA) 5 MG TABS tablet Take 1 tablet (5 mg total) by mouth daily.  30 tablet  6  . Ticagrelor (BRILINTA) 90 MG TABS tablet Take 1 tablet (90 mg total) by mouth 2 (two) times daily.  60 tablet  10   No current facility-administered  medications for this visit.    History   Social History  . Marital Status: Married    Spouse Name: N/A    Number of Children: N/A  . Years of Education: N/A   Occupational History  . Not on file.   Social History Main Topics  . Smoking status: Never Smoker   . Smokeless tobacco: Never Used  . Alcohol Use: No  . Drug Use: Not on file  . Sexually Active: Not on file   Other Topics Concern  . Not on file   Social History Narrative   He is married, lives in [place].  He works for [employer].   He is now back very active, walking several miles a day.  He is anxious to go back to work and to start cardiac rehabilitation.  He does not drink and does not smoke.   ROS: A comprehensive Review of Systems - Negative except Pertinent positives noted above.  PHYSICAL EXAM BP 128/80  Pulse 77  Ht 5\' 8"  (1.727 m)  Wt 81.421 kg (179 lb 8 oz)  BMI 27.3 kg/m2 General appearance: alert, cooperative, appears stated age, no distress and Otherwise healthy-appearing.  Well-nourished, well-groomed.  Normal mood and affect. Neck: no adenopathy, no carotid bruit, no JVD, supple, symmetrical, trachea midline and thyroid not enlarged, symmetric, no tenderness/mass/nodules Lungs: clear to auscultation bilaterally, normal percussion bilaterally and Nonlabored, good air movement. Heart: regular rate and rhythm, S1, S2 normal, no murmur, click, rub or gallop and normal apical impulse Abdomen: soft, non-tender; bowel sounds normal; no masses,  no organomegaly Extremities: extremities normal, atraumatic, no cyanosis or edema Pulses: 2+ and symmetric Neurologic: Alert and oriented X 3, normal strength and tone. Normal symmetric reflexes. Normal coordination and gait HEENT: [place]/AT, EOMI, MMM, anicteric sclera  [id number] today: Yes Rate: 77 , Rhythm: Normal sinus rhythm with First-Degree AV Block; Left Anterior Fascicular Block and nonspecific ST-T changes  No Recent Labs    ASSESSMENT/ PLAN: Very stable.  Status post MI with PCI.  CAD (coronary artery disease), native coronary artery - (STEMI) LAD 99% Thrombosis - PCI with Xience eXpedition 3.0 mm x `18 mm (3.4 mm post) He sees a doing quite well.  No recurrent anginal symptoms.  No heart failure symptoms.  He is on dual antiplatelet therapy (DAPT), beta blocker at a stable dose, ACE inhibitor and long-acting nitrate. I think in the near future.  When we see him back in followup.  He can probably stop the Imdur in lieu of increasing up his ACE inhibitor.  Plan: Continue DAPT  for 12-18 months and reassess.  Likely due to the proximal LAD location, would want to keep him on some type of dual therapy.  Future plans: DC Imdur, increase ACE inhibitor   Cardiomyopathy, ischemic - EF ~40-45% with distal Anterior - Antero& InferoApical hypokinesis. No active heart failure symptoms.  His EF was by LV gram.  I did not do an echocardiogram in the hospital, because it was not as low as anticipated.  Based on his presentation.  I would like to get a baseline echocardiogram on him, will like to give a full 3 months to recover.  He is now on good medications, and hopefully, there'll be some recovery of function with minimal scar, due to the relatively rapid onset of symptoms did PCI time.  DYSLIPIDEMIA On high-dose atorvastatin.  We can recheck his labs after I see him back in roughly 3 months.  History of: STEMI (ST elevation myocardial infarction), ant wall He is due for starting cardiac rehabilitation, probably next week.  I actually: 1 Back off on how much exercise has been doing so far. By, then he'll be roughly 3 weeks out from his MI.  He can return back to work on Monday with the limitation of  no greater than 15 pounds of lifting and no significant exertion or stress.  3 weeks after that, he is back to full level of activities and work..  Hypertension, essential Overall stable, blood pressure.  On carvedilol, and  ACE inhibitor.  See findings above for possibly titrating up ACE inhibitor and stopping nitrate in the future.    Orders Placed This Encounter  Procedures  . EKG 12-Lead  . 2D Echocardiogram without contrast    Standing Status: Future     Number of Occurrences:      Standing Expiration Date: 12/16/2013    Order Specific Question:  Type of Echo    Answer:  Complete    Order Specific Question:  Where should this test be performed    Answer:  [facility]    Order Specific Question:  Reason for exam-Echo    Answer:  Cardiomyopathy-Ischemic  414.8    Followup: 3 months  Matthew Prince, M.D., M.S. [facility] [address] [place], [place]  [address]  [phone] Pager # [phone]

## 2012-12-22 NOTE — Assessment & Plan Note (Signed)
He is due for starting cardiac rehabilitation, probably next week.  I actually: 1 Back off on how much exercise has been doing so far. By, then he'll be roughly 3 weeks out from his MI.  He can return back to work on Monday with the limitation of  no greater than 15 pounds of lifting and no significant exertion or stress.  3 weeks after that, he is back to full level of activities and work.Marland Kitchen

## 2012-12-31 ENCOUNTER — Other Ambulatory Visit: Payer: Self-pay | Admitting: Cardiology

## 2012-12-31 NOTE — Telephone Encounter (Signed)
Rx was sent to pharmacy electronically. 

## 2013-03-04 ENCOUNTER — Other Ambulatory Visit: Payer: Self-pay

## 2013-03-04 MED ORDER — TICAGRELOR 90 MG PO TABS: 90.0000 mg | ORAL_TABLET | Freq: Two times a day (BID) | ORAL | Status: DC

## 2013-03-04 NOTE — Telephone Encounter (Signed)
Rx was sent to pharmacy electronically. 

## 2013-03-05 ENCOUNTER — Other Ambulatory Visit: Payer: Self-pay | Admitting: *Deleted

## 2013-03-05 ENCOUNTER — Telehealth: Payer: Self-pay | Admitting: Cardiology

## 2013-03-05 MED ORDER — TICAGRELOR 90 MG PO TABS: 90.0000 mg | ORAL_TABLET | Freq: Two times a day (BID) | ORAL | Status: DC

## 2013-03-05 MED ORDER — SAXAGLIPTIN HCL 5 MG PO TABS: 5.0000 mg | ORAL_TABLET | Freq: Every day | ORAL | Status: DC

## 2013-03-05 NOTE — Telephone Encounter (Signed)
Please call-concerning his medicine. °

## 2013-03-05 NOTE — Telephone Encounter (Signed)
I can try a prior auth form.  But,I think converting to Plavix is also reasonable.  Marykay Lex, MD

## 2013-03-05 NOTE — Telephone Encounter (Signed)
Spoke to patient. He states he ordered his Brilinta from mail ordered and need prior authorization. He was calling to see if we received the information. He states he want be able to afford it after this refill because it will cost $208 and he has another medication that cost $170.  He did buy a week's worth ,and 16 day sample will be given to him. He is aware ---contact mail order and defer to Dr Herbie Baltimore on any changes needed with Brilinta He has an Echo appointment on 10/15 and f/u 10/23 w/Dr Elmer Ramp [facility] DELIVERY

## 2013-03-05 NOTE — Telephone Encounter (Signed)
Spoke to

## 2013-03-07 ENCOUNTER — Ambulatory Visit (INDEPENDENT_AMBULATORY_CARE_PROVIDER_SITE_OTHER): Payer: Managed Care, Other (non HMO) | Admitting: Internal Medicine

## 2013-03-07 ENCOUNTER — Encounter: Payer: Self-pay | Admitting: Internal Medicine

## 2013-03-07 VITALS — BP 142/88 | HR 79 | Temp 97.9°F | Wt 188.0 lb

## 2013-03-07 DIAGNOSIS — I10 Essential (primary) hypertension: Secondary | ICD-10-CM

## 2013-03-07 DIAGNOSIS — E119 Type 2 diabetes mellitus without complications: Secondary | ICD-10-CM

## 2013-03-07 MED ORDER — EMPAGLIFLOZIN 25 MG PO TABS: 1.0000 | ORAL_TABLET | Freq: Every day | ORAL | Status: DC

## 2013-03-07 NOTE — Patient Instructions (Signed)
Limit your sodium (Salt) intake  It is important that you exercise regularly, at least 20 minutes 3 to 4 times per week.  If you develop chest pain or shortness of breath seek  medical attention.   Please check your hemoglobin A1c every 3 months  Discontinue Amaryl (glimepiride)  Discontinue Onglyza

## 2013-03-07 NOTE — Progress Notes (Signed)
Subjective:    Patient ID: Matthew Prince, male    DOB: 04/04/1956, 57 y.o.   MRN: [id number]  HPI  57 year old patient who is seen today for followup of diabetes. He is followed closely by cardiology for coronary artery disease. He has dyslipidemia and hypertension. He is doing quite well but to objects to the high cost of his medications. He has had a nice glycemic readings with present regimen but onglyza is causing the patient $170 every 3 months  Last hemoglobin A1c 7.3. He has had rare mild hypoglycemia with blood sugars in the 50s  Past Medical History  Diagnosis Date  . Hyperlipidemia   . Allergy   . Diabetes mellitus   . CAD S/P percutaneous coronary angioplasty     PCI to LAD, the DES  . HISTORY OF: ST elevation myocardial infarction (STEMI) of inferior wall 11/29/2012     99% LAD occlusion - PCI with DES  . Cardiomyopathy, ischemic - EF ~40-45% with distal Anterior - Antero& InferoApical hypokinesis. 11/29/2012  . Presence of drug coated stent in LAD coronary artery 11/29/2012    Xience eXpedition DES 3.0 mm x 18 mm - (3.4 mm)  . BPV (benign positional vertigo) 10/2009  . Low back pain   . Hypertension, essential     History   Social History  . Marital Status: Married    Spouse Name: N/A    Number of Children: N/A  . Years of Education: N/A   Occupational History  . Not on file.   Social History Main Topics  . Smoking status: Never Smoker   . Smokeless tobacco: Never Used  . Alcohol Use: No  . Drug Use: Not on file  . Sexual Activity: Not on file   Other Topics Concern  . Not on file   Social History Narrative   He is married, lives in [place].  He works for [employer].   He is now back very active, walking several miles a day.  He is anxious to go back to work and to start cardiac rehabilitation.  He does not drink and does not smoke.    Past Surgical History  Procedure Laterality Date  . Neck surgery      resection benign tumor  .  Coronary angioplasty with stent placement  11/29/2012    99% thrombotic LAD occlusion - Xience Expedition 3.0 mm x 18 mm - post-dilated to 3.4 mm    Family History  Problem Relation Age of Onset  . Healthy      Noncontributory; he is not very aware of his family's history.    Allergies  Allergen Reactions  . Iodine     REACTION: unspecified    Current Outpatient Prescriptions on File Prior to Visit  Medication Sig Dispense Refill  . aspirin 81 MG chewable tablet Chew 1 tablet (81 mg total) by mouth daily.      Marland Kitchen atorvastatin (LIPITOR) 80 MG tablet Take 1 tablet (80 mg total) by mouth daily.  90 tablet  3  . carvedilol (COREG) 12.5 MG tablet Take 1 tablet (12.5 mg total) by mouth 2 (two) times daily with a meal.  60 tablet  3  . glimepiride (AMARYL) 2 MG tablet Take 4 mg by mouth 2 (two) times daily.      Marland Kitchen glucose blood test strip Check glucose level before each meal  ONE TOUCH VERIO TEST STRIP  100 each  12  . isosorbide mononitrate (IMDUR) 30 MG 24 hr tablet  Take 1 tablet (30 mg total) by mouth daily. Take at night  30 tablet  5  . lisinopril (PRINIVIL,ZESTRIL) 2.5 MG tablet Take 1 tablet (2.5 mg total) by mouth daily. Take at night  30 tablet  5  . metFORMIN (GLUCOPHAGE) 1000 MG tablet Take 1 tablet (1,000 mg total) by mouth 2 (two) times daily with a meal.  180 tablet  6  . nitroGLYCERIN (NITROSTAT) 0.4 MG SL tablet Place 1 tablet (0.4 mg total) under the tongue every 5 (five) minutes as needed for chest pain.  25 tablet  2  . saxagliptin HCl (ONGLYZA) 5 MG TABS tablet Take 1 tablet (5 mg total) by mouth daily.  90 tablet  1  . Ticagrelor (BRILINTA) 90 MG TABS tablet Take 1 tablet (90 mg total) by mouth 2 (two) times daily.  32 tablet  0   No current facility-administered medications on file prior to visit.    BP 142/88  Pulse 79  Temp(Src) 97.9 F (36.6 C) (Oral)  Wt 188 lb (85.276 kg)  BMI 28.59 kg/m2  SpO2 97%     Review of Systems  Constitutional: Negative for  fever, chills, appetite change and fatigue.  HENT: Negative for congestion, dental problem, ear pain, hearing loss, sore throat, tinnitus, trouble swallowing and voice change.   Eyes: Negative for pain, discharge and visual disturbance.  Respiratory: Negative for cough, chest tightness, wheezing and stridor.   Cardiovascular: Negative for chest pain, palpitations and leg swelling.  Gastrointestinal: Negative for nausea, vomiting, abdominal pain, diarrhea, constipation, blood in stool and abdominal distention.  Genitourinary: Negative for urgency, hematuria, flank pain, discharge, difficulty urinating and genital sores.  Musculoskeletal: Negative for arthralgias, back pain, gait problem, joint swelling, myalgias and neck stiffness.  Skin: Negative for rash.  Neurological: Negative for dizziness, syncope, speech difficulty, weakness, numbness and headaches.  Hematological: Negative for adenopathy. Does not bruise/bleed easily.  Psychiatric/Behavioral: Negative for behavioral problems and dysphoric mood. The patient is not nervous/anxious.        Objective:   Physical Exam  Constitutional: He is oriented to person, place, and time. He appears well-developed.  HENT:  Head: Normocephalic.  Right Ear: External ear normal.  Left Ear: External ear normal.  Eyes: Conjunctivae and EOM are normal.  Neck: Normal range of motion.  Cardiovascular: Normal rate and normal heart sounds.   Pulmonary/Chest: Breath sounds normal.  Abdominal: Bowel sounds are normal.  Musculoskeletal: Normal range of motion. He exhibits no edema and no tenderness.  Neurological: He is alert and oriented to person, place, and time.  Psychiatric: He has a normal mood and affect. His behavior is normal.          Assessment & Plan:   Diabetes mellitus. Appears to be well-controlled PERRLA check a hemoglobin A1c. We'll continue metformin therapy at a full dose of 1000 mg twice daily; we'll discontinue onglyza due to cost  considerations and substitute Jardiance once daily. He will be started on 10 mg daily for one week and then 25 mg daily. Amaryl will be discontinued

## 2013-03-10 ENCOUNTER — Telehealth: Payer: Self-pay

## 2013-03-10 NOTE — Telephone Encounter (Signed)
Per Dr. Vernon Prey request called [facility] to advise that pt onglyza had be discontinued. Spoke with the pharmacist Kipp Brood and was told that pt's order shipped on 03/06/2013 and pt will have to pay for it unfortunately.  Kipp Brood states that the onglyza will be discontinued in their system.   Called and left a message for pt to return call to make aware.

## 2013-03-10 NOTE — Telephone Encounter (Signed)
Patient medication was mailed to him on 03/06/13.left message on patient 's voice mail

## 2013-03-12 ENCOUNTER — Ambulatory Visit (HOSPITAL_COMMUNITY)
Admission: RE | Admit: 2013-03-12 | Discharge: 2013-03-12 | Disposition: A | Discharge status: Home / Self Care | Payer: Managed Care, Other (non HMO) | Source: Ambulatory Visit | Attending: Cardiology | Admitting: Cardiology

## 2013-03-12 DIAGNOSIS — I252 Old myocardial infarction: Secondary | ICD-10-CM | POA: Insufficient documentation

## 2013-03-12 DIAGNOSIS — I2510 Atherosclerotic heart disease of native coronary artery without angina pectoris: Secondary | ICD-10-CM | POA: Insufficient documentation

## 2013-03-12 DIAGNOSIS — I428 Other cardiomyopathies: Secondary | ICD-10-CM | POA: Insufficient documentation

## 2013-03-12 DIAGNOSIS — E785 Hyperlipidemia, unspecified: Secondary | ICD-10-CM | POA: Insufficient documentation

## 2013-03-12 DIAGNOSIS — I255 Ischemic cardiomyopathy: Secondary | ICD-10-CM

## 2013-03-12 DIAGNOSIS — I2589 Other forms of chronic ischemic heart disease: Secondary | ICD-10-CM

## 2013-03-12 DIAGNOSIS — I10 Essential (primary) hypertension: Secondary | ICD-10-CM | POA: Insufficient documentation

## 2013-03-12 HISTORY — PX: TRANSTHORACIC ECHOCARDIOGRAM: SHX275

## 2013-03-12 NOTE — Progress Notes (Signed)
2D Echo Performed 03/12/2013    Clearence Ped, RCS

## 2013-03-18 ENCOUNTER — Encounter: Payer: Self-pay | Admitting: Cardiology

## 2013-03-20 ENCOUNTER — Encounter: Payer: Self-pay | Admitting: Cardiology

## 2013-03-20 ENCOUNTER — Ambulatory Visit (INDEPENDENT_AMBULATORY_CARE_PROVIDER_SITE_OTHER): Payer: Managed Care, Other (non HMO) | Admitting: Cardiology

## 2013-03-20 VITALS — BP 134/82 | HR 77 | Ht 67.5 in | Wt 185.8 lb

## 2013-03-20 DIAGNOSIS — I2510 Atherosclerotic heart disease of native coronary artery without angina pectoris: Secondary | ICD-10-CM

## 2013-03-20 DIAGNOSIS — E119 Type 2 diabetes mellitus without complications: Secondary | ICD-10-CM

## 2013-03-20 DIAGNOSIS — Z9861 Coronary angioplasty status: Secondary | ICD-10-CM

## 2013-03-20 DIAGNOSIS — E785 Hyperlipidemia, unspecified: Secondary | ICD-10-CM

## 2013-03-20 DIAGNOSIS — Z955 Presence of coronary angioplasty implant and graft: Secondary | ICD-10-CM

## 2013-03-20 NOTE — Assessment & Plan Note (Signed)
Stable, LDL of 68, HDL is low, pt walking for exercise.

## 2013-03-20 NOTE — Patient Instructions (Signed)
Continue medications like you are doing.  See Dr. Herbie Baltimore in 6 months, though call and be seen for any problems.

## 2013-03-20 NOTE — Assessment & Plan Note (Signed)
Followed by PCP

## 2013-03-20 NOTE — Progress Notes (Signed)
03/20/2013   PCP: Rogelia Boga, MD   Chief Complaint  Patient presents with  . Follow-up    Echo results.  No chest pain, SOB, edema. Occas. dizziness.    Primary Cardiologist:Dr. Herbie Baltimore   HPI: 57 y/o male with a PMH of non insulin dependent DM and HLD, who presented to the [facility] ER, urgently by EMS, on 11/29/12 with a STEMI. The patient had severe left sided chest pain with associated SOB, diaphoresis, nausea, profuse vomiting and a sense of impending doom. His EKG demonstrated ~ 2mm anterior and inferior ST elevations in V3-V5 and 1.5 mm ST elevations in II, III and aVF. He was taken urgently to the cath lab for PCI. The procedure was performed by Dr. Herbie Baltimore, via the right femoral artery. He was found to have severe early mid LAD thrombotic subtotal stenosis. This was successfully treated with PTCA-PCI of the LAD lesion with a Xience eXpedition DES. The remainder of his coronaries were normal. He had moderately reduced LV function with expected anterior, apical and inferoapical hypokinesis. His EF was 40-45%. Immediately after PCI, he had resolution of his chest pain.   Last visit with Dr. Herbie Baltimore in July  and he was ordered for echo to reevaluate his ejection fraction- previously it was 40-45% post MI.  Patient has increased his activity level and has been feeling fairly well.  Denies chest pain today, denies shortness of breath. He tells me his glucoses been stable.  He is walking 2-3 times a week for 3 miles at a time.   EF is improved to normal mild ant. Hypokinesis, and diastolic dysfunction grade 1.    Allergies  Allergen Reactions  . Iodine     REACTION: unspecified    Current Outpatient Prescriptions  Medication Sig Dispense Refill  . aspirin 81 MG chewable tablet Chew 1 tablet (81 mg total) by mouth daily.      Marland Kitchen atorvastatin (LIPITOR) 80 MG tablet Take 1 tablet (80 mg total) by mouth daily.  90 tablet  3  . carvedilol (COREG) 12.5 MG tablet Take 1  tablet (12.5 mg total) by mouth 2 (two) times daily with a meal.  60 tablet  3  . Empagliflozin 25 MG TABS Take 1 tablet by mouth daily.  90 tablet  6  . glucose blood test strip Check glucose level before each meal  ONE TOUCH VERIO TEST STRIP  100 each  12  . isosorbide mononitrate (IMDUR) 30 MG 24 hr tablet Take 1 tablet (30 mg total) by mouth daily. Take at night  30 tablet  5  . lisinopril (PRINIVIL,ZESTRIL) 2.5 MG tablet Take 1 tablet (2.5 mg total) by mouth daily. Take at night  30 tablet  5  . metFORMIN (GLUCOPHAGE) 1000 MG tablet Take 1 tablet (1,000 mg total) by mouth 2 (two) times daily with a meal.  180 tablet  6  . nitroGLYCERIN (NITROSTAT) 0.4 MG SL tablet Place 1 tablet (0.4 mg total) under the tongue every 5 (five) minutes as needed for chest pain.  25 tablet  2  . Ticagrelor (BRILINTA) 90 MG TABS tablet Take 1 tablet (90 mg total) by mouth 2 (two) times daily.  32 tablet  0  . ONGLYZA 5 MG TABS tablet Take 5 mg by mouth daily.       No current facility-administered medications for this visit.    Past Medical History  Diagnosis Date  . Hyperlipidemia   . Allergy   .  Diabetes mellitus   . CAD S/P percutaneous coronary angioplasty     PCI to LAD, the DES  . HISTORY OF: ST elevation myocardial infarction (STEMI) of inferior wall 11/29/2012     99% LAD occlusion - PCI with DES  . Cardiomyopathy, ischemic - EF ~40-45% with distal Anterior - Antero& InferoApical hypokinesis. 11/29/2012  . Presence of drug coated stent in LAD coronary artery 11/29/2012    Xience eXpedition DES 3.0 mm x 18 mm - (3.4 mm)  . BPV (benign positional vertigo) 10/2009  . Low back pain   . Hypertension, essential     Past Surgical History  Procedure Laterality Date  . Neck surgery      resection benign tumor  . Coronary angioplasty with stent placement  11/29/2012    99% thrombotic LAD occlusion - Xience Expedition 3.0 mm x 18 mm - post-dilated to 3.4 mm  . Transthoracic echocardiogram  03/12/2013     Post MI: Low normal EF (50-55%), apical false tendon.  Mild HK of apical anterior and apical lateral wall.  Grade 1 diastolic dysfunction.    ZOX:WRUEAVW:UJ colds or fevers, no weight changes Skin:no rashes or ulcers HEENT:no blurred vision, no congestion CV:see HPI PUL:see HPI GI:no diarrhea constipation or melena, no indigestion GU:no hematuria, no dysuria MS:no joint pain, no claudication Neuro:no syncope, no lightheadedness Endo:+ diabetes stable, no thyroid disease  PHYSICAL EXAM BP 134/82  Pulse 77  Ht 5' 7.5" (1.715 m)  Wt 185 lb 12.8 oz (84.278 kg)  BMI 28.65 kg/m2 General:Pleasant affect, NAD Skin:Warm and dry, brisk capillary refill HEENT:normocephalic, sclera clear, mucus membranes moist Neck:supple, no JVD, no bruits  Heart:S1S2 RRR without murmur, gallup, rub or click Lungs:clear without rales, rhonchi, or wheezes WJX:BJYN, non tender, + BS, do not palpate liver spleen or masses Ext:no lower ext edema, 2+ pedal pulses, 2+ radial pulses Neuro:alert and oriented, MAE, follows commands, + facial symmetry  EKG:SR without acute changes from July EKG.  ASSESSMENT AND PLAN CAD (coronary artery disease), native coronary artery - (STEMI) LAD 99% Thrombosis - PCI with Xience eXpedition 3.0 mm x `18 mm (3.4 mm post) No chest pain, no SOB. Echo completed with normal EF and mild ant wall hypokinesis.  Grade 1 diastolic dysfunction.   DIABETES MELLITUS, TYPE II Followed by PCP.  DYSLIPIDEMIA Stable, LDL of 68, HDL is low, pt walking for exercise.  Dr. Herbie Baltimore saw the pt as well, pt still anxious after his MI though improving.  He will follow up with Dr. Herbie Baltimore in 6 months.

## 2013-03-20 NOTE — Assessment & Plan Note (Addendum)
No chest pain, no SOB. Echo completed with normal EF and mild ant wall hypokinesis.  Grade 1 diastolic dysfunction.

## 2013-03-21 ENCOUNTER — Encounter: Payer: Self-pay | Admitting: Cardiology

## 2013-03-22 NOTE — Progress Notes (Signed)
I did seen examined the patient in the clinic along with Nada Boozer, NP. I agree with her findings, history and examination as well as recommendations/plan.  This patient is well-known to me. He presented with anterior/inferior MI back in July. He had PCI to the LAD with a DES stent. His initial EF. For 40-45%, however recent echocardiogram has shown a dramatic improvement up to 50 and 55% there was just done on October 15. There is mild hypokinesis of the anterior neck to apical lateral wall. This would go along with his MI.  This is essentially his second post MI visit. He was doing great during his last visit. I actually had to have back off on some of his activities.  He denies any recurrence of anginal symptoms with rest or either exertion. No shortness of breath with rest or exertion. He is back to full level of activity and doing well.  Cardiovascular ROS: no chest pain or dyspnea on exertion negative for - edema, irregular heartbeat, loss of consciousness, murmur, orthopnea, palpitations, paroxysmal nocturnal dyspnea, rapid heart rate or shortness of breath.  He also denies any syncope/near-syncope or TIA/ amaurosis fugax symptoms. No melena, hematochezia or hematuria. No nose bleeds. No claudication symptoms.  Impression/Plan:    ICD-9-CM   1. CAD (coronary artery disease) 414.00 EKG 12-Lead  2. CAD (coronary artery disease), native coronary artery - (STEMI) LAD 99% Thrombosis - PCI with Xience eXpedition 3.0 mm x `18 mm (3.4 mm post) 414.01   3. DIABETES MELLITUS, TYPE II 250.00   4. DYSLIPIDEMIA 272.4   5. S/P primary angioplasty with coronary stent - Xience DES 3.0 mm 18 mm to mid LAD V45.82    Stable overall from a cardiac standpoint. No active heart failure symptoms. His EF is improved. No further anginal symptoms.  Continue dual antiplatelet therapy with aspirin/Brilinta. He is also on beta blocker and ACE inhibitor as well as long-acting nitrate. He was started on statin at  the time of his MI.   He should be due for repeat lipid panel at that time of his next visit. His initial post MI labs were quite good. At that time I would certainly consider reducing his Lipitor down to 40 mg daily if his labs would justify.  I recommended that he could stop his Imdur during his last visit. He continues to take it for a "security blanket ".  His diabetes is being followed by his primary physician.

## 2013-03-31 ENCOUNTER — Other Ambulatory Visit: Payer: Self-pay | Admitting: Internal Medicine

## 2013-05-03 ENCOUNTER — Other Ambulatory Visit: Payer: Self-pay | Admitting: Internal Medicine

## 2013-05-03 ENCOUNTER — Other Ambulatory Visit: Payer: Self-pay | Admitting: Cardiology

## 2013-05-05 NOTE — Telephone Encounter (Signed)
Rx was sent to pharmacy electronically. 

## 2013-05-07 ENCOUNTER — Emergency Department (HOSPITAL_COMMUNITY)
Admission: EM | Admit: 2013-05-07 | Discharge: 2013-05-07 | Disposition: A | Discharge status: Home / Self Care | Payer: Managed Care, Other (non HMO) | Attending: Emergency Medicine | Admitting: Emergency Medicine

## 2013-05-07 ENCOUNTER — Emergency Department (HOSPITAL_COMMUNITY): Payer: Managed Care, Other (non HMO)

## 2013-05-07 ENCOUNTER — Encounter (HOSPITAL_COMMUNITY): Payer: Self-pay | Admitting: Emergency Medicine

## 2013-05-07 DIAGNOSIS — E119 Type 2 diabetes mellitus without complications: Secondary | ICD-10-CM | POA: Insufficient documentation

## 2013-05-07 DIAGNOSIS — Z9861 Coronary angioplasty status: Secondary | ICD-10-CM | POA: Insufficient documentation

## 2013-05-07 DIAGNOSIS — E785 Hyperlipidemia, unspecified: Secondary | ICD-10-CM | POA: Insufficient documentation

## 2013-05-07 DIAGNOSIS — K644 Residual hemorrhoidal skin tags: Secondary | ICD-10-CM | POA: Insufficient documentation

## 2013-05-07 DIAGNOSIS — K529 Noninfective gastroenteritis and colitis, unspecified: Secondary | ICD-10-CM

## 2013-05-07 DIAGNOSIS — K5289 Other specified noninfective gastroenteritis and colitis: Secondary | ICD-10-CM | POA: Insufficient documentation

## 2013-05-07 DIAGNOSIS — K6289 Other specified diseases of anus and rectum: Secondary | ICD-10-CM

## 2013-05-07 DIAGNOSIS — I10 Essential (primary) hypertension: Secondary | ICD-10-CM | POA: Insufficient documentation

## 2013-05-07 DIAGNOSIS — N420 Calculus of prostate: Secondary | ICD-10-CM

## 2013-05-07 DIAGNOSIS — Z8669 Personal history of other diseases of the nervous system and sense organs: Secondary | ICD-10-CM | POA: Insufficient documentation

## 2013-05-07 DIAGNOSIS — Z8739 Personal history of other diseases of the musculoskeletal system and connective tissue: Secondary | ICD-10-CM | POA: Insufficient documentation

## 2013-05-07 DIAGNOSIS — I252 Old myocardial infarction: Secondary | ICD-10-CM | POA: Insufficient documentation

## 2013-05-07 DIAGNOSIS — Z79899 Other long term (current) drug therapy: Secondary | ICD-10-CM | POA: Insufficient documentation

## 2013-05-07 DIAGNOSIS — Z7982 Long term (current) use of aspirin: Secondary | ICD-10-CM | POA: Insufficient documentation

## 2013-05-07 DIAGNOSIS — I2510 Atherosclerotic heart disease of native coronary artery without angina pectoris: Secondary | ICD-10-CM | POA: Insufficient documentation

## 2013-05-07 LAB — CBC WITH DIFFERENTIAL/PLATELET
Basophils Relative: 0 % (ref 0–1)
HCT: 38.9 % — ABNORMAL LOW (ref 39.0–52.0)
Hemoglobin: 13.2 g/dL (ref 13.0–17.0)
Lymphocytes Relative: 30 % (ref 12–46)
Lymphs Abs: 2.8 10*3/uL (ref 0.7–4.0)
MCHC: 33.9 g/dL (ref 30.0–36.0)
MCV: 87 fL (ref 78.0–100.0)
Monocytes Absolute: 0.9 10*3/uL (ref 0.1–1.0)
Monocytes Relative: 9 % (ref 3–12)
Neutro Abs: 5.6 10*3/uL (ref 1.7–7.7)
Neutrophils Relative %: 59 % (ref 43–77)
RBC: 4.47 MIL/uL (ref 4.22–5.81)
RDW: 14.1 % (ref 11.5–15.5)
WBC: 9.4 10*3/uL (ref 4.0–10.5)

## 2013-05-07 LAB — POCT I-STAT, CHEM 8
BUN: 9 mg/dL (ref 6–23)
HCT: 40 % (ref 39.0–52.0)
Hemoglobin: 13.6 g/dL (ref 13.0–17.0)
Potassium: 3.6 mEq/L (ref 3.5–5.1)
Sodium: 141 mEq/L (ref 135–145)
TCO2: 27 mmol/L (ref 0–100)

## 2013-05-07 MED ORDER — SENNOSIDES-DOCUSATE SODIUM 8.6-50 MG PO TABS: 1.0000 | ORAL_TABLET | Freq: Every day | ORAL | Status: DC

## 2013-05-07 MED ORDER — CIPROFLOXACIN HCL 500 MG PO TABS
500.0000 mg | ORAL_TABLET | Freq: Once | ORAL | Status: AC
  Administered 2013-05-07: 500 mg via ORAL
  Filled 2013-05-07: qty 1

## 2013-05-07 MED ORDER — METRONIDAZOLE 500 MG PO TABS
500.0000 mg | ORAL_TABLET | Freq: Once | ORAL | Status: AC
  Administered 2013-05-07: 500 mg via ORAL
  Filled 2013-05-07: qty 1

## 2013-05-07 MED ORDER — CIPROFLOXACIN HCL 500 MG PO TABS: 500.0000 mg | ORAL_TABLET | Freq: Two times a day (BID) | ORAL | Status: DC

## 2013-05-07 MED ORDER — HYDROCODONE-ACETAMINOPHEN 5-325 MG PO TABS: 1.0000 | ORAL_TABLET | Freq: Four times a day (QID) | ORAL | Status: DC | PRN

## 2013-05-07 MED ORDER — METRONIDAZOLE 500 MG PO TABS: 500.0000 mg | ORAL_TABLET | Freq: Three times a day (TID) | ORAL | Status: DC

## 2013-05-07 NOTE — ED Notes (Signed)
Pt c/o rectal pain that started 2wks ago. Pt states he went to [facility] and was told he had hemorrhoids but his pain is getting worse. Pt rates pain 10/10.

## 2013-05-07 NOTE — ED Notes (Signed)
Pt was seen at [facility] last week and diagnosed with hemorrhoids. Pt states he continues to have sharp pains in his rectum which is constant, but worse when he feels the urge to use the restroom.

## 2013-05-07 NOTE — ED Provider Notes (Signed)
CSN: [id number]     Arrival date & time 05/07/13  1429 History   None  This chart was scribed for Jaynie Crumble PA-C, a non-physician practitioner working with Shanna Cisco, MD by Lewanda Rife, ED Scribe. This patient was seen in room TR11C/TR11C and the patient's care was started at 3:12 PM       Chief Complaint  Patient presents with  . Rectal Pain   (Consider location/radiation/quality/duration/timing/severity/associated sxs/prior Treatment) The history is provided by the patient. No language interpreter was used.   HPI Comments: Matthew Prince is a 57 y.o. male who presents to the Emergency Department complaining of constant worsening rectal pain onset last week. Describes pain as sharp and severe. Reports was evaluated at [facility] last week and given dx of hemorrhoids. Reports pain is exacerbated with bowel movements. Reports trying sitz baths, and hydrocortisone with mild relief of symptoms. Denies associated fever, injury, and blood in stool. Denies PMHx of the same.   Past Medical History  Diagnosis Date  . Hyperlipidemia   . Allergy   . Diabetes mellitus   . CAD S/P percutaneous coronary angioplasty     PCI to LAD, the DES  . HISTORY OF: ST elevation myocardial infarction (STEMI) of inferior wall 11/29/2012     99% LAD occlusion - PCI with DES  . Cardiomyopathy, ischemic - EF ~40-45% with distal Anterior - Antero& InferoApical hypokinesis. 11/29/2012  . Presence of drug coated stent in LAD coronary artery 11/29/2012    Xience eXpedition DES 3.0 mm x 18 mm - (3.4 mm)  . BPV (benign positional vertigo) 10/2009  . Low back pain   . Hypertension, essential    Past Surgical History  Procedure Laterality Date  . Neck surgery      resection benign tumor  . Coronary angioplasty with stent placement  11/29/2012    99% thrombotic LAD occlusion - Xience Expedition 3.0 mm x 18 mm - post-dilated to 3.4 mm  . Transthoracic echocardiogram  03/12/2013    Post MI: Low normal EF  (50-55%), apical false tendon.  Mild HK of apical anterior and apical lateral wall.  Grade 1 diastolic dysfunction.   Family History  Problem Relation Age of Onset  . Healthy      Noncontributory; he is not very aware of his family's history.   History  Substance Use Topics  . Smoking status: Never Smoker   . Smokeless tobacco: Never Used  . Alcohol Use: No    Review of Systems  Constitutional: Negative for fever.  Gastrointestinal: Negative for blood in stool.  Psychiatric/Behavioral: Negative for confusion.   A complete 10 system review of systems was obtained and all systems are negative except as noted in the HPI and PMHx.    Allergies  Iodine  Home Medications   Current Outpatient Rx  Name  Route  Sig  Dispense  Refill  . aspirin 81 MG chewable tablet   Oral   Chew 1 tablet (81 mg total) by mouth daily.         Marland Kitchen atorvastatin (LIPITOR) 80 MG tablet   Oral   Take 1 tablet (80 mg total) by mouth daily.   90 tablet   3   . carvedilol (COREG) 12.5 MG tablet      TAKE 1 TABLET BY MOUTH TWO TIMES A DAY WITH A MEAL   60 tablet   3   . Empagliflozin 25 MG TABS   Oral   Take 1 tablet by mouth daily.  90 tablet   6   . glucose blood test strip      Check glucose level before each meal  ONE TOUCH VERIO TEST STRIP   100 each   12     Diagnosis: 250.00   . isosorbide mononitrate (IMDUR) 30 MG 24 hr tablet   Oral   Take 1 tablet (30 mg total) by mouth daily. Take at night   30 tablet   5   . lisinopril (PRINIVIL,ZESTRIL) 2.5 MG tablet      TAKE 1 TABLET BY MOUTH DAILY AT NIGHT   30 tablet   10   . metFORMIN (GLUCOPHAGE) 1000 MG tablet      TAKE 1 TABLET BY MOUTH 2 TIMES DAILY WITH MEALS   180 tablet   1   . nitroGLYCERIN (NITROSTAT) 0.4 MG SL tablet   Sublingual   Place 1 tablet (0.4 mg total) under the tongue every 5 (five) minutes as needed for chest pain.   25 tablet   2   . ONGLYZA 5 MG TABS tablet   Oral   Take 5 mg by mouth  daily.         . Ticagrelor (BRILINTA) 90 MG TABS tablet   Oral   Take 1 tablet (90 mg total) by mouth 2 (two) times daily.   32 tablet   0     Lot [id number],EXP 07/2015 X4    BP 145/78  Pulse 83  Temp(Src) 97.8 F (36.6 C) (Oral)  Resp 20  Wt 181 lb 1 oz (82.129 kg)  SpO2 96% Physical Exam  Nursing note and vitals reviewed. Constitutional: He is oriented to person, place, and time. He appears well-developed and well-nourished. No distress.  HENT:  Head: Normocephalic and atraumatic.  Eyes: EOM are normal.  Neck: Neck supple. No tracheal deviation present.  Cardiovascular: Normal rate.   Pulmonary/Chest: Effort normal. No respiratory distress.  Genitourinary: Rectal exam shows external hemorrhoid and tenderness.  2 external hemorrhoids both are soft and non-tender TTP of sphincter   Musculoskeletal: Normal range of motion.  Neurological: He is alert and oriented to person, place, and time.  Skin: Skin is warm and dry.  Psychiatric: He has a normal mood and affect. His behavior is normal.    ED Course  Procedures (including critical care time)  COORDINATION OF CARE:  Nursing notes reviewed. Vital signs reviewed. Initial pt interview and examination performed.  Treatment plan initiated:Medications - No data to display   Initial diagnostic testing ordered.    Labs Review Labs Reviewed  CBC WITH DIFFERENTIAL - Abnormal; Notable for the following:    HCT 38.9 (*)    All other components within normal limits  POCT I-STAT, CHEM 8 - Abnormal; Notable for the following:    Glucose, Bld 103 (*)    Calcium, Ion 1.25 (*)    All other components within normal limits   Imaging Review Ct Pelvis Wo Contrast  05/07/2013   CLINICAL DATA:  Rectal pain  EXAM: CT PELVIS WITHOUT CONTRAST  TECHNIQUE: Multidetector CT imaging of the pelvis was performed following the standard protocol without intravenous contrast. Oral contrast was not administered. Intravenous contrast was not  administered due to history of allergic reaction to iodinated contrast material.  COMPARISON:  None.  FINDINGS: There is no mass or fluid collection in the lower abdomen or pelvis. Urinary bladder is midline with normal wall thickness. Prostate is slightly prominent with several small prostatic calculi present.  There is mild thickening of the  wall of the rectum and distal sigmoid colon. There is no abscess appreciable. There is no mesenteric stranding in the perirectal or perianal regions.  Visualized bowel loops appear normal without evidence suggesting obstruction. There is atherosclerotic change in both common and internal iliac arteries. There are no blastic or lytic bone lesions. There is fat in both inguinal rings, more on the right than on the left.  IMPRESSION: There is some mild wall of the distal sigmoid colon and rectum. There may be distal colitis and proctitis. Note, however, that no abscess or surrounding mesenteric stranding seen.  Elsewhere, there is no mass or abscess in the pelvis. Urinary bladder is midline. Prostate is noted to be bowel but prominent with several prostatic calculi present. This finding may warrant correlation with PSA.   Electronically Signed   By: Bretta Bang M.D.   On: 05/07/2013 17:21    EKG Interpretation   None       MDM   1. Proctitis   2. Colitis   3. Stones, prostate     Patient presented to ER with 2 weeks ongoing rectal pain is worsening. He did have external hemorrhoids on exam, however they're nontender nontender most. He did have severe tenderness of the rectal sphincter and I was unable to do digital rectal exam. CT obtained which showed most likely proctitis and distal colitis. Pt was started on cipro, flagyl, pain medications, follow up with PCP.   Filed Vitals:   05/07/13 1433  BP: 145/78  Pulse: 83  Temp: 97.8 F (36.6 C)  TempSrc: Oral  Resp: 20  Weight: 181 lb 1 oz (82.129 kg)  SpO2: 96%     I personally performed the  services described in this documentation, which was scribed in my presence. The recorded information has been reviewed and is accurate.    Lottie Mussel, PA-C 05/08/13 0106

## 2013-05-07 NOTE — ED Notes (Signed)
Discharge instructions reviewed with pt. Pt verbalized understanding.   

## 2013-05-08 NOTE — ED Provider Notes (Signed)
Medical screening examination/treatment/procedure(s) were performed by non-physician practitioner and as supervising physician I was immediately available for consultation/collaboration.  EKG Interpretation   None         Shanna Cisco, MD 05/08/13 [phone]

## 2013-05-19 ENCOUNTER — Telehealth: Payer: Self-pay | Admitting: *Deleted

## 2013-05-19 ENCOUNTER — Other Ambulatory Visit: Payer: Self-pay | Admitting: *Deleted

## 2013-05-19 MED ORDER — CIPROFLOXACIN HCL 500 MG PO TABS: 500.0000 mg | ORAL_TABLET | Freq: Two times a day (BID) | ORAL | Status: DC

## 2013-05-19 NOTE — Telephone Encounter (Signed)
Pt walked in complaining of prostate pain,stasting he had been to an urgent care and to ed at [facility].  States he completed his antibioitc yesterday and continues to have prostate pain.  Ct at [facility] showed prostatic calculi.  Talked with dr Kirtland Bouchard and gave him cipro 500 bid for 30 days and made an appointment for him with [facility],

## 2013-05-20 ENCOUNTER — Ambulatory Visit (INDEPENDENT_AMBULATORY_CARE_PROVIDER_SITE_OTHER): Payer: Managed Care, Other (non HMO) | Admitting: Surgery

## 2013-05-20 VITALS — BP 136/82 | HR 86 | Temp 98.7°F | Resp 18 | Ht 69.0 in | Wt 181.0 lb

## 2013-05-20 DIAGNOSIS — K611 Rectal abscess: Secondary | ICD-10-CM

## 2013-05-20 DIAGNOSIS — K612 Anorectal abscess: Secondary | ICD-10-CM

## 2013-05-20 MED ORDER — OXYCODONE-ACETAMINOPHEN 5-325 MG PO TABS: 1.0000 | ORAL_TABLET | ORAL | Status: DC | PRN

## 2013-05-20 NOTE — Progress Notes (Signed)
URGENT Office [place] 57 y.o.  Body mass index is 26.72 kg/(m^2).  Patient Active Problem List   Diagnosis Date Noted  . Perirectal abscess 05/20/2013    Priority: High  . Hypertension, essential   . Cardiomyopathy, ischemic - EF ~40-45% with distal Anterior - Antero& InferoApical hypokinesis. 11/30/2012  . History of: STEMI (ST elevation myocardial infarction), ant wall 11/29/2012  . CAD (coronary artery disease), native coronary artery - (STEMI) LAD 99% Thrombosis - PCI with Xience eXpedition 3.0 mm x `18 mm (3.4 mm post) 11/29/2012  . Special screening for malignant neoplasms, colon 01/09/2011  . Benign neoplasm of colon 01/09/2011  . Diverticulosis of colon (without mention of hemorrhage) 01/09/2011  . DIABETES MELLITUS, TYPE II 07/23/2008  . DYSLIPIDEMIA 07/23/2008  . ALLERGIC RHINITIS 01/15/2007  . LOW BACK PAIN 01/15/2007    Allergies  Allergen Reactions  . Iodine     REACTION: unspecified    Past Surgical History  Procedure Laterality Date  . Neck surgery      resection benign tumor  . Coronary angioplasty with stent placement  11/29/2012    99% thrombotic LAD occlusion - Xience Expedition 3.0 mm x 18 mm - post-dilated to 3.4 mm  . Transthoracic echocardiogram  03/12/2013    Post MI: Low normal EF (50-55%), apical false tendon.  Mild HK of apical anterior and apical lateral wall.  Grade 1 diastolic dysfunction.   Rogelia Boga, MD 1. Perirectal abscess     Mr. Deasis was sent over from [facility] with a perirectal abscess.  He has had problem with pain in this area for several days or weeks. He had grown severe and very debilitating and he pointed to a fluctuant area posteriorly on the right side. This area was infiltrated with a mixture of lidocaine and sodium bicarbonate and then the area was opened with a radial incision and copious pus flowed out. Following the drainage his pain was improved. He will stay on his antibiotics which included Flagyl  and Cipro prescribed by Dr. Lovell Sheehan and I will give him some narcotic for pain. We will see him back in about a week and a half. In the meantime he is to be sitting in the tunnel warm water for frequent bouts the key. Clean. Matt B. Daphine Deutscher, MD, [facility] [phone] beeper [phone]  05/20/2013 6:08 PM

## 2013-05-20 NOTE — Patient Instructions (Signed)
Sit in top of warm water frequently. Take antibiotics as directed.

## 2013-05-28 ENCOUNTER — Other Ambulatory Visit: Payer: Self-pay | Admitting: Cardiology

## 2013-05-28 NOTE — Telephone Encounter (Signed)
Rx was sent to pharmacy electronically. 

## 2013-05-30 ENCOUNTER — Telehealth: Payer: Self-pay | Admitting: *Deleted

## 2013-05-30 NOTE — Telephone Encounter (Signed)
Pt. Called and informed that sharon was aware of this issue and would take call of this some time next week , probably Wednesday of next week

## 2013-05-30 NOTE — Telephone Encounter (Signed)
Pt is calling in regards to a Rx refill he has no idea what medication and has no # to call back. He stated that the pharmacy sent it over 3 days ago. He argued with me about this and wanted to speak to [facility] about this.

## 2013-06-05 ENCOUNTER — Ambulatory Visit (INDEPENDENT_AMBULATORY_CARE_PROVIDER_SITE_OTHER): Payer: Managed Care, Other (non HMO) | Admitting: Surgery

## 2013-06-05 ENCOUNTER — Encounter (INDEPENDENT_AMBULATORY_CARE_PROVIDER_SITE_OTHER): Payer: Self-pay | Admitting: Surgery

## 2013-06-05 VITALS — BP 138/80 | HR 80 | Temp 99.2°F | Resp 14 | Ht 68.0 in | Wt 182.4 lb

## 2013-06-05 DIAGNOSIS — K603 Anal fistula: Secondary | ICD-10-CM

## 2013-06-05 NOTE — Progress Notes (Signed)
Matthew Prince 58 y.o.  Body mass index is 27.74 kg/(m^2).  Patient Active Problem List   Diagnosis Date Noted  . Perirectal abscess 05/20/2013    Priority: High  . Hypertension, essential   . Cardiomyopathy, ischemic - EF ~40-45% with distal Anterior - Antero& InferoApical hypokinesis. 11/30/2012  . History of: STEMI (ST elevation myocardial infarction), ant wall 11/29/2012  . CAD (coronary artery disease), native coronary artery - (STEMI) LAD 99% Thrombosis - PCI with Xience eXpedition 3.0 mm x `18 mm (3.4 mm post) 11/29/2012  . Special screening for malignant neoplasms, colon 01/09/2011  . Benign neoplasm of colon 01/09/2011  . Diverticulosis of colon (without mention of hemorrhage) 01/09/2011  . DIABETES MELLITUS, TYPE II 07/23/2008  . DYSLIPIDEMIA 07/23/2008  . ALLERGIC RHINITIS 01/15/2007  . LOW BACK PAIN 01/15/2007    Allergies  Allergen Reactions  . Iodine     REACTION: unspecified    Past Surgical History  Procedure Laterality Date  . Neck surgery      resection benign tumor  . Coronary angioplasty with stent placement  11/29/2012    99% thrombotic LAD occlusion - Xience Expedition 3.0 mm x 18 mm - post-dilated to 3.4 mm  . Transthoracic echocardiogram  03/12/2013    Post MI: Low normal EF (50-55%), apical false tendon.  Mild HK of apical anterior and apical lateral wall.  Grade 1 diastolic dysfunction.   [name], MD No diagnosis found.  Had relief of pain after I drained his perirectal abscess.  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  Will give that 4 more weeks to heal before going back and going to the OR for fistulotomy.   Matt B. Hassell Done, MD, [facility] [phone] beeper [phone]  06/05/2013 12:32 PM

## 2013-06-05 NOTE — Patient Instructions (Signed)
Anal Fistula °An anal fistula is an abnormal tunnel that develops between the bowel and skin near the outside of the anus, where feces comes out. The anus has a number of tiny glands that make lubricating fluid. Sometimes these glands can become plugged and infected. This may lead to the development of a fluid-filled pocket (abscess). An anal fistula often develops after this infection or abscess. It is nearly always caused by a past or current anal abscess.  °CAUSES  °Though an anal fistula is almost always caused by a past or current anal abscess, other causes can include: °· A complication of surgery. °· Trauma to the rectal area. °· Radiation to the area. °· Other medical conditions or diseases, such as:   °· Chronic inflammatory bowel disease, such as Crohn disease or ulcerative colitis.   °· Colon or rectal cancer.   °· Diverticular disease, such as diverticulitis.   °· A sexually transmitted disease, such as gonorrhea, chlamydia, or syphilis. °· An HIV infection or AIDS.   °SYMPTOMS  °· Throbbing or constant pain that may be worse when sitting.   °· Swelling or irritation around the anus.   °· Drainage of pus or blood from an opening near the anus.   °· Pain with bowel movements. °· Fever or chills. °DIAGNOSIS  °Your caregiver will examine the area to find the openings of the anal fistula and the fistula tract. The external opening of the anal fistula may be seen during a physical examination. Other examinations that may be performed include:  °· Examination of the rectal area with a gloved hand (digital rectal exam).   °· Examination with a probe or scope to help locate the internal opening of the fistula.   °· Injection of a dye into the fistula opening. X-rays can be taken to find the exact location and path of the fistula.   °· An MRI or ultrasound of the anal area.   °Other tests may be performed to find the cause of the anal fistula.    °TREATMENT  °The most common treatment for an anal fistula is  surgery. There are different surgery options depending on where your fistula is located and how complex the fistula is. Surgical options include: °· A fistulotomy. This surgery involves opening up the whole fistula and draining the contents inside to promote healing. °· Seton placement. A silk string (seton) is placed into the fistula during a fistulotomy to drain any infection to promote healing. °· Advancement flap procedure. Tissue is removed from your rectum or the skin around the anus and is attached to the opening of the fistula. °· Bioprosthetic plug. A cone-shaped plug is made from your tissue and is used to block the opening of the fistula. °Some anal fistulas do not require surgery. A fibrin glue is a non-surgical option that involves injecting the glue to seal the fistula. You also may be prescribed an antibiotic medicine to treat an infection.  °HOME CARE INSTRUCTIONS  °· Take your antibiotics as directed. Finish them even if you start to feel better. °· Only take over-the-counter or prescription medicines as directed by your caregiver. Use a stool softener or laxative, if recommended.   °· Eat a high-fiber diet to help avoid constipation or as directed by your caregiver. °· Drink enough water to keep your urine clear or pale yellow.   °· A warm sitz bath may be soothing and help with healing. You may take warm sitz baths for 15 20 minutes, 3 4 times a day to ease pain and discomfort.   °· Follow excellent hygiene to keep the   anal area as clean and dry as possible. Use wet toilet paper or moist towelettes after each bowel movement.   °SEEK MEDICAL CARE IF: °You have increased pain not controlled with medicines.  °SEEK IMMEDIATE MEDICAL CARE IF: °· You have severe, intolerable pain. °· You have new swelling, redness, or discharge around the anal area. °· You have tenderness or warmth around the anal area. °· You have chills or diarrhea. °· You have severe problems urinating or having a bowel movement.    °· You have a fever or persistent symptoms for more than 2 3 days.   °· You have a fever and your symptoms suddenly get worse.   °MAKE SURE YOU:  °· Understand these instructions. °· Will watch your condition. °· Will get help right away if you are not doing well or get worse. °Document Released: 04/27/2008 Document Revised: 05/01/2012 Document Reviewed: 03/20/2011 °[employer]® Patient Information ©2014 [employer]. ° °

## 2013-06-06 ENCOUNTER — Encounter: Payer: Self-pay | Admitting: Internal Medicine

## 2013-06-06 ENCOUNTER — Ambulatory Visit (INDEPENDENT_AMBULATORY_CARE_PROVIDER_SITE_OTHER): Payer: Managed Care, Other (non HMO) | Admitting: Internal Medicine

## 2013-06-06 VITALS — BP 130/80 | HR 84 | Temp 97.9°F | Resp 20 | Wt 183.0 lb

## 2013-06-06 DIAGNOSIS — I255 Ischemic cardiomyopathy: Secondary | ICD-10-CM

## 2013-06-06 DIAGNOSIS — I2510 Atherosclerotic heart disease of native coronary artery without angina pectoris: Secondary | ICD-10-CM

## 2013-06-06 DIAGNOSIS — K611 Rectal abscess: Secondary | ICD-10-CM

## 2013-06-06 DIAGNOSIS — I2589 Other forms of chronic ischemic heart disease: Secondary | ICD-10-CM

## 2013-06-06 DIAGNOSIS — E785 Hyperlipidemia, unspecified: Secondary | ICD-10-CM

## 2013-06-06 DIAGNOSIS — K612 Anorectal abscess: Secondary | ICD-10-CM

## 2013-06-06 DIAGNOSIS — E119 Type 2 diabetes mellitus without complications: Secondary | ICD-10-CM

## 2013-06-06 DIAGNOSIS — I10 Essential (primary) hypertension: Secondary | ICD-10-CM

## 2013-06-06 NOTE — Progress Notes (Signed)
Subjective:    Patient ID: Matthew Prince, male    DOB: 1956/05/08, 58 y.o.   MRN: [id number]  HPI 58 year old patient Who has type 2 diabetes. He is seen today in followup. He states he has had a very nice glycemic control but no recent hemoglobin A1c. He was seen in the ED recently and has been followed by general surgery for a perirectal abscess. He has coronary artery disease which has been stable. He has been compliant with his medications. No cardiopulmonary complaints  Past Medical History  Diagnosis Date  . Hyperlipidemia   . Allergy   . Diabetes mellitus   . CAD S/P percutaneous coronary angioplasty     PCI to LAD, the DES  . HISTORY OF: ST elevation myocardial infarction (STEMI) of inferior wall 11/29/2012     99% LAD occlusion - PCI with DES  . Cardiomyopathy, ischemic - EF ~40-45% with distal Anterior - Antero& InferoApical hypokinesis. 11/29/2012  . Presence of drug coated stent in LAD coronary artery 11/29/2012    Xience eXpedition DES 3.0 mm x 18 mm - (3.4 mm)  . BPV (benign positional vertigo) 10/2009  . Low back pain   . Hypertension, essential     History   Social History  . Marital Status: Single    Spouse Name: N/A    Number of Children: N/A  . Years of Education: N/A   Occupational History  . Not on file.   Social History Main Topics  . Smoking status: Never Smoker   . Smokeless tobacco: Never Used  . Alcohol Use: No  . Drug Use: Not on file  . Sexual Activity: Not on file   Other Topics Concern  . Not on file   Social History Narrative   He is married, lives in [place].  He works for [employer].   He is now back very active, walking several miles a day.  He is anxious to go back to work and to start cardiac rehabilitation.  He does not drink and does not smoke.    Past Surgical History  Procedure Laterality Date  . Neck surgery      resection benign tumor  . Coronary angioplasty with stent placement  11/29/2012    99% thrombotic  LAD occlusion - Xience Expedition 3.0 mm x 18 mm - post-dilated to 3.4 mm  . Transthoracic echocardiogram  03/12/2013    Post MI: Low normal EF (50-55%), apical false tendon.  Mild HK of apical anterior and apical lateral wall.  Grade 1 diastolic dysfunction.    Family History  Problem Relation Age of Onset  . Healthy      Noncontributory; he is not very aware of his family's history.    Allergies  Allergen Reactions  . Iodine     REACTION: unspecified    Current Outpatient Prescriptions on File Prior to Visit  Medication Sig Dispense Refill  . aspirin 81 MG chewable tablet Chew 1 tablet (81 mg total) by mouth daily.      Marland Kitchen atorvastatin (LIPITOR) 80 MG tablet Take 1 tablet (80 mg total) by mouth daily.  90 tablet  3  . carvedilol (COREG) 12.5 MG tablet TAKE 1 TABLET BY MOUTH TWO TIMES A DAY WITH A MEAL  60 tablet  3  . ciprofloxacin (CIPRO) 500 MG tablet Take 1 tablet (500 mg total) by mouth 2 (two) times daily.  60 tablet  0  . glucose blood test strip Check glucose level before each meal  ONE TOUCH VERIO TEST STRIP  100 each  12  . hydrocortisone (ANUSOL-HC) 2.5 % rectal cream Place 1 application rectally 2 (two) times daily. For 7 days. Starred 05/02/13      . isosorbide mononitrate (IMDUR) 30 MG 24 hr tablet TAKE 1 TABLET BY MOUTH DAILY AT NIGHT  30 tablet  10  . lisinopril (PRINIVIL,ZESTRIL) 2.5 MG tablet TAKE 1 TABLET BY MOUTH DAILY AT NIGHT  30 tablet  10  . metFORMIN (GLUCOPHAGE) 1000 MG tablet TAKE 1 TABLET BY MOUTH 2 TIMES DAILY WITH MEALS  180 tablet  1  . nitroGLYCERIN (NITROSTAT) 0.4 MG SL tablet Place 1 tablet (0.4 mg total) under the tongue every 5 (five) minutes as needed for chest pain.  25 tablet  2  . ONGLYZA 5 MG TABS tablet Take 5 mg by mouth daily.      Marland Kitchen senna-docusate (SENOKOT-S) 8.6-50 MG per tablet Take 1-2 tablets by mouth daily.  30 tablet  0  . Ticagrelor (BRILINTA) 90 MG TABS tablet Take 1 tablet (90 mg total) by mouth 2 (two) times daily.  32 tablet  0     No current facility-administered medications on file prior to visit.    BP 130/80  Pulse 84  Temp(Src) 97.9 F (36.6 C) (Oral)  Resp 20  Wt 183 lb (83.008 kg)  SpO2 97%        Review of Systems  Constitutional: Negative for fever, chills, appetite change and fatigue.  HENT: Negative for congestion, dental problem, ear pain, hearing loss, sore throat, tinnitus, trouble swallowing and voice change.   Eyes: Negative for pain, discharge and visual disturbance.  Respiratory: Negative for cough, chest tightness, wheezing and stridor.   Cardiovascular: Negative for chest pain, palpitations and leg swelling.  Gastrointestinal: Negative for nausea, vomiting, abdominal pain, diarrhea, constipation, blood in stool and abdominal distention.  Genitourinary: Negative for urgency, hematuria, flank pain, discharge, difficulty urinating and genital sores.  Musculoskeletal: Negative for arthralgias, back pain, gait problem, joint swelling, myalgias and neck stiffness.  Skin: Negative for rash.  Neurological: Negative for dizziness, syncope, speech difficulty, weakness, numbness and headaches.  Hematological: Negative for adenopathy. Does not bruise/bleed easily.  Psychiatric/Behavioral: Negative for behavioral problems and dysphoric mood. The patient is not nervous/anxious.        Objective:   Physical Exam  Constitutional: He is oriented to person, place, and time. He appears well-developed.  130/80  HENT:  Head: Normocephalic.  Right Ear: External ear normal.  Left Ear: External ear normal.  Eyes: Conjunctivae and EOM are normal.  Neck: Normal range of motion.  Cardiovascular: Normal rate and normal heart sounds.   Pulmonary/Chest: Breath sounds normal.  Abdominal: Bowel sounds are normal.  Musculoskeletal: Normal range of motion. He exhibits no edema and no tenderness.  Neurological: He is alert and oriented to person, place, and time.  Psychiatric: He has a normal mood and  affect. His behavior is normal.          Assessment & Plan:  Diabetes mellitus. We'll check a hemoglobin A1c  hypertension stable  CAD  Recheck 3 months

## 2013-06-06 NOTE — Patient Instructions (Signed)
It is important that you exercise regularly, at least 20 minutes 3 to 4 times per week.  If you develop chest pain or shortness of breath seek  medical attention.   Please check your hemoglobin A1c every 3 months  Limit your sodium (Salt) intake   

## 2013-06-06 NOTE — Progress Notes (Signed)
Pre-visit discussion using our clinic review tool. No additional management support is needed unless otherwise documented below in the visit note.  

## 2013-06-07 LAB — HEMOGLOBIN A1C
Hgb A1c MFr Bld: 6.7 % — ABNORMAL HIGH (ref <5.7)
Mean Plasma Glucose: 146 mg/dL — ABNORMAL HIGH (ref <117)

## 2013-06-11 ENCOUNTER — Telehealth: Payer: Self-pay

## 2013-06-11 NOTE — Telephone Encounter (Signed)
Relevant patient education mailed to patient.  

## 2013-06-12 ENCOUNTER — Telehealth: Payer: Self-pay | Admitting: *Deleted

## 2013-06-12 NOTE — Telephone Encounter (Signed)
Left message on voicemail to call office. Paperwork done and ready for pickup.

## 2013-06-13 NOTE — Telephone Encounter (Signed)
Left detailed message paperwork ready for pickup and will be at the front desk.

## 2013-06-27 ENCOUNTER — Other Ambulatory Visit: Payer: Self-pay

## 2013-06-27 MED ORDER — LISINOPRIL 2.5 MG PO TABS: 2.5000 mg | ORAL_TABLET | Freq: Every day | ORAL | Status: DC

## 2013-06-27 MED ORDER — ISOSORBIDE MONONITRATE ER 30 MG PO TB24: 30.0000 mg | ORAL_TABLET | Freq: Every day | ORAL | Status: DC

## 2013-06-27 NOTE — Telephone Encounter (Signed)
Rx was sent to pharmacy electronically. 

## 2013-06-28 ENCOUNTER — Telehealth: Payer: Self-pay | Admitting: Internal Medicine

## 2013-06-28 NOTE — Telephone Encounter (Signed)
Matthew Prince requesting refills of the following:  ONGLYZA 5 MG TABS tablet atorvastatin (LIPITOR) 80 MG tablet metFORMIN (GLUCOPHAGE) 1000 MG tablet carvedilol (COREG) 12.5 MG tablet

## 2013-07-01 MED ORDER — CARVEDILOL 12.5 MG PO TABS: ORAL_TABLET | ORAL | Status: DC

## 2013-07-01 MED ORDER — SAXAGLIPTIN HCL 5 MG PO TABS: 5.0000 mg | ORAL_TABLET | Freq: Every day | ORAL | Status: DC

## 2013-07-01 MED ORDER — ATORVASTATIN CALCIUM 80 MG PO TABS: 80.0000 mg | ORAL_TABLET | Freq: Every day | ORAL | Status: DC

## 2013-07-01 MED ORDER — METFORMIN HCL 1000 MG PO TABS: ORAL_TABLET | ORAL | Status: DC

## 2013-07-01 NOTE — Telephone Encounter (Signed)
Rx refills sent to [facility].

## 2013-07-02 ENCOUNTER — Encounter (INDEPENDENT_AMBULATORY_CARE_PROVIDER_SITE_OTHER): Payer: Managed Care, Other (non HMO) | Admitting: Surgery

## 2013-07-03 ENCOUNTER — Encounter (INDEPENDENT_AMBULATORY_CARE_PROVIDER_SITE_OTHER): Payer: Managed Care, Other (non HMO) | Admitting: Surgery

## 2013-09-04 ENCOUNTER — Ambulatory Visit: Payer: Managed Care, Other (non HMO) | Admitting: Internal Medicine

## 2013-09-16 ENCOUNTER — Ambulatory Visit (INDEPENDENT_AMBULATORY_CARE_PROVIDER_SITE_OTHER): Payer: Managed Care, Other (non HMO) | Admitting: Internal Medicine

## 2013-09-16 ENCOUNTER — Encounter: Payer: Self-pay | Admitting: Internal Medicine

## 2013-09-16 VITALS — BP 120/80 | HR 77 | Temp 99.7°F | Ht 68.0 in | Wt 187.0 lb

## 2013-09-16 DIAGNOSIS — I213 ST elevation (STEMI) myocardial infarction of unspecified site: Secondary | ICD-10-CM

## 2013-09-16 DIAGNOSIS — I10 Essential (primary) hypertension: Secondary | ICD-10-CM

## 2013-09-16 DIAGNOSIS — E119 Type 2 diabetes mellitus without complications: Secondary | ICD-10-CM

## 2013-09-16 DIAGNOSIS — I219 Acute myocardial infarction, unspecified: Secondary | ICD-10-CM

## 2013-09-16 DIAGNOSIS — E785 Hyperlipidemia, unspecified: Secondary | ICD-10-CM

## 2013-09-16 LAB — HEMOGLOBIN A1C: Hgb A1c MFr Bld: 7.2 % — ABNORMAL HIGH (ref 4.6–6.5)

## 2013-09-16 NOTE — Progress Notes (Signed)
Pre-visit discussion using our clinic review tool. No additional management support is needed unless otherwise documented below in the visit note.  

## 2013-09-16 NOTE — Progress Notes (Signed)
Subjective:    Patient ID: Matthew Prince, male    DOB: [birth year], 58 y.o.   MRN: [id number]  HPI 58 year old patient who is seen today for cortisol up of type 2 diabetes.  He has coronary artery disease, hypertension, and dyslipidemia.  His cardiac status has been stable.  No concerns or complaints.  No recent eye examination.  Past Medical History  Diagnosis Date  . Hyperlipidemia   . Allergy   . Diabetes mellitus   . CAD S/P percutaneous coronary angioplasty     PCI to LAD, the DES  . HISTORY OF: ST elevation myocardial infarction (STEMI) of inferior wall 11/29/2012     99% LAD occlusion - PCI with DES  . Cardiomyopathy, ischemic - EF ~40-45% with distal Anterior - Antero& InferoApical hypokinesis. 11/29/2012  . Presence of drug coated stent in LAD coronary artery 11/29/2012    Xience eXpedition DES 3.0 mm x 18 mm - (3.4 mm)  . BPV (benign positional vertigo) 10/2009  . Low back pain   . Hypertension, essential     History   Social History  . Marital Status: Single    Spouse Name: N/A    Number of Children: N/A  . Years of Education: N/A   Occupational History  . Not on file.   Social History Main Topics  . Smoking status: Never Smoker   . Smokeless tobacco: Never Used  . Alcohol Use: No  . Drug Use: Not on file  . Sexual Activity: Not on file   Other Topics Concern  . Not on file   Social History Narrative   He is married, lives in [place].  He works for [employer].   He is now back very active, walking several miles a day.  He is anxious to go back to work and to start cardiac rehabilitation.  He does not drink and does not smoke.    Past Surgical History  Procedure Laterality Date  . Neck surgery      resection benign tumor  . Coronary angioplasty with stent placement  11/29/2012    99% thrombotic LAD occlusion - Xience Expedition 3.0 mm x 18 mm - post-dilated to 3.4 mm  . Transthoracic echocardiogram  03/12/2013    Post MI: Low normal EF  (50-55%), apical false tendon.  Mild HK of apical anterior and apical lateral wall.  Grade 1 diastolic dysfunction.    Family History  Problem Relation Age of Onset  . Healthy      Noncontributory; he is not very aware of his family's history.    Allergies  Allergen Reactions  . Iodine     REACTION: unspecified    Current Outpatient Prescriptions on File Prior to Visit  Medication Sig Dispense Refill  . aspirin 81 MG chewable tablet Chew 1 tablet (81 mg total) by mouth daily.      Marland Kitchen atorvastatin (LIPITOR) 80 MG tablet Take 1 tablet (80 mg total) by mouth daily.  90 tablet  1  . carvedilol (COREG) 12.5 MG tablet TAKE 1 TABLET BY MOUTH TWO TIMES A DAY WITH A MEAL  180 tablet  1  . glucose blood test strip Check glucose level before each meal  ONE TOUCH VERIO TEST STRIP  100 each  12  . isosorbide mononitrate (IMDUR) 30 MG 24 hr tablet Take 1 tablet (30 mg total) by mouth daily.  90 tablet  2  . lisinopril (PRINIVIL,ZESTRIL) 2.5 MG tablet Take 1 tablet (2.5 mg total) by mouth  daily.  90 tablet  2  . metFORMIN (GLUCOPHAGE) 1000 MG tablet TAKE 1 TABLET BY MOUTH 2 TIMES DAILY WITH MEALS  180 tablet  1  . nitroGLYCERIN (NITROSTAT) 0.4 MG SL tablet Place 1 tablet (0.4 mg total) under the tongue every 5 (five) minutes as needed for chest pain.  25 tablet  2  . saxagliptin HCl (ONGLYZA) 5 MG TABS tablet Take 1 tablet (5 mg total) by mouth daily.  90 tablet  1  . Ticagrelor (BRILINTA) 90 MG TABS tablet Take 1 tablet (90 mg total) by mouth 2 (two) times daily.  32 tablet  0  . hydrocortisone (ANUSOL-HC) 2.5 % rectal cream Place 1 application rectally 2 (two) times daily. For 7 days. Starred 05/02/13       No current facility-administered medications on file prior to visit.    BP 120/80  Pulse 77  Temp(Src) 99.7 F (37.6 C) (Oral)  Ht 5\' 8"  (1.727 m)  Wt 187 lb (84.823 kg)  BMI 28.44 kg/m2  SpO2 97%      Review of Systems  Constitutional: Negative for fever, chills, appetite change  and fatigue.  HENT: Negative for congestion, dental problem, ear pain, hearing loss, sore throat, tinnitus, trouble swallowing and voice change.   Eyes: Negative for pain, discharge and visual disturbance.  Respiratory: Negative for cough, chest tightness, wheezing and stridor.   Cardiovascular: Negative for chest pain, palpitations and leg swelling.  Gastrointestinal: Negative for nausea, vomiting, abdominal pain, diarrhea, constipation, blood in stool and abdominal distention.  Genitourinary: Negative for urgency, hematuria, flank pain, discharge, difficulty urinating and genital sores.  Musculoskeletal: Negative for arthralgias, back pain, gait problem, joint swelling, myalgias and neck stiffness.  Skin: Negative for rash.  Neurological: Negative for dizziness, syncope, speech difficulty, weakness, numbness and headaches.  Hematological: Negative for adenopathy. Does not bruise/bleed easily.  Psychiatric/Behavioral: Negative for behavioral problems and dysphoric mood. The patient is not nervous/anxious.        Objective:   Physical Exam  Constitutional: He is oriented to person, place, and time. He appears well-developed.  HENT:  Head: Normocephalic.  Right Ear: External ear normal.  Left Ear: External ear normal.  Eyes: Conjunctivae and EOM are normal.  Neck: Normal range of motion.  Cardiovascular: Normal rate and normal heart sounds.   Pulmonary/Chest: Breath sounds normal.  Abdominal: Bowel sounds are normal.  Musculoskeletal: Normal range of motion. He exhibits no edema and no tenderness.  Neurological: He is alert and oriented to person, place, and time.  Psychiatric: He has a normal mood and affect. His behavior is normal.          Assessment & Plan:   Diabetes mellitus.  We'll check a hemoglobin A1c Hypertension well controlled CAD stable  Eye examination recommended Recheck 3 months

## 2013-09-16 NOTE — Patient Instructions (Signed)
Limit your sodium (Salt) intake   Please check your hemoglobin A1c every 3 months   

## 2013-09-17 ENCOUNTER — Telehealth: Payer: Self-pay | Admitting: Internal Medicine

## 2013-09-17 NOTE — Telephone Encounter (Signed)
Relevant patient education mailed to patient.  

## 2013-09-17 NOTE — Telephone Encounter (Signed)
Pt returing your call. pls call pt after 3pm tomorrow

## 2013-09-18 NOTE — Telephone Encounter (Signed)
Pt aware see result note 

## 2013-12-16 ENCOUNTER — Ambulatory Visit (INDEPENDENT_AMBULATORY_CARE_PROVIDER_SITE_OTHER): Payer: Managed Care, Other (non HMO) | Admitting: Internal Medicine

## 2013-12-16 ENCOUNTER — Encounter: Payer: Self-pay | Admitting: Internal Medicine

## 2013-12-16 VITALS — BP 130/80 | HR 89 | Temp 97.8°F | Resp 20 | Ht 68.0 in | Wt 184.0 lb

## 2013-12-16 DIAGNOSIS — I10 Essential (primary) hypertension: Secondary | ICD-10-CM

## 2013-12-16 DIAGNOSIS — E119 Type 2 diabetes mellitus without complications: Secondary | ICD-10-CM

## 2013-12-16 DIAGNOSIS — E785 Hyperlipidemia, unspecified: Secondary | ICD-10-CM

## 2013-12-16 LAB — HEMOGLOBIN A1C: HEMOGLOBIN A1C: 7.6 % — AB (ref 4.6–6.5)

## 2013-12-16 NOTE — Progress Notes (Signed)
Subjective:    Patient ID: Matthew Prince, male    DOB: 31-Mar-1956, 58 y.o.   MRN: [id number]  HPI  Wt Readings from Last 3 Encounters:  12/16/13 184 lb (83.462 kg)  09/16/13 187 lb (84.823 kg)  06/06/13 183 lb (83.8 kg)    58 year old patient who is seen today for his quarterly followup.  He has a history of type 2 diabetes.  Last hemoglobin A1c was a bit elevated at 7 point 2.  He has coronary artery disease, dyslipidemia, and treated hypertension.  His cardiac status has been stable.  Past Medical History  Diagnosis Date  . Hyperlipidemia   . Allergy   . Diabetes mellitus   . CAD S/P percutaneous coronary angioplasty     PCI to LAD, the DES  . HISTORY OF: ST elevation myocardial infarction (STEMI) of inferior wall 11/29/2012     99% LAD occlusion - PCI with DES  . Cardiomyopathy, ischemic - EF ~40-45% with distal Anterior - Antero& InferoApical hypokinesis. 11/29/2012  . Presence of drug coated stent in LAD coronary artery 11/29/2012    Xience eXpedition DES 3.0 mm x 18 mm - (3.4 mm)  . BPV (benign positional vertigo) 10/2009  . Low back pain   . Hypertension, essential     History   Social History  . Marital Status: Single    Spouse Name: N/A    Number of Children: N/A  . Years of Education: N/A   Occupational History  . Not on file.   Social History Main Topics  . Smoking status: Never Smoker   . Smokeless tobacco: Never Used  . Alcohol Use: No  . Drug Use: Not on file  . Sexual Activity: Not on file   Other Topics Concern  . Not on file   Social History Narrative   He is married, lives in [place].  He works for [employer].   He is now back very active, walking several miles a day.  He is anxious to go back to work and to start cardiac rehabilitation.  He does not drink and does not smoke.    Past Surgical History  Procedure Laterality Date  . Neck surgery      resection benign tumor  . Coronary angioplasty with stent placement   11/29/2012    99% thrombotic LAD occlusion - Xience Expedition 3.0 mm x 18 mm - post-dilated to 3.4 mm  . Transthoracic echocardiogram  03/12/2013    Post MI: Low normal EF (50-55%), apical false tendon.  Mild HK of apical anterior and apical lateral wall.  Grade 1 diastolic dysfunction.    Family History  Problem Relation Age of Onset  . Healthy      Noncontributory; he is not very aware of his family's history.    Allergies  Allergen Reactions  . Iodine     REACTION: unspecified    Current Outpatient Prescriptions on File Prior to Visit  Medication Sig Dispense Refill  . aspirin 81 MG chewable tablet Chew 1 tablet (81 mg total) by mouth daily.      Marland Kitchen atorvastatin (LIPITOR) 80 MG tablet Take 1 tablet (80 mg total) by mouth daily.  90 tablet  1  . carvedilol (COREG) 12.5 MG tablet TAKE 1 TABLET BY MOUTH TWO TIMES A DAY WITH A MEAL  180 tablet  1  . glucose blood test strip Check glucose level before each meal  ONE TOUCH VERIO TEST STRIP  100 each  12  .  hydrocortisone (ANUSOL-HC) 2.5 % rectal cream Place 1 application rectally 2 (two) times daily. For 7 days. Starred 05/02/13      . isosorbide mononitrate (IMDUR) 30 MG 24 hr tablet Take 1 tablet (30 mg total) by mouth daily.  90 tablet  2  . lisinopril (PRINIVIL,ZESTRIL) 2.5 MG tablet Take 1 tablet (2.5 mg total) by mouth daily.  90 tablet  2  . metFORMIN (GLUCOPHAGE) 1000 MG tablet TAKE 1 TABLET BY MOUTH 2 TIMES DAILY WITH MEALS  180 tablet  1  . nitroGLYCERIN (NITROSTAT) 0.4 MG SL tablet Place 1 tablet (0.4 mg total) under the tongue every 5 (five) minutes as needed for chest pain.  25 tablet  2  . saxagliptin HCl (ONGLYZA) 5 MG TABS tablet Take 1 tablet (5 mg total) by mouth daily.  90 tablet  1  . Ticagrelor (BRILINTA) 90 MG TABS tablet Take 1 tablet (90 mg total) by mouth 2 (two) times daily.  32 tablet  0   No current facility-administered medications on file prior to visit.    BP 130/80  Pulse 89  Temp(Src) 97.8 F (36.6  C) (Oral)  Resp 20  Ht 5\' 8"  (1.727 m)  Wt 184 lb (83.462 kg)  BMI 27.98 kg/m2  SpO2 97%     Review of Systems  Constitutional: Negative for fever, chills, appetite change and fatigue.  HENT: Negative for congestion, dental problem, ear pain, hearing loss, sore throat, tinnitus, trouble swallowing and voice change.   Eyes: Negative for pain, discharge and visual disturbance.  Respiratory: Negative for cough, chest tightness, wheezing and stridor.   Cardiovascular: Negative for chest pain, palpitations and leg swelling.  Gastrointestinal: Negative for nausea, vomiting, abdominal pain, diarrhea, constipation, blood in stool and abdominal distention.  Genitourinary: Negative for urgency, hematuria, flank pain, discharge, difficulty urinating and genital sores.  Musculoskeletal: Negative for arthralgias, back pain, gait problem, joint swelling, myalgias and neck stiffness.  Skin: Negative for rash.  Neurological: Negative for dizziness, syncope, speech difficulty, weakness, numbness and headaches.  Hematological: Negative for adenopathy. Does not bruise/bleed easily.  Psychiatric/Behavioral: Negative for behavioral problems and dysphoric mood. The patient is not nervous/anxious.        Objective:   Physical Exam  Constitutional: He is oriented to person, place, and time. He appears well-developed.  HENT:  Head: Normocephalic.  Right Ear: External ear normal.  Left Ear: External ear normal.  Eyes: Conjunctivae and EOM are normal.  Neck: Normal range of motion.  Cardiovascular: Normal rate and normal heart sounds.   Pulmonary/Chest: Breath sounds normal.  Abdominal: Bowel sounds are normal.  Musculoskeletal: Normal range of motion. He exhibits no edema and no tenderness.  Neurological: He is alert and oriented to person, place, and time.  Psychiatric: He has a normal mood and affect. His behavior is normal.          Assessment & Plan:   Diabetes mellitus.  We'll check a  hemoglobin A1c Hypertension well controlled  Dyslipidemia.  LDL cholesterol less than 70   Recheck 3 months Cardiology followup in 2 months as scheduled

## 2013-12-16 NOTE — Progress Notes (Signed)
Pre visit review using our clinic review tool, if applicable. No additional management support is needed unless otherwise documented below in the visit note. 

## 2013-12-16 NOTE — Patient Instructions (Signed)
Limit your sodium (Salt) intake   Please check your hemoglobin A1c every 3 months    It is important that you exercise regularly, at least 20 minutes 3 to 4 times per week.  If you develop chest pain or shortness of breath seek  medical attention.   

## 2013-12-17 ENCOUNTER — Other Ambulatory Visit: Payer: Self-pay | Admitting: *Deleted

## 2013-12-17 MED ORDER — CANAGLIFLOZIN 100 MG PO TABS: 100.0000 mg | ORAL_TABLET | Freq: Every day | ORAL | Status: DC

## 2014-01-01 ENCOUNTER — Ambulatory Visit (INDEPENDENT_AMBULATORY_CARE_PROVIDER_SITE_OTHER): Payer: Managed Care, Other (non HMO) | Admitting: Internal Medicine

## 2014-01-01 ENCOUNTER — Encounter: Payer: Self-pay | Admitting: Internal Medicine

## 2014-01-01 VITALS — BP 140/80 | HR 89 | Temp 98.5°F | Resp 20 | Ht 68.0 in | Wt 178.0 lb

## 2014-01-01 DIAGNOSIS — J069 Acute upper respiratory infection, unspecified: Secondary | ICD-10-CM

## 2014-01-01 DIAGNOSIS — I10 Essential (primary) hypertension: Secondary | ICD-10-CM

## 2014-01-01 DIAGNOSIS — B9789 Other viral agents as the cause of diseases classified elsewhere: Secondary | ICD-10-CM

## 2014-01-01 MED ORDER — HYDROCODONE-HOMATROPINE 5-1.5 MG/5ML PO SYRP: 5.0000 mL | ORAL_SOLUTION | Freq: Four times a day (QID) | ORAL | Status: AC | PRN

## 2014-01-01 MED ORDER — TRAMADOL HCL 50 MG PO TABS: 50.0000 mg | ORAL_TABLET | Freq: Three times a day (TID) | ORAL | Status: DC | PRN

## 2014-01-01 NOTE — Progress Notes (Signed)
Pre visit review using our clinic review tool, if applicable. No additional management support is needed unless otherwise documented below in the visit note. 

## 2014-01-01 NOTE — Patient Instructions (Signed)
Acute bronchitis symptoms for less than 10 days are generally not helped by antibiotics.  Take over-the-counter expectorants and cough medications such as  Mucinex DM.  Call if there is no improvement in 5 to 7 days or if  you develop worsening cough, fever, or new symptoms, such as shortness of breath or chest pain.   

## 2014-01-01 NOTE — Progress Notes (Signed)
Subjective:    Patient ID: Matthew Prince, male    DOB: 15-Feb-1956, 58 y.o.   MRN: [id number]  HPI 58 year old patient who has a history of CAD, hypertension, and diabetes.  He presents today with a five-day history of cough, congestion, sore throat, and headache.  There's been no documented fever.  His girlfriend has a similar illness and has been ill for 2 weeks.  Lab Results  Component Value Date   HGBA1C 7.6* 12/16/2013    Past Medical History  Diagnosis Date  . Hyperlipidemia   . Allergy   . Diabetes mellitus   . CAD S/P percutaneous coronary angioplasty     PCI to LAD, the DES  . HISTORY OF: ST elevation myocardial infarction (STEMI) of inferior wall 11/29/2012     99% LAD occlusion - PCI with DES  . Cardiomyopathy, ischemic - EF ~40-45% with distal Anterior - Antero& InferoApical hypokinesis. 11/29/2012  . Presence of drug coated stent in LAD coronary artery 11/29/2012    Xience eXpedition DES 3.0 mm x 18 mm - (3.4 mm)  . BPV (benign positional vertigo) 10/2009  . Low back pain   . Hypertension, essential     History   Social History  . Marital Status: Single    Spouse Name: N/A    Number of Children: N/A  . Years of Education: N/A   Occupational History  . Not on file.   Social History Main Topics  . Smoking status: Never Smoker   . Smokeless tobacco: Never Used  . Alcohol Use: No  . Drug Use: Not on file  . Sexual Activity: Not on file   Other Topics Concern  . Not on file   Social History Narrative   He is married, lives in [place].  He works for [employer].   He is now back very active, walking several miles a day.  He is anxious to go back to work and to start cardiac rehabilitation.  He does not drink and does not smoke.    Past Surgical History  Procedure Laterality Date  . Neck surgery      resection benign tumor  . Coronary angioplasty with stent placement  11/29/2012    99% thrombotic LAD occlusion - Xience Expedition 3.0 mm x 18  mm - post-dilated to 3.4 mm  . Transthoracic echocardiogram  03/12/2013    Post MI: Low normal EF (50-55%), apical false tendon.  Mild HK of apical anterior and apical lateral wall.  Grade 1 diastolic dysfunction.    Family History  Problem Relation Age of Onset  . Healthy      Noncontributory; he is not very aware of his family's history.    Allergies  Allergen Reactions  . Iodine     REACTION: unspecified    Current Outpatient Prescriptions on File Prior to Visit  Medication Sig Dispense Refill  . aspirin 81 MG chewable tablet Chew 1 tablet (81 mg total) by mouth daily.      Marland Kitchen atorvastatin (LIPITOR) 80 MG tablet Take 1 tablet (80 mg total) by mouth daily.  90 tablet  1  . Canagliflozin 100 MG TABS Take 1 tablet (100 mg total) by mouth daily.  30 tablet  12  . carvedilol (COREG) 12.5 MG tablet TAKE 1 TABLET BY MOUTH TWO TIMES A DAY WITH A MEAL  180 tablet  1  . glucose blood test strip Check glucose level before each meal  ONE TOUCH VERIO TEST STRIP  100 each  12  . hydrocortisone (ANUSOL-HC) 2.5 % rectal cream Place 1 application rectally 2 (two) times daily. For 7 days. Starred 05/02/13      . isosorbide mononitrate (IMDUR) 30 MG 24 hr tablet Take 1 tablet (30 mg total) by mouth daily.  90 tablet  2  . lisinopril (PRINIVIL,ZESTRIL) 2.5 MG tablet Take 1 tablet (2.5 mg total) by mouth daily.  90 tablet  2  . metFORMIN (GLUCOPHAGE) 1000 MG tablet TAKE 1 TABLET BY MOUTH 2 TIMES DAILY WITH MEALS  180 tablet  1  . nitroGLYCERIN (NITROSTAT) 0.4 MG SL tablet Place 1 tablet (0.4 mg total) under the tongue every 5 (five) minutes as needed for chest pain.  25 tablet  2  . saxagliptin HCl (ONGLYZA) 5 MG TABS tablet Take 1 tablet (5 mg total) by mouth daily.  90 tablet  1  . Ticagrelor (BRILINTA) 90 MG TABS tablet Take 1 tablet (90 mg total) by mouth 2 (two) times daily.  32 tablet  0   No current facility-administered medications on file prior to visit.    BP 140/80  Pulse 89  Temp(Src)  98.5 F (36.9 C) (Oral)  Resp 20  Ht 5\' 8"  (1.727 m)  Wt 178 lb (80.74 kg)  BMI 27.07 kg/m2  SpO2 97%      Review of Systems  Constitutional: Positive for activity change, appetite change and fatigue. Negative for fever and chills.  HENT: Positive for congestion and sore throat. Negative for dental problem, ear pain, hearing loss, tinnitus, trouble swallowing and voice change.   Eyes: Negative for pain, discharge and visual disturbance.  Respiratory: Positive for cough. Negative for chest tightness, wheezing and stridor.   Cardiovascular: Negative for chest pain, palpitations and leg swelling.  Gastrointestinal: Negative for nausea, vomiting, abdominal pain, diarrhea, constipation, blood in stool and abdominal distention.  Genitourinary: Negative for urgency, hematuria, flank pain, discharge, difficulty urinating and genital sores.  Musculoskeletal: Negative for arthralgias, back pain, gait problem, joint swelling, myalgias and neck stiffness.  Skin: Negative for rash.  Neurological: Positive for headaches. Negative for dizziness, syncope, speech difficulty, weakness and numbness.  Hematological: Negative for adenopathy. Does not bruise/bleed easily.  Psychiatric/Behavioral: Negative for behavioral problems and dysphoric mood. The patient is not nervous/anxious.        Objective:   Physical Exam  Constitutional: He is oriented to person, place, and time. He appears well-developed and well-nourished. No distress.  Afebrile Blood pressure 128/78  HENT:  Head: Normocephalic.  Right Ear: External ear normal.  Left Ear: External ear normal.  Oropharynx erythematous without exudate  Eyes: Conjunctivae and EOM are normal.  Neck: Normal range of motion.  Cardiovascular: Normal rate, regular rhythm and normal heart sounds.   No tachycardia  Pulmonary/Chest: Breath sounds normal. No respiratory distress. He has no rales.  Abdominal: Bowel sounds are normal.  Musculoskeletal: Normal  range of motion. He exhibits no edema and no tenderness.  Lymphadenopathy:    He has no cervical adenopathy.  Neurological: He is alert and oriented to person, place, and time.  Skin: No rash noted.  Psychiatric: He has a normal mood and affect. His behavior is normal.          Assessment & Plan:  Viral URI with cough and pharyngitis.  We'll treat symptomatically Hypertension CAD Diabetes mellitus.  Suboptimal control.  We'll reassess in 2 months.  May need change of therapy at that time

## 2014-01-02 ENCOUNTER — Telehealth: Payer: Self-pay | Admitting: Internal Medicine

## 2014-01-02 ENCOUNTER — Encounter: Payer: Self-pay | Admitting: Internal Medicine

## 2014-01-02 ENCOUNTER — Ambulatory Visit (INDEPENDENT_AMBULATORY_CARE_PROVIDER_SITE_OTHER): Payer: Managed Care, Other (non HMO) | Admitting: Internal Medicine

## 2014-01-02 VITALS — BP 126/80 | HR 90 | Temp 98.2°F | Resp 20 | Ht 68.0 in | Wt 176.0 lb

## 2014-01-02 DIAGNOSIS — I10 Essential (primary) hypertension: Secondary | ICD-10-CM

## 2014-01-02 DIAGNOSIS — T7840XA Allergy, unspecified, initial encounter: Secondary | ICD-10-CM

## 2014-01-02 DIAGNOSIS — T50995A Adverse effect of other drugs, medicaments and biological substances, initial encounter: Secondary | ICD-10-CM

## 2014-01-02 MED ORDER — METHYLPREDNISOLONE ACETATE 80 MG/ML IJ SUSP
80.0000 mg | Freq: Once | INTRAMUSCULAR | Status: AC
  Administered 2014-01-02: 80 mg via INTRAMUSCULAR

## 2014-01-02 NOTE — Telephone Encounter (Signed)
Error/gd °

## 2014-01-02 NOTE — Progress Notes (Signed)
Pre visit review using our clinic review tool, if applicable. No additional management support is needed unless otherwise documented below in the visit note. 

## 2014-01-02 NOTE — Progress Notes (Signed)
Subjective:    Patient ID: Matthew Prince, male    DOB: 06-29-1955, 58 y.o.   MRN: [id number]  HPI 58 year old patient who was seen yesterday and treated symptomatically for a URI.  He was placed on Hydromet as well as tramadol and took 2 dosages yesterday.  On awakening this morning, he had some mild left lower lip edema and he took a third dose of both products.  Throughout the day.  He has had progressive swelling of both the upper and lower lips.  He denies any tongue swelling, shortness or breath, wheezing, or other complaints.  Medical regimen also includes lisinopril Past Medical History  Diagnosis Date  . Hyperlipidemia   . Allergy   . Diabetes mellitus   . CAD S/P percutaneous coronary angioplasty     PCI to LAD, the DES  . HISTORY OF: ST elevation myocardial infarction (STEMI) of inferior wall 11/29/2012     99% LAD occlusion - PCI with DES  . Cardiomyopathy, ischemic - EF ~40-45% with distal Anterior - Antero& InferoApical hypokinesis. 11/29/2012  . Presence of drug coated stent in LAD coronary artery 11/29/2012    Xience eXpedition DES 3.0 mm x 18 mm - (3.4 mm)  . BPV (benign positional vertigo) 10/2009  . Low back pain   . Hypertension, essential     History   Social History  . Marital Status: Single    Spouse Name: N/A    Number of Children: N/A  . Years of Education: N/A   Occupational History  . Not on file.   Social History Main Topics  . Smoking status: Never Smoker   . Smokeless tobacco: Never Used  . Alcohol Use: No  . Drug Use: Not on file  . Sexual Activity: Not on file   Other Topics Concern  . Not on file   Social History Narrative   He is married, lives in [place].  He works for [employer].   He is now back very active, walking several miles a day.  He is anxious to go back to work and to start cardiac rehabilitation.  He does not drink and does not smoke.    Past Surgical History  Procedure Laterality Date  . Neck surgery      resection benign tumor  . Coronary angioplasty with stent placement  11/29/2012    99% thrombotic LAD occlusion - Xience Expedition 3.0 mm x 18 mm - post-dilated to 3.4 mm  . Transthoracic echocardiogram  03/12/2013    Post MI: Low normal EF (50-55%), apical false tendon.  Mild HK of apical anterior and apical lateral wall.  Grade 1 diastolic dysfunction.    Family History  Problem Relation Age of Onset  . Healthy      Noncontributory; he is not very aware of his family's history.    Allergies  Allergen Reactions  . Hydromet [Hydrocodone-Homatropine]     Lip swelling with concomitant use with tramadol  . Iodine     REACTION: unspecified  . Ultram [Tramadol]     Lip swelling with concomitant use of Hytromet    Current Outpatient Prescriptions on File Prior to Visit  Medication Sig Dispense Refill  . aspirin 81 MG chewable tablet Chew 1 tablet (81 mg total) by mouth daily.      Marland Kitchen atorvastatin (LIPITOR) 80 MG tablet Take 1 tablet (80 mg total) by mouth daily.  90 tablet  1  . Canagliflozin 100 MG TABS Take 1 tablet (100 mg total)  by mouth daily.  30 tablet  12  . carvedilol (COREG) 12.5 MG tablet TAKE 1 TABLET BY MOUTH TWO TIMES A DAY WITH A MEAL  180 tablet  1  . glucose blood test strip Check glucose level before each meal  ONE TOUCH VERIO TEST STRIP  100 each  12  . HYDROcodone-homatropine (HYCODAN) 5-1.5 MG/5ML syrup Take 5 mLs by mouth every 6 (six) hours as needed.  120 mL  0  . hydrocortisone (ANUSOL-HC) 2.5 % rectal cream Place 1 application rectally 2 (two) times daily. For 7 days. Starred 05/02/13      . isosorbide mononitrate (IMDUR) 30 MG 24 hr tablet Take 1 tablet (30 mg total) by mouth daily.  90 tablet  2  . lisinopril (PRINIVIL,ZESTRIL) 2.5 MG tablet Take 1 tablet (2.5 mg total) by mouth daily.  90 tablet  2  . metFORMIN (GLUCOPHAGE) 1000 MG tablet TAKE 1 TABLET BY MOUTH 2 TIMES DAILY WITH MEALS  180 tablet  1  . nitroGLYCERIN (NITROSTAT) 0.4 MG SL tablet Place 1  tablet (0.4 mg total) under the tongue every 5 (five) minutes as needed for chest pain.  25 tablet  2  . saxagliptin HCl (ONGLYZA) 5 MG TABS tablet Take 1 tablet (5 mg total) by mouth daily.  90 tablet  1  . Ticagrelor (BRILINTA) 90 MG TABS tablet Take 1 tablet (90 mg total) by mouth 2 (two) times daily.  32 tablet  0  . traMADol (ULTRAM) 50 MG tablet Take 1 tablet (50 mg total) by mouth every 8 (eight) hours as needed.  30 tablet  0   No current facility-administered medications on file prior to visit.    BP 126/80  Pulse 90  Temp(Src) 98.2 F (36.8 C) (Oral)  Resp 20  Ht 5\' 8"  (1.727 m)  Wt 176 lb (79.833 kg)  BMI 26.77 kg/m2  SpO2 96%     Review of Systems  Constitutional: Negative for fever, chills, appetite change and fatigue.  HENT: Negative for congestion, dental problem, ear pain, hearing loss, sore throat, tinnitus, trouble swallowing and voice change.        Upper and lower lip swelling  Eyes: Negative for pain, discharge and visual disturbance.  Respiratory: Negative for cough, chest tightness, wheezing and stridor.   Cardiovascular: Negative for chest pain, palpitations and leg swelling.  Gastrointestinal: Negative for nausea, vomiting, abdominal pain, diarrhea, constipation, blood in stool and abdominal distention.  Genitourinary: Negative for urgency, hematuria, flank pain, discharge, difficulty urinating and genital sores.  Musculoskeletal: Negative for arthralgias, back pain, gait problem, joint swelling, myalgias and neck stiffness.  Skin: Negative for rash.  Neurological: Negative for dizziness, syncope, speech difficulty, weakness, numbness and headaches.  Hematological: Negative for adenopathy. Does not bruise/bleed easily.  Psychiatric/Behavioral: Negative for behavioral problems and dysphoric mood. The patient is not nervous/anxious.        Objective:   Physical Exam  Constitutional: He is oriented to person, place, and time. He appears well-developed. No  distress.  HENT:  Head: Normocephalic.  Right Ear: External ear normal.  Left Ear: External ear normal.  Prominent swelling of both upper and lower lips Tongue and oropharynx normal  Eyes: Conjunctivae and EOM are normal.  Neck: Normal range of motion.  Cardiovascular: Normal rate and normal heart sounds.   Pulse slow and regular  Pulmonary/Chest: Breath sounds normal.  Abdominal: Bowel sounds are normal.  Musculoskeletal: Normal range of motion. He exhibits no edema and no tenderness.  Neurological: He is  alert and oriented to person, place, and time.  Psychiatric: He has a normal mood and affect. His behavior is normal.          Assessment & Plan:   Adverse drug reaction.  Will mark his chart allergic to both tramadol and hydrocodone.  We'll treat with Depo-Medrol 80 as well as an antihistamine.  He will call if any clinical worsening Resolving URI

## 2014-01-02 NOTE — Patient Instructions (Signed)
Use a nonsedating antihistamine, such as Allegra or Claritin daily.  Take an additional 2 days after lip swelling has resolved  Hold lisinopril for 2 weeks  Call or return to clinic prn if these symptoms worsen or fail to improve as anticipated.

## 2014-01-02 NOTE — Telephone Encounter (Signed)
Relevant patient education assigned to patient using Emmi. ° °

## 2014-01-02 NOTE — Addendum Note (Signed)
Addended by: Marian Sorrow on: 01/02/2014 04:13 PM   Modules accepted: Orders

## 2014-01-28 ENCOUNTER — Other Ambulatory Visit: Payer: Self-pay | Admitting: Internal Medicine

## 2014-03-12 ENCOUNTER — Telehealth: Payer: Self-pay | Admitting: Cardiology

## 2014-03-13 NOTE — Telephone Encounter (Signed)
Close encounter 

## 2014-03-19 ENCOUNTER — Encounter: Payer: Self-pay | Admitting: Internal Medicine

## 2014-03-19 ENCOUNTER — Ambulatory Visit (INDEPENDENT_AMBULATORY_CARE_PROVIDER_SITE_OTHER): Payer: Managed Care, Other (non HMO) | Admitting: Internal Medicine

## 2014-03-19 ENCOUNTER — Ambulatory Visit: Payer: Managed Care, Other (non HMO) | Admitting: Cardiology

## 2014-03-19 VITALS — BP 120/70 | HR 91 | Temp 98.5°F | Resp 20 | Ht 68.0 in | Wt 179.0 lb

## 2014-03-19 DIAGNOSIS — I10 Essential (primary) hypertension: Secondary | ICD-10-CM

## 2014-03-19 DIAGNOSIS — E785 Hyperlipidemia, unspecified: Secondary | ICD-10-CM

## 2014-03-19 DIAGNOSIS — I255 Ischemic cardiomyopathy: Secondary | ICD-10-CM

## 2014-03-19 DIAGNOSIS — E119 Type 2 diabetes mellitus without complications: Secondary | ICD-10-CM

## 2014-03-19 MED ORDER — CANAGLIFLOZIN 300 MG PO TABS: 300.0000 mg | ORAL_TABLET | Freq: Every day | ORAL | Status: DC

## 2014-03-19 NOTE — Patient Instructions (Signed)
Please check your hemoglobin A1c every 3 months  Limit your sodium (Salt) intake    It is important that you exercise regularly, at least 20 minutes 3 to 4 times per week.  If you develop chest pain or shortness of breath seek  medical attention.   

## 2014-03-19 NOTE — Progress Notes (Signed)
Pre visit review using our clinic review tool, if applicable. No additional management support is needed unless otherwise documented below in the visit note. 

## 2014-03-20 ENCOUNTER — Encounter: Payer: Self-pay | Admitting: Internal Medicine

## 2014-03-20 LAB — HEMOGLOBIN A1C: Hgb A1c MFr Bld: 6.6 % — ABNORMAL HIGH (ref 4.6–6.5)

## 2014-03-20 NOTE — Progress Notes (Signed)
Subjective:    Patient ID: Matthew Prince, male    DOB: 01-14-1956, 58 y.o.   MRN: [id number]  HPI  58 year old patient who is seen today for followup of type 2 diabetes.  Medical problems include coronary artery disease with ischemic cardiopathy, hypertension, and dyslipidemia.  Last visit, he was started on Invokanna, and he has noted much improved.  Glycemic control.  He feels well today.  No concerns or complaints.  Past Medical History  Diagnosis Date  . Hyperlipidemia   . Allergy   . Diabetes mellitus   . CAD S/P percutaneous coronary angioplasty     PCI to LAD, the DES  . HISTORY OF: ST elevation myocardial infarction (STEMI) of inferior wall 11/29/2012     99% LAD occlusion - PCI with DES  . Cardiomyopathy, ischemic - EF ~40-45% with distal Anterior - Antero& InferoApical hypokinesis. 11/29/2012  . Presence of drug coated stent in LAD coronary artery 11/29/2012    Xience eXpedition DES 3.0 mm x 18 mm - (3.4 mm)  . BPV (benign positional vertigo) 10/2009  . Low back pain   . Hypertension, essential     History   Social History  . Marital Status: Single    Spouse Name: N/A    Number of Children: N/A  . Years of Education: N/A   Occupational History  . Not on file.   Social History Main Topics  . Smoking status: Never Smoker   . Smokeless tobacco: Never Used  . Alcohol Use: No  . Drug Use: Not on file  . Sexual Activity: Not on file   Other Topics Concern  . Not on file   Social History Narrative   He is married, lives in [place].  He works for [employer].   He is now back very active, walking several miles a day.  He is anxious to go back to work and to start cardiac rehabilitation.  He does not drink and does not smoke.    Past Surgical History  Procedure Laterality Date  . Neck surgery      resection benign tumor  . Coronary angioplasty with stent placement  11/29/2012    99% thrombotic LAD occlusion - Xience Expedition 3.0 mm x 18 mm -  post-dilated to 3.4 mm  . Transthoracic echocardiogram  03/12/2013    Post MI: Low normal EF (50-55%), apical false tendon.  Mild HK of apical anterior and apical lateral wall.  Grade 1 diastolic dysfunction.    Family History  Problem Relation Age of Onset  . Healthy      Noncontributory; he is not very aware of his family's history.    Allergies  Allergen Reactions  . Hydromet [Hydrocodone-Homatropine]     Lip swelling with concomitant use with tramadol  . Iodine     REACTION: unspecified  . Ultram [Tramadol]     Lip swelling with concomitant use of Hytromet    Current Outpatient Prescriptions on File Prior to Visit  Medication Sig Dispense Refill  . aspirin 81 MG chewable tablet Chew 1 tablet (81 mg total) by mouth daily.      Marland Kitchen atorvastatin (LIPITOR) 80 MG tablet TAKE 1 TABLET (80MG  TOTAL) BY MOUTH DAILY  90 tablet  0  . carvedilol (COREG) 12.5 MG tablet TAKE 1 TABLET BY MOUTH TWICE DAILY WITH MEALS  180 tablet  0  . glucose blood test strip Check glucose level before each meal  ONE TOUCH VERIO TEST STRIP  100 each  12  . hydrocortisone (ANUSOL-HC) 2.5 % rectal cream Place 1 application rectally 2 (two) times daily. For 7 days. Starred 05/02/13      . isosorbide mononitrate (IMDUR) 30 MG 24 hr tablet Take 1 tablet (30 mg total) by mouth daily.  90 tablet  2  . lisinopril (PRINIVIL,ZESTRIL) 2.5 MG tablet Take 1 tablet (2.5 mg total) by mouth daily.  90 tablet  2  . metFORMIN (GLUCOPHAGE) 1000 MG tablet TAKE 1 TABLET BY MOUTH TWICE DAILY WITH MEALS  180 tablet  0  . nitroGLYCERIN (NITROSTAT) 0.4 MG SL tablet Place 1 tablet (0.4 mg total) under the tongue every 5 (five) minutes as needed for chest pain.  25 tablet  2  . saxagliptin HCl (ONGLYZA) 5 MG TABS tablet Take 1 tablet (5 mg total) by mouth daily.  90 tablet  1  . Ticagrelor (BRILINTA) 90 MG TABS tablet Take 1 tablet (90 mg total) by mouth 2 (two) times daily.  32 tablet  0   No current facility-administered medications  on file prior to visit.    BP 120/70  Pulse 91  Temp(Src) 98.5 F (36.9 C) (Oral)  Resp 20  Ht 5\' 8"  (1.727 m)  Wt 179 lb (81.194 kg)  BMI 27.22 kg/m2  SpO2 96%    Review of Systems  Constitutional: Negative for fever, chills, appetite change and fatigue.  HENT: Negative for congestion, dental problem, ear pain, hearing loss, sore throat, tinnitus, trouble swallowing and voice change.   Eyes: Negative for pain, discharge and visual disturbance.  Respiratory: Negative for cough, chest tightness, wheezing and stridor.   Cardiovascular: Negative for chest pain, palpitations and leg swelling.  Gastrointestinal: Negative for nausea, vomiting, abdominal pain, diarrhea, constipation, blood in stool and abdominal distention.  Genitourinary: Negative for urgency, hematuria, flank pain, discharge, difficulty urinating and genital sores.  Musculoskeletal: Negative for arthralgias, back pain, gait problem, joint swelling, myalgias and neck stiffness.  Skin: Negative for rash.  Neurological: Negative for dizziness, syncope, speech difficulty, weakness, numbness and headaches.  Hematological: Negative for adenopathy. Does not bruise/bleed easily.  Psychiatric/Behavioral: Negative for behavioral problems and dysphoric mood. The patient is not nervous/anxious.        Objective:   Physical Exam  Constitutional: He is oriented to person, place, and time. He appears well-developed.  HENT:  Head: Normocephalic.  Right Ear: External ear normal.  Left Ear: External ear normal.  Eyes: Conjunctivae and EOM are normal.  Neck: Normal range of motion.  Cardiovascular: Normal rate and normal heart sounds.   Pulmonary/Chest: Breath sounds normal.  Abdominal: Bowel sounds are normal.  Musculoskeletal: Normal range of motion. He exhibits no edema and no tenderness.  Neurological: He is alert and oriented to person, place, and time.  Psychiatric: He has a normal mood and affect. His behavior is normal.            Assessment & Plan:   Diabetes mellitus.  Appears to be improved.  We'll check a hemoglobin A1c Hypertension well controlled Dyslipidemia.  Continue statin therapy  We'll increase Invokanna to 300 mg daily Recheck 3 months

## 2014-04-27 ENCOUNTER — Ambulatory Visit (INDEPENDENT_AMBULATORY_CARE_PROVIDER_SITE_OTHER): Payer: Managed Care, Other (non HMO) | Admitting: Cardiology

## 2014-04-27 ENCOUNTER — Encounter: Payer: Self-pay | Admitting: Cardiology

## 2014-04-27 VITALS — BP 140/84 | HR 83 | Ht 68.0 in | Wt 175.6 lb

## 2014-04-27 DIAGNOSIS — Z79899 Other long term (current) drug therapy: Secondary | ICD-10-CM

## 2014-04-27 DIAGNOSIS — I255 Ischemic cardiomyopathy: Secondary | ICD-10-CM

## 2014-04-27 DIAGNOSIS — I2102 ST elevation (STEMI) myocardial infarction involving left anterior descending coronary artery: Secondary | ICD-10-CM

## 2014-04-27 DIAGNOSIS — I10 Essential (primary) hypertension: Secondary | ICD-10-CM

## 2014-04-27 DIAGNOSIS — E785 Hyperlipidemia, unspecified: Secondary | ICD-10-CM

## 2014-04-27 DIAGNOSIS — E1169 Type 2 diabetes mellitus with other specified complication: Secondary | ICD-10-CM

## 2014-04-27 DIAGNOSIS — Z9861 Coronary angioplasty status: Secondary | ICD-10-CM

## 2014-04-27 DIAGNOSIS — I2510 Atherosclerotic heart disease of native coronary artery without angina pectoris: Secondary | ICD-10-CM

## 2014-04-27 MED ORDER — TICAGRELOR 90 MG PO TABS: 90.0000 mg | ORAL_TABLET | Freq: Two times a day (BID) | ORAL | Status: DC

## 2014-04-27 MED ORDER — CLOPIDOGREL BISULFATE 75 MG PO TABS: 75.0000 mg | ORAL_TABLET | Freq: Every day | ORAL | Status: DC

## 2014-04-27 NOTE — Patient Instructions (Signed)
COMPLETE TAKING BRILINTA  UNTIL PLAVIX (CLOPIDOGREL) FROM MAIL ORDER.  IN 3 WEEKS AFTER STARTING CLOPIDOGREL - LABS DRAWN AT [facility].- GO TO MAIN ENTRANCE - REGISTRATION.  Your physician wants you to follow-up in [place].  You will receive a reminder letter in the mail two months in advance. If you don't receive a letter, please call our office to schedule the follow-up appointment.

## 2014-04-28 ENCOUNTER — Encounter: Payer: Self-pay | Admitting: Cardiology

## 2014-04-28 NOTE — Assessment & Plan Note (Signed)
He seemed to fully recover from his MI. 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 He is now determined to avoid further complication.  He is exercising like he was recommended a cardiac rehabilitation. He is working closely with his PCP to improve his lipid and diabetes control. No recurrent anginal symptoms. On stable regimen.

## 2014-04-28 NOTE — Progress Notes (Signed)
PCP: [name], MD  Clinic Note: Chief Complaint  Patient presents with  . OTHER    pt denies chest pain, swelling and sob  . Coronary Artery Disease    Anterior STEMI 11/2012, PCI LAD    HPI: Matthew Prince is a 58 y.o. male with a PMH below who presents today for delayed one year followup of his CAD status post  PCI to LAD in the setting of an Anterior STEMI.  Past Medical History  Diagnosis Date  . Hyperlipidemia   . Allergy   . Diabetes mellitus   . CAD S/P percutaneous coronary angioplasty     PCI to LAD, the DES  . HISTORY OF: ST elevation myocardial infarction (STEMI) of inferior wall 11/29/2012     99% LAD occlusion - PCI with DES  . Cardiomyopathy, ischemic - EF ~40-45% with distal Anterior - Antero& InferoApical hypokinesis. 11/29/2012    F/U Echo 02/2013: EF 50-55%. Mild anterior apical and apical lateral HK, Gr 1 DD  . Presence of drug coated stent in LAD coronary artery 11/29/2012    Xience eXpedition DES 3.0 mm x 18 mm - (3.4 mm)  . BPV (benign positional vertigo) 10/2009  . Low back pain   . Hypertension, essential    Interval History: He comes in today really feeling well her cardiac standpoint. He has no more episodes of his anginal chest pain or dyspnea. He exercises routinely, trying to walk about 3 miles at a time without any problems.  No chest pain or shortness of breath with rest or exertion. No CHF symptoms of PND, orthopnea or edema. No palpitations, lightheadedness, dizziness, weakness, syncope/near syncope, orTIA/amaurosis fugax symptoms.  ROS: A comprehensive was performed. Review of Systems  Constitutional: Negative for weight loss.  HENT: Negative for nosebleeds.   Respiratory: Negative for cough, shortness of breath and wheezing.   Cardiovascular: Negative for claudication.  Gastrointestinal: Negative for heartburn, blood in stool and melena.  Genitourinary: Negative for hematuria.  Musculoskeletal: Negative for myalgias, joint pain and  falls.  Neurological: Negative for dizziness, sensory change, speech change, focal weakness, seizures, loss of consciousness and weakness.  Endo/Heme/Allergies: Does not bruise/bleed easily.  Psychiatric/Behavioral: Negative for depression and memory loss. The patient is not nervous/anxious and does not have insomnia.   All other systems reviewed and are negative.   Current Outpatient Prescriptions on File Prior to Visit  Medication Sig Dispense Refill  . aspirin 81 MG chewable tablet Chew 1 tablet (81 mg total) by mouth daily.    Marland Kitchen atorvastatin (LIPITOR) 80 MG tablet TAKE 1 TABLET (80MG  TOTAL) BY MOUTH DAILY 90 tablet 0  . Canagliflozin (INVOKANA) 300 MG TABS Take 1 tablet (300 mg total) by mouth daily. 90 tablet 5  . carvedilol (COREG) 12.5 MG tablet TAKE 1 TABLET BY MOUTH TWICE DAILY WITH MEALS 180 tablet 0  . glucose blood test strip Check glucose level before each meal  ONE TOUCH VERIO TEST STRIP 100 each 12  . isosorbide mononitrate (IMDUR) 30 MG 24 hr tablet Take 1 tablet (30 mg total) by mouth daily. 90 tablet 2  . lisinopril (PRINIVIL,ZESTRIL) 2.5 MG tablet Take 1 tablet (2.5 mg total) by mouth daily. 90 tablet 2  . metFORMIN (GLUCOPHAGE) 1000 MG tablet TAKE 1 TABLET BY MOUTH TWICE DAILY WITH MEALS 180 tablet 0  . nitroGLYCERIN (NITROSTAT) 0.4 MG SL tablet Place 1 tablet (0.4 mg total) under the tongue every 5 (five) minutes as needed for chest pain. 25 tablet 2  .  saxagliptin HCl (ONGLYZA) 5 MG TABS tablet Take 1 tablet (5 mg total) by mouth daily. 90 tablet 1   No current facility-administered medications on file prior to visit.   ALLERGIES REVIEWED IN EPIC -- No change SOCIAL AND FAMILY HISTORY REVIEWED IN EPIC -- NO change --> modalities beyond one year , his blood is historically much more expensive. He would like to switch.  Wt Readings from Last 3 Encounters:  04/27/14 175 lb 9.6 oz (79.652 kg)  03/19/14 179 lb (81.194 kg)  01/02/14 176 lb (79.833 kg)    PHYSICAL  EXAM BP 140/84 mmHg  Pulse 83  Ht 5\' 8"  (1.727 m)  Wt 175 lb 9.6 oz (79.652 kg)  BMI 26.71 kg/m2 General appearance: alert, cooperative, appears stated age, no distress and otherwise healthy-appearing HEENT: [place]/AT, EOMI, MMM, anicteric sclera Neck: no adenopathy, no carotid bruit and no JVD Lungs: clear to auscultation bilaterally, normal percussion bilaterally and non-labored Heart: regular rate and rhythm, S1, S2 normal, no murmur, click, rub or gallop; nondisplaced PMI Abdomen: soft, non-tender; bowel sounds normal; no masses,  no organomegaly; no HJR Extremities: extremities normal, atraumatic, no cyanosis, or edema Pulses: 2+ and symmetric; Neurologic: Mental status: Alert, oriented, thought content appropriate Cranial nerves: normal (II-XII grossly intact)   Adult ECG Report  Rate: 83 ;  Rhythm: normal sinus rhythm and LAFB  Narrative Interpretation: otherwise stable EKG  Recent Labs:  None since July 2014. PCP has been checking labs.  ASSESSMENT / PLAN: History of: STEMI (ST elevation myocardial infarction), ant wall He seemed to fully recover from his MI. 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 He is now determined to avoid further complication.  He is exercising like he was recommended a cardiac rehabilitation. He is working closely with his PCP to improve his lipid and diabetes control. No recurrent anginal symptoms. On stable regimen.  CAD (coronary artery disease), native coronary artery - (STEMI) LAD 99% Thrombosis - PCI with Xience eXpedition 3.0 mm x `18 mm (3.4 mm post) No recurrent angina or dyspnea. He is now beyond one year. Because of financial concerns we can switch him from Brilinta to Plavix. He still has also multiple at the left, once that is complete he can then switch to Plavix. We'll put her 3 weeks after starting Plavix we will check a P2Y12 assay to ensure that he is adequate coverage of Plavix.  If so, we can stop aspirin as well to avoid GI  bleed complications.  Continue Imdur for now, but to consider stopping that at next visit. He is on an ACE inhibitor and beta blocker stable dose.  If his blood pressure continues to be 140/80 range I will increase his ACE inhibitor to 5 mg daily when Imdur is stopped.  Continue the remainder of his regimen, including statin.  Cardiomyopathy, ischemic - EF improved to 50 at 55% (near normal)) with distal Anterior - Antero& InferoApical hypokinesis. No active heart failure symptoms. At this point would be diastolic failure it he did have a period continue afterload reduction with ACE inhibitor and beta blocker. He had essentially recovered to near baseline.  Hyperlipidemia associated with type 2 diabetes mellitus Was close to goal in July of 2014. I have not seen any labs since then. Apparently been caught by his PCP. He is on high-dose atorvastatin.  It may be nice to try to reduce the dose to 40 mg if he is getting close to goal. Was recently started on Invokana for his diabetes.  Orders Placed This Encounter  Procedures  . Platelet inhibition p2y12    SCHEDULE FOR WEEK OF May 19, 2014  . EKG 12-Lead   Meds ordered this encounter  Medications  . ticagrelor (BRILINTA) 90 MG TABS tablet    Sig: Take 1 tablet (90 mg total) by mouth 2 (two) times daily.    Dispense:  16 tablet    Refill:  0    Lot [id number] ,EXP 3 /2018 X2  . clopidogrel (PLAVIX) 75 MG tablet    Sig: Take 1 tablet (75 mg total) by mouth daily.    Dispense:  90 tablet    Refill:  3  . INVOKANA 100 MG TABS tablet    Sig:     Refill:  12    Followup: 6 months   HARDING,DAVID W, M.D., M.S. Interventional Cardiologist   Pager # [phone]

## 2014-04-28 NOTE — Assessment & Plan Note (Signed)
No recurrent angina or dyspnea. He is now beyond one year. Because of financial concerns we can switch him from Brilinta to Plavix. He still has also multiple at the left, once that is complete he can then switch to Plavix. We'll put her 3 weeks after starting Plavix we will check a P2Y12 assay to ensure that he is adequate coverage of Plavix.  If so, we can stop aspirin as well to avoid GI bleed complications.  Continue Imdur for now, but to consider stopping that at next visit. He is on an ACE inhibitor and beta blocker stable dose.  If his blood pressure continues to be 140/80 range I will increase his ACE inhibitor to 5 mg daily when Imdur is stopped.  Continue the remainder of his regimen, including statin.

## 2014-04-28 NOTE — Assessment & Plan Note (Signed)
No active heart failure symptoms. At this point would be diastolic failure it he did have a period continue afterload reduction with ACE inhibitor and beta blocker. He had essentially recovered to near baseline.

## 2014-04-28 NOTE — Assessment & Plan Note (Addendum)
Was close to goal in July of 2014. I have not seen any labs since then. Apparently been caught by his PCP. He is on high-dose atorvastatin.  It may be nice to try to reduce the dose to 40 mg if he is getting close to goal. Was recently started on Invokana for his diabetes.

## 2014-05-06 ENCOUNTER — Other Ambulatory Visit: Payer: Self-pay

## 2014-05-06 ENCOUNTER — Other Ambulatory Visit: Payer: Self-pay | Admitting: Internal Medicine

## 2014-05-06 ENCOUNTER — Telehealth: Payer: Self-pay | Admitting: Internal Medicine

## 2014-05-06 MED ORDER — ISOSORBIDE MONONITRATE ER 30 MG PO TB24: 30.0000 mg | ORAL_TABLET | Freq: Every day | ORAL | Status: DC

## 2014-05-06 MED ORDER — SAXAGLIPTIN HCL 5 MG PO TABS: 5.0000 mg | ORAL_TABLET | Freq: Every day | ORAL | Status: DC

## 2014-05-06 NOTE — Telephone Encounter (Signed)
Spoke to pt, told him he would have to check with insurance to see if there is a medication comparable to Onglyza and let me know. Pt verbalized understanding.   Called pt back told him I resent medication in a generic, it was sent Brand. Told him to see if cheaper if not let me know what insurance says and I can see if I can change medication. Pt verbalized understanding.

## 2014-05-06 NOTE — Telephone Encounter (Signed)
Rx sent to pharmacy   

## 2014-05-06 NOTE — Telephone Encounter (Signed)
Pt is calling to let md know he can not afford onglyza 5 mg cost is over 200.00 from [facility]. Pt would like similar med sent to [facility]

## 2014-05-07 ENCOUNTER — Encounter (HOSPITAL_COMMUNITY): Payer: Self-pay | Admitting: Cardiology

## 2014-05-07 MED ORDER — CARVEDILOL 12.5 MG PO TABS: 12.5000 mg | ORAL_TABLET | Freq: Two times a day (BID) | ORAL | Status: DC

## 2014-05-15 ENCOUNTER — Encounter: Payer: Self-pay | Admitting: Internal Medicine

## 2014-06-03 NOTE — Telephone Encounter (Signed)
Left message on voicemail to call office.  

## 2014-06-04 NOTE — Telephone Encounter (Signed)
Pt called back, said he did get message from Dr. Raliegh Ip

## 2014-06-08 ENCOUNTER — Ambulatory Visit (HOSPITAL_COMMUNITY)
Admission: RE | Admit: 2014-06-08 | Discharge: 2014-06-08 | Disposition: A | Discharge status: Home / Self Care | Payer: Managed Care, Other (non HMO) | Source: Ambulatory Visit | Attending: Cardiology | Admitting: Cardiology

## 2014-06-08 DIAGNOSIS — Z5181 Encounter for therapeutic drug level monitoring: Secondary | ICD-10-CM | POA: Diagnosis present

## 2014-06-08 DIAGNOSIS — Z7902 Long term (current) use of antithrombotics/antiplatelets: Secondary | ICD-10-CM | POA: Diagnosis not present

## 2014-06-08 LAB — PLATELET INHIBITION P2Y12: Platelet Function  P2Y12: 158 [PRU] — ABNORMAL LOW (ref 194–418)

## 2014-06-19 ENCOUNTER — Encounter: Payer: Managed Care, Other (non HMO) | Admitting: Internal Medicine

## 2014-06-22 ENCOUNTER — Ambulatory Visit (INDEPENDENT_AMBULATORY_CARE_PROVIDER_SITE_OTHER): Payer: Managed Care, Other (non HMO) | Admitting: Internal Medicine

## 2014-06-22 ENCOUNTER — Encounter: Payer: Self-pay | Admitting: Internal Medicine

## 2014-06-22 VITALS — BP 132/90 | HR 82 | Temp 97.9°F | Resp 20 | Ht 67.5 in | Wt 177.0 lb

## 2014-06-22 DIAGNOSIS — Z0000 Encounter for general adult medical examination without abnormal findings: Secondary | ICD-10-CM

## 2014-06-22 DIAGNOSIS — D126 Benign neoplasm of colon, unspecified: Secondary | ICD-10-CM

## 2014-06-22 DIAGNOSIS — I2510 Atherosclerotic heart disease of native coronary artery without angina pectoris: Secondary | ICD-10-CM

## 2014-06-22 DIAGNOSIS — Z9861 Coronary angioplasty status: Secondary | ICD-10-CM

## 2014-06-22 DIAGNOSIS — E118 Type 2 diabetes mellitus with unspecified complications: Secondary | ICD-10-CM

## 2014-06-22 LAB — COMPREHENSIVE METABOLIC PANEL
ALT: 23 U/L (ref 0–53)
AST: 22 U/L (ref 0–37)
Albumin: 4.8 g/dL (ref 3.5–5.2)
Alkaline Phosphatase: 37 U/L — ABNORMAL LOW (ref 39–117)
BUN: 15 mg/dL (ref 6–23)
CALCIUM: 10.4 mg/dL (ref 8.4–10.5)
CO2: 30 mEq/L (ref 19–32)
Chloride: 102 mEq/L (ref 96–112)
Creatinine, Ser: 0.86 mg/dL (ref 0.40–1.50)
GFR: 118 mL/min (ref >60.00)
Glucose, Bld: 101 mg/dL — ABNORMAL HIGH (ref 70–99)
Potassium: 4.4 mEq/L (ref 3.5–5.1)
SODIUM: 139 meq/L (ref 135–145)
TOTAL PROTEIN: 7.7 g/dL (ref 6.0–8.3)
Total Bilirubin: 0.5 mg/dL (ref 0.2–1.2)

## 2014-06-22 LAB — CBC WITH DIFFERENTIAL/PLATELET
BASOS PCT: 0.4 % (ref 0.0–3.0)
Basophils Absolute: 0 10*3/uL (ref 0.0–0.1)
Eosinophils Absolute: 0.2 10*3/uL (ref 0.0–0.7)
Eosinophils Relative: 2.7 % (ref 0.0–5.0)
HCT: 44.9 % (ref 39.0–52.0)
HEMOGLOBIN: 14.5 g/dL (ref 13.0–17.0)
LYMPHS ABS: 3 10*3/uL (ref 0.7–4.0)
LYMPHS PCT: 44.5 % (ref 12.0–46.0)
MCHC: 32.4 g/dL (ref 30.0–36.0)
MCV: 87.6 fl (ref 78.0–100.0)
MONOS PCT: 9.3 % (ref 3.0–12.0)
Monocytes Absolute: 0.6 10*3/uL (ref 0.1–1.0)
Neutro Abs: 2.9 10*3/uL (ref 1.4–7.7)
Neutrophils Relative %: 43.1 % (ref 43.0–77.0)
Platelets: 240 10*3/uL (ref 150.0–400.0)
RBC: 5.12 Mil/uL (ref 4.22–5.81)
RDW: 14.4 % (ref 11.5–15.5)
WBC: 6.8 10*3/uL (ref 4.0–10.5)

## 2014-06-22 LAB — LIPID PANEL
CHOL/HDL RATIO: 4
Cholesterol: 118 mg/dL (ref 0–200)
HDL: 29.8 mg/dL — ABNORMAL LOW (ref >39.00)
LDL Cholesterol: 71 mg/dL (ref 0–99)
NONHDL: 88.2
Triglycerides: 87 mg/dL (ref 0.0–149.0)
VLDL: 17.4 mg/dL (ref 0.0–40.0)

## 2014-06-22 LAB — TSH: TSH: 1.96 u[IU]/mL (ref 0.35–4.50)

## 2014-06-22 LAB — MICROALBUMIN / CREATININE URINE RATIO
Creatinine,U: 132.3 mg/dL
MICROALB UR: 0.9 mg/dL (ref 0.0–1.9)
Microalb Creat Ratio: 0.7 mg/g (ref 0.0–30.0)

## 2014-06-22 LAB — HEMOGLOBIN A1C: HEMOGLOBIN A1C: 7.1 % — AB (ref 4.6–6.5)

## 2014-06-22 LAB — PSA: PSA: 1.13 ng/mL (ref 0.10–4.00)

## 2014-06-22 MED ORDER — LISINOPRIL 5 MG PO TABS: 5.0000 mg | ORAL_TABLET | Freq: Every day | ORAL | Status: DC

## 2014-06-22 NOTE — Progress Notes (Signed)
Subjective:    Patient ID: Matthew Prince, male    DOB: Jun 23, 1955, 59 y.o.   MRN: [id number]  HPI  59 year old patient who is seen today for a annual exam.  He is followed by cardiology following a ST segment elevation MI in 2014.  He is status post PCI and remains on both aspirin and Plavix.  His cardiac status is stable.  He walks 3 miles 2 times per week and is active  throughout the week as well. His last hemoglobin A1c 6.6.   He has been seen by Gen. surgery for a perirectal abscess.  This has been stable.  Current Allergies:  IODINE (IODINE)   Past Medical History:   CAD  STEMI, PCI  2014 hypercholesterolemia  Allergic rhinitis  Low back pain  Diabetes mellitus, type II   Past Surgical History:   Denies surgical history  resection benign tumor, posterior neck   Family History:   father died age 49, unclear cause  mother died age 64, unclear cause. Both lived in [place]   Social History:   Never Smoked  Alcohol use-no  Occupation: [employer]  Married-one son one daughter  born in [place]. he has 4 brothers 4 sisters details of their health unknown     Review of Systems  Constitutional: Negative for fever, chills, activity change, appetite change and fatigue.  HENT: Negative for congestion, dental problem, ear pain, hearing loss, mouth sores, rhinorrhea, sinus pressure, sneezing, tinnitus, trouble swallowing and voice change.   Eyes: Negative for photophobia, pain, redness and visual disturbance.  Respiratory: Negative for apnea, cough, choking, chest tightness, shortness of breath and wheezing.   Cardiovascular: Negative for chest pain, palpitations and leg swelling.  Gastrointestinal: Negative for nausea, vomiting, abdominal pain, diarrhea, constipation, blood in stool, abdominal distention, anal bleeding and rectal pain.  Genitourinary: Negative for dysuria, urgency, frequency, hematuria, flank pain, decreased urine volume, discharge, penile  swelling, scrotal swelling, difficulty urinating, genital sores and testicular pain.  Musculoskeletal: Negative for myalgias, back pain, joint swelling, arthralgias, gait problem, neck pain and neck stiffness.  Skin: Negative for color change, rash and wound.  Neurological: Negative for dizziness, tremors, seizures, syncope, facial asymmetry, speech difficulty, weakness, light-headedness, numbness and headaches.  Hematological: Negative for adenopathy. Does not bruise/bleed easily.  Psychiatric/Behavioral: Negative for suicidal ideas, hallucinations, behavioral problems, confusion, sleep disturbance, self-injury, dysphoric mood, decreased concentration and agitation. The patient is not nervous/anxious.        Objective:   Physical Exam  Constitutional: He appears well-developed and well-nourished.  Blood pressure 130/80  HENT:  Head: Normocephalic and atraumatic.  Right Ear: External ear normal.  Left Ear: External ear normal.  Nose: Nose normal.  Mouth/Throat: Oropharynx is clear and moist.  Eyes: Conjunctivae and EOM are normal. Pupils are equal, round, and reactive to light. No scleral icterus.  Neck: Normal range of motion. Neck supple. No JVD present. No thyromegaly present.  Cardiovascular: Regular rhythm, normal heart sounds and intact distal pulses.  Exam reveals no gallop and no friction rub.   No murmur heard. Dorsalis pedis pulses faint  Pulmonary/Chest: Effort normal and breath sounds normal. He exhibits no tenderness.  Abdominal: Soft. Bowel sounds are normal. He exhibits no distension and no mass. There is no tenderness.  Genitourinary: Prostate normal and penis normal.  Musculoskeletal: Normal range of motion. He exhibits no edema or tenderness.  Lymphadenopathy:    He has no cervical adenopathy.  Neurological: He is alert. He has normal reflexes. No cranial nerve deficit.  Coordination normal.  Skin: Skin is warm and dry. No rash noted.  Psychiatric: He has a normal  mood and affect. His behavior is normal.          Assessment & Plan:   Preventive health examination Coronary artery disease.  Aspirin therapy will be discontinued.  Continue Plavix Hypertension, well-controlled.  We'll increase lisinopril to 5 mg daily Diabetes mellitus.  Will check a hemoglobin A1c, urine for microalbumin Dyslipidemia.  Check lipid profile  Follow-up cardiology.  To consider discontinuation of Imdur.  May need up titration of lisinopril Return in 3 months for follow-up

## 2014-06-22 NOTE — Patient Instructions (Signed)
Discontinue aspirin therapy Increase lisinopril to 5 mg daily  Limit your sodium (Salt) intake    It is important that you exercise regularly, at least 20 minutes 3 to 4 times per week.  If you develop chest pain or shortness of breath seek  medical attention.   Please check your hemoglobin A1c every 3 months  Health Maintenance A healthy lifestyle and preventative care can promote health and wellness.  Maintain regular health, dental, and eye exams.  Eat a healthy diet. Foods like vegetables, fruits, whole grains, low-fat dairy products, and lean protein foods contain the nutrients you need and are low in calories. Decrease your intake of foods high in solid fats, added sugars, and salt. Get information about a proper diet from your health care provider, if necessary.  Regular physical exercise is one of the most important things you can do for your health. Most adults should get at least 150 minutes of moderate-intensity exercise (any activity that increases your heart rate and causes you to sweat) each week. In addition, most adults need muscle-strengthening exercises on 2 or more days a week.   Maintain a healthy weight. The body mass index (BMI) is a screening tool to identify possible weight problems. It provides an estimate of body fat based on height and weight. Your health care provider can find your BMI and can help you achieve or maintain a healthy weight. For males 20 years and older:  A BMI below 18.5 is considered underweight.  A BMI of 18.5 to 24.9 is normal.  A BMI of 25 to 29.9 is considered overweight.  A BMI of 30 and above is considered obese.  Maintain normal blood lipids and cholesterol by exercising and minimizing your intake of saturated fat. Eat a balanced diet with plenty of fruits and vegetables. Blood tests for lipids and cholesterol should begin at age 72 and be repeated every 5 years. If your lipid or cholesterol levels are high, you are over age 62, or you  are at high risk for heart disease, you may need your cholesterol levels checked more frequently.Ongoing high lipid and cholesterol levels should be treated with medicines if diet and exercise are not working.  If you smoke, find out from your health care provider how to quit. If you do not use tobacco, do not start.  Lung cancer screening is recommended for adults aged 24-80 years who are at high risk for developing lung cancer because of a history of smoking. A yearly low-dose CT scan of the lungs is recommended for people who have at least a 30-pack-year history of smoking and are current smokers or have quit within the past 15 years. A pack year of smoking is smoking an average of 1 pack of cigarettes a day for 1 year (for example, a 30-pack-year history of smoking could mean smoking 1 pack a day for 30 years or 2 packs a day for 15 years). Yearly screening should continue until the smoker has stopped smoking for at least 15 years. Yearly screening should be stopped for people who develop a health problem that would prevent them from having lung cancer treatment.  If you choose to drink alcohol, do not have more than 2 drinks per day. One drink is considered to be 12 oz (360 mL) of beer, 5 oz (150 mL) of wine, or 1.5 oz (45 mL) of liquor.  Avoid the use of street drugs. Do not share needles with anyone. Ask for help if you need support or  instructions about stopping the use of drugs.  High blood pressure causes heart disease and increases the risk of stroke. Blood pressure should be checked at least every 1-2 years. Ongoing high blood pressure should be treated with medicines if weight loss and exercise are not effective.  If you are 55-53 years old, ask your health care provider if you should take aspirin to prevent heart disease.  Diabetes screening involves taking a blood sample to check your fasting blood sugar level. This should be done once every 3 years after age 24 if you are at a normal  weight and without risk factors for diabetes. Testing should be considered at a younger age or be carried out more frequently if you are overweight and have at least 1 risk factor for diabetes.  Colorectal cancer can be detected and often prevented. Most routine colorectal cancer screening begins at the age of 38 and continues through age 29. However, your health care provider may recommend screening at an earlier age if you have risk factors for colon cancer. On a yearly basis, your health care provider may provide home test kits to check for hidden blood in the stool. A small camera at the end of a tube may be used to directly examine the colon (sigmoidoscopy or colonoscopy) to detect the earliest forms of colorectal cancer. Talk to your health care provider about this at age 66 when routine screening begins. A direct exam of the colon should be repeated every 5-10 years through age 16, unless early forms of precancerous polyps or small growths are found.  People who are at an increased risk for hepatitis B should be screened for this virus. You are considered at high risk for hepatitis B if:  You were born in a country where hepatitis B occurs often. Talk with your health care provider about which countries are considered high risk.  Your parents were born in a high-risk country and you have not received a shot to protect against hepatitis B (hepatitis B vaccine).  You have HIV or AIDS.  You use needles to inject street drugs.  You live with, or have sex with, someone who has hepatitis B.  You are a man who has sex with other men (MSM).  You get hemodialysis treatment.  You take certain medicines for conditions like cancer, organ transplantation, and autoimmune conditions.  Hepatitis C blood testing is recommended for all people born from [birth year] through 1963 and any individual with known risk factors for hepatitis C.  Healthy men should no longer receive prostate-specific antigen (PSA) blood  tests as part of routine cancer screening. Talk to your health care provider about prostate cancer screening.  Testicular cancer screening is not recommended for adolescents or adult males who have no symptoms. Screening includes self-exam, a health care provider exam, and other screening tests. Consult with your health care provider about any symptoms you have or any concerns you have about testicular cancer.  Practice safe sex. Use condoms and avoid high-risk sexual practices to reduce the spread of sexually transmitted infections (STIs).  You should be screened for STIs, including gonorrhea and chlamydia if:  You are sexually active and are younger than 24 years.  You are older than 24 years, and your health care provider tells you that you are at risk for this type of infection.  Your sexual activity has changed since you were last screened, and you are at an increased risk for chlamydia or gonorrhea. Ask your health care provider if  you are at risk.  If you are at risk of being infected with HIV, it is recommended that you take a prescription medicine daily to prevent HIV infection. This is called pre-exposure prophylaxis (PrEP). You are considered at risk if:  You are a man who has sex with other men (MSM).  You are a heterosexual man who is sexually active with multiple partners.  You take drugs by injection.  You are sexually active with a partner who has HIV.  Talk with your health care provider about whether you are at high risk of being infected with HIV. If you choose to begin PrEP, you should first be tested for HIV. You should then be tested every 3 months for as long as you are taking PrEP.  Use sunscreen. Apply sunscreen liberally and repeatedly throughout the day. You should seek shade when your shadow is shorter than you. Protect yourself by wearing long sleeves, pants, a wide-brimmed hat, and sunglasses year round whenever you are outdoors.  Tell your health care  provider of new moles or changes in moles, especially if there is a change in shape or color. Also, tell your health care provider if a mole is larger than the size of a pencil eraser.  A one-time screening for abdominal aortic aneurysm (AAA) and surgical repair of large AAAs by ultrasound is recommended for men aged 40-75 years who are current or former smokers.  Stay current with your vaccines (immunizations). Document Released: 11/11/2007 Document Revised: 05/20/2013 Document Reviewed: 10/10/2010 [facility] Patient Information 2015 [place], [place]. This information is not intended to replace advice given to you by your health care provider. Make sure you discuss any questions you have with your health care provider. Cardiac Diet This diet can help prevent heart disease and stroke. Many factors influence your heart health, including eating and exercise habits. Coronary risk rises a lot with abnormal blood fat (lipid) levels. Cardiac meal planning includes limiting unhealthy fats, increasing healthy fats, and making other small dietary changes. General guidelines are as follows:  Adjust calorie intake to reach and maintain desirable body weight.  Limit total fat intake to less than 30% of total calories. Saturated fat should be less than 7% of calories.  Saturated fats are found in animal products and in some vegetable products. Saturated vegetable fats are found in coconut oil, cocoa butter, palm oil, and palm kernel oil. Read labels carefully to avoid these products as much as possible. Use butter in moderation. Choose tub margarines and oils that have 2 grams of fat or less. Good cooking oils are canola and olive oils.  Practice low-fat cooking techniques. Do not fry food. Instead, broil, bake, boil, steam, grill, roast on a rack, stir-fry, or microwave it. Other fat reducing suggestions include:  Remove the skin from poultry.  Remove all visible fat from meats.  Skim the fat off stews,  soups, and gravies before serving them.  Steam vegetables in water or broth instead of sauting them in fat.  Avoid foods with trans fat (or hydrogenated oils), such as commercially fried foods and commercially baked goods. Commercial shortening and deep-frying fats will contain trans fat.  Increase intake of fruits, vegetables, whole grains, and legumes to replace foods high in fat.  Increase consumption of nuts, legumes, and seeds to at least 4 servings weekly. One serving of a legume equals  cup, and 1 serving of nuts or seeds equals  cup.  Choose whole grains more often. Have 3 servings per day (a serving  is 1 ounce [oz]).  Eat 4 to 5 servings of vegetables per day. A serving of vegetables is 1 cup of raw leafy vegetables;  cup of raw or cooked cut-up vegetables;  cup of vegetable juice.  Eat 4 to 5 servings of fruit per day. A serving of fruit is 1 medium whole fruit;  cup of dried fruit;  cup of fresh, frozen, or canned fruit;  cup of 100% fruit juice.  Increase your intake of dietary fiber to 20 to 30 grams per day. Insoluble fiber may help lower your risk of heart disease and may help curb your appetite.  Soluble fiber binds cholesterol to be removed from the blood. Foods high in soluble fiber are dried beans, citrus fruits, oats, apples, bananas, broccoli, Brussels sprouts, and eggplant.  Try to include foods fortified with plant sterols or stanols, such as yogurt, breads, juices, or margarines. Choose several fortified foods to achieve a daily intake of 2 to 3 grams of plant sterols or stanols.  Foods with omega-3 fats can help reduce your risk of heart disease. Aim to have a 3.5 oz portion of fatty fish twice per week, such as salmon, mackerel, albacore tuna, sardines, lake trout, or herring. If you wish to take a fish oil supplement, choose one that contains 1 gram of both DHA and EPA.  Limit processed meats to 2 servings (3 oz portion) weekly.  Limit the sodium in your  diet to 1500 milligrams (mg) per day. If you have high blood pressure, talk to a registered dietitian about a DASH (Dietary Approaches to Stop Hypertension) eating plan.  Limit sweets and beverages with added sugar, such as soda, to no more than 5 servings per week. One serving is:   1 tablespoon sugar.  1 tablespoon jelly or jam.   cup sorbet.  1 cup lemonade.   cup regular soda. CHOOSING FOODS Starches  Allowed: Breads: All kinds (wheat, rye, raisin, white, oatmeal, [place], [place], and English muffin bread). Low-fat rolls: English muffins, frankfurter and hamburger buns, bagels, pita bread, tortillas (not fried). Pancakes, waffles, biscuits, and muffins made with recommended oil.  Avoid: Products made with saturated or trans fats, oils, or whole milk products. Butter rolls, cheese breads, croissants. Commercial doughnuts, muffins, sweet rolls, biscuits, waffles, pancakes, store-bought mixes. Crackers  Allowed: Low-fat crackers and snacks: Animal, graham, rye, saltine (with recommended oil, no lard), oyster, and matzo crackers. Bread sticks, melba toast, rusks, flatbread, pretzels, and light popcorn.  Avoid: High-fat crackers: cheese crackers, butter crackers, and those made with coconut, palm oil, or trans fat (hydrogenated oils). Buttered popcorn. Cereals  Allowed: Hot or cold whole-grain cereals.  Avoid: Cereals containing coconut, hydrogenated vegetable fat, or animal fat. Potatoes / Pasta / Rice  Allowed: All kinds of potatoes, rice, and pasta (such as macaroni, spaghetti, and noodles).  Avoid: Pasta or rice prepared with cream sauce or high-fat cheese. Chow mein noodles, [place] fries. Vegetables  Allowed: All vegetables and vegetable juices.  Avoid: Fried vegetables. Vegetables in cream, butter, or high-fat cheese sauces. Limit coconut. Fruit in cream or custard. Protein  Allowed: Limit your intake of meat, seafood, and poultry to no more than 6 oz (cooked weight)  per day. All lean, well-trimmed beef, veal, pork, and lamb. All chicken and [place] without skin. All fish and shellfish. Wild game: wild duck, rabbit, pheasant, and venison. Egg whites or low-cholesterol egg substitutes may be used as desired. Meatless dishes: recipes with dried beans, peas, lentils, and tofu (soybean curd). Seeds and  nuts: all seeds and most nuts.  Avoid: Prime grade and other heavily marbled and fatty meats, such as short ribs, spare ribs, rib eye roast or steak, frankfurters, sausage, bacon, and high-fat luncheon meats, mutton. Caviar. Commercially fried fish. Domestic duck, goose, venison sausage. Organ meats: liver, gizzard, heart, chitterlings, brains, kidney, sweetbreads. Dairy  Allowed: Low-fat cheeses: nonfat or low-fat cottage cheese (1% or 2% fat), cheeses made with part skim milk, such as mozzarella, farmers, string, or ricotta. (Cheeses should be labeled no more than 2 to 6 grams fat per oz.). Skim (or 1%) milk: liquid, powdered, or evaporated. Buttermilk made with low-fat milk. Drinks made with skim or low-fat milk or cocoa. Chocolate milk or cocoa made with skim or low-fat (1%) milk. Nonfat or low-fat yogurt.  Avoid: Whole milk cheeses, including colby, cheddar, [place], Monterey Jack, [place], [place], [place], American, Swiss, and blue. Creamed cottage cheese, cream cheese. Whole milk and whole milk products, including buttermilk or yogurt made from whole milk, drinks made from whole milk. Condensed milk, evaporated whole milk, and 2% milk. Soups and Combination Foods  Allowed: Low-fat low-sodium soups: broth, dehydrated soups, homemade broth, soups with the fat removed, homemade cream soups made with skim or low-fat milk. Low-fat spaghetti, lasagna, chili, and Spanish rice if low-fat ingredients and low-fat cooking techniques are used.  Avoid: Cream soups made with whole milk, cream, or high-fat cheese. All other soups. Desserts and Sweets  Allowed: Sherbet, fruit  ices, gelatins, meringues, and angel food cake. Homemade desserts with recommended fats, oils, and milk products. Jam, jelly, honey, marmalade, sugars, and syrups. Pure sugar candy, such as gum drops, hard candy, jelly beans, marshmallows, mints, and small amounts of dark chocolate.  Avoid: Commercially prepared cakes, pies, cookies, frosting, pudding, or mixes for these products. Desserts containing whole milk products, chocolate, coconut, lard, palm oil, or palm kernel oil. Ice cream or ice cream drinks. Candy that contains chocolate, coconut, butter, hydrogenated fat, or unknown ingredients. Buttered syrups. Fats and Oils  Allowed: Vegetable oils: safflower, sunflower, corn, soybean, cottonseed, sesame, canola, olive, or peanut. Non-hydrogenated margarines. Salad dressing or mayonnaise: homemade or commercial, made with a recommended oil. Low or nonfat salad dressing or mayonnaise.  Limit added fats and oils to 6 to 8 tsp per day (includes fats used in cooking, baking, salads, and spreads on bread). Remember to count the "hidden fats" in foods.  Avoid: Solid fats and shortenings: butter, lard, salt pork, bacon drippings. Gravy containing meat fat, shortening, or suet. Cocoa butter, coconut. Coconut oil, palm oil, palm kernel oil, or hydrogenated oils: these ingredients are often used in bakery products, nondairy creamers, whipped toppings, candy, and commercially fried foods. Read labels carefully. Salad dressings made of unknown oils, sour cream, or cheese, such as blue cheese and Roquefort. Cream, all kinds: half-and-half, light, heavy, or whipping. Sour cream or cream cheese (even if "light" or low-fat). Nondairy cream substitutes: coffee creamers and sour cream substitutes made with palm, palm kernel, hydrogenated oils, or coconut oil. Beverages  Allowed: Coffee (regular or decaffeinated), tea. Diet carbonated beverages, mineral water. Alcohol: Check with your caregiver. Moderation is  recommended.  Avoid: Whole milk, regular sodas, and juice drinks with added sugar. Condiments  Allowed: All seasonings and condiments. Cocoa powder. "Cream" sauces made with recommended ingredients.  Avoid: Carob powder made with hydrogenated fats. SAMPLE MENU Breakfast   cup orange juice   cup oatmeal  1 slice toast  1 tsp margarine  1 cup skim milk Lunch  [place] sandwich with  2 oz [place], 2 slices bread  Lettuce and tomato slices  Fresh fruit  Carrot sticks  Coffee or tea Snack  Fresh fruit or low-fat crackers Dinner  3 oz lean ground beef  1 baked potato  1 tsp margarine   cup asparagus  Lettuce salad  1 tbs non-creamy dressing   cup peach slices  1 cup skim milk Document Released: 02/22/2008 Document Revised: 11/14/2011 Document Reviewed: 07/15/2013 [employer] Patient Information 2015 [place], [place]. This information is not intended to replace advice given to you by your health care provider. Make sure you discuss any questions you have with your health care provider.

## 2014-06-25 ENCOUNTER — Encounter: Payer: Self-pay | Admitting: Internal Medicine

## 2014-11-12 ENCOUNTER — Other Ambulatory Visit: Payer: Self-pay | Admitting: Internal Medicine

## 2015-02-22 ENCOUNTER — Other Ambulatory Visit: Payer: Self-pay | Admitting: Cardiology

## 2015-02-22 ENCOUNTER — Other Ambulatory Visit: Payer: Self-pay | Admitting: Internal Medicine

## 2015-02-23 NOTE — Telephone Encounter (Signed)
REFILL 

## 2015-03-29 ENCOUNTER — Other Ambulatory Visit: Payer: Self-pay | Admitting: Cardiology

## 2015-03-31 ENCOUNTER — Other Ambulatory Visit: Payer: Self-pay

## 2015-03-31 MED ORDER — CLOPIDOGREL BISULFATE 75 MG PO TABS: 75.0000 mg | ORAL_TABLET | Freq: Every day | ORAL | Status: DC

## 2015-03-31 MED ORDER — CARVEDILOL 12.5 MG PO TABS: 12.5000 mg | ORAL_TABLET | Freq: Two times a day (BID) | ORAL | Status: DC

## 2015-05-29 ENCOUNTER — Other Ambulatory Visit: Payer: Self-pay | Admitting: Internal Medicine

## 2015-06-21 IMAGING — CT CT PELVIS W/O CM
2 of 3 series · 16 of 46 positions shown, 18 images · non-contrast
Comparison: None.

CLINICAL DATA: Rectal pain

EXAM:
CT PELVIS WITHOUT CONTRAST
TECHNIQUE: Multidetector CT imaging of the pelvis was performed following the
standard protocol without intravenous contrast. Oral contrast was
not administered. Intravenous contrast was not administered due to
history of allergic reaction to iodinated contrast material.

[Series 2: pelvis 5.0 i30f 1 · axial · 0.79mm/px · z∈[+821,+1056]mm · 13 of 55 slices shown, 15 images]
[im 4/55  soft-tissue]
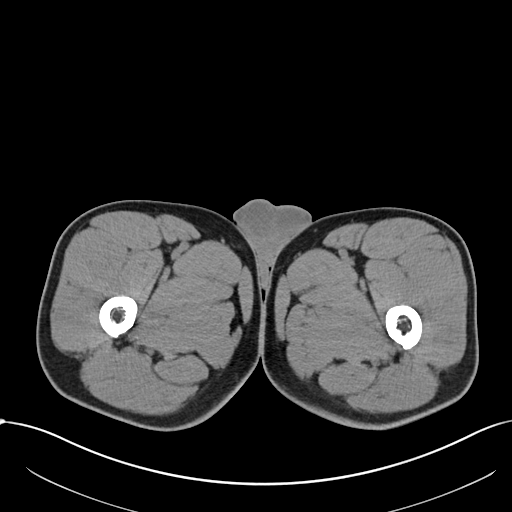
[im 4/55  bone]
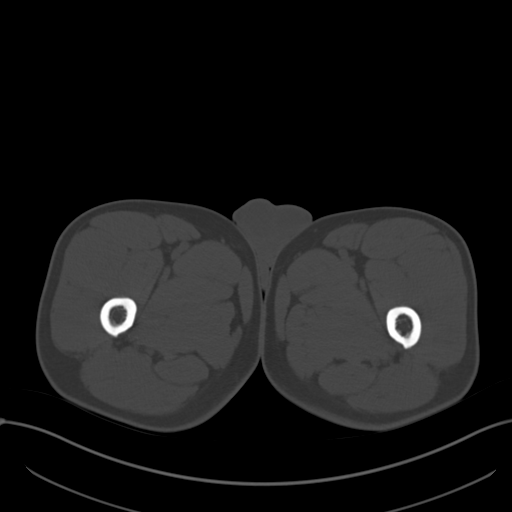
[im 7/55  soft-tissue]
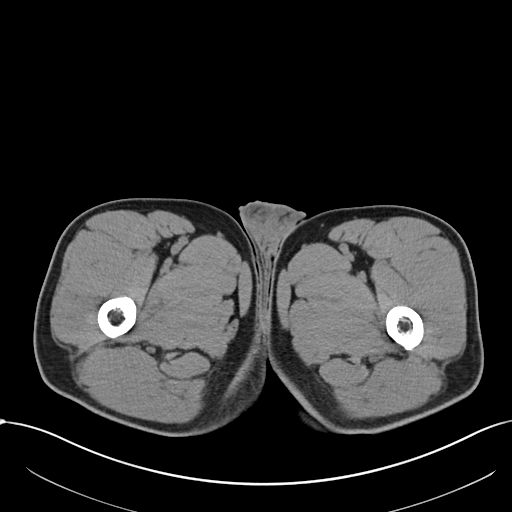
[im 11/55  soft-tissue]
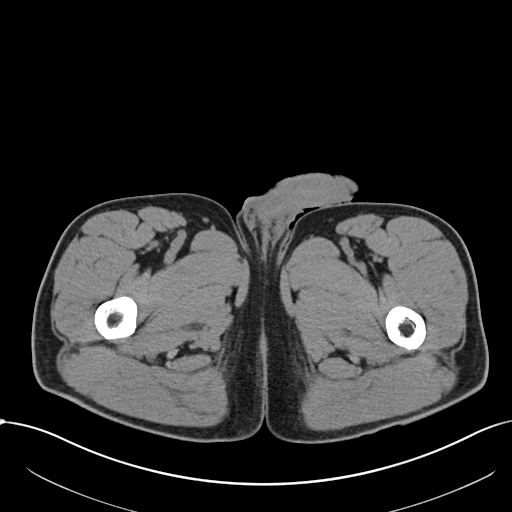
[im 16/55  soft-tissue]
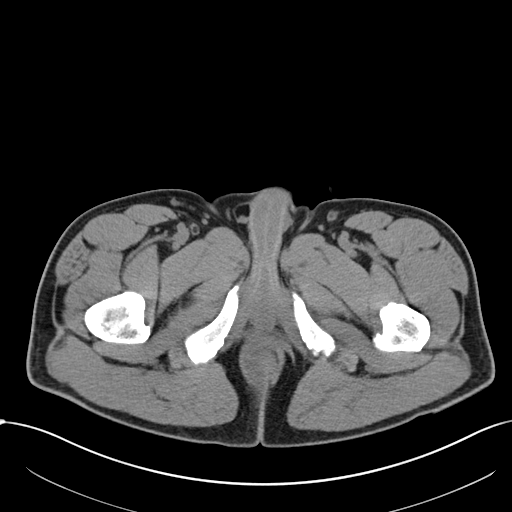
[im 20/55  soft-tissue]
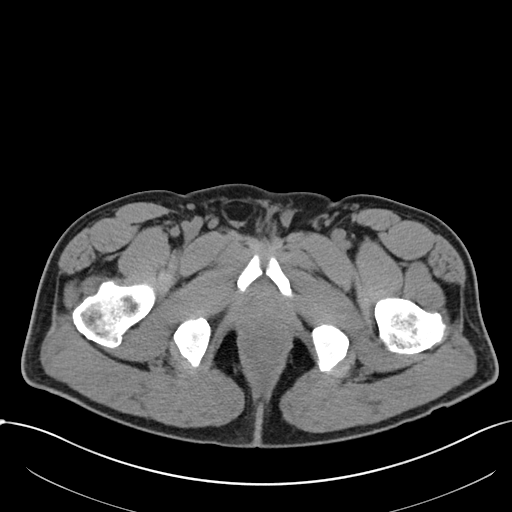
[im 23/55  soft-tissue]
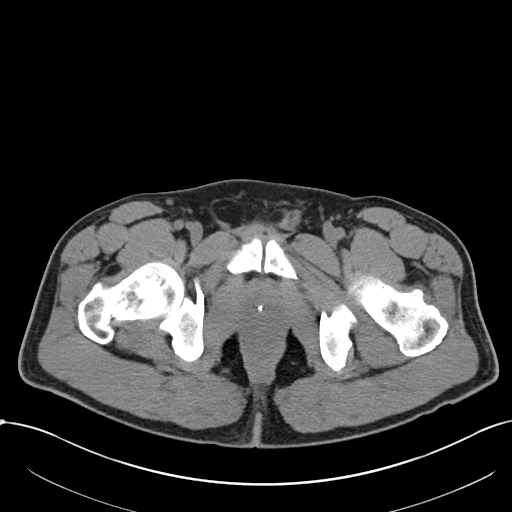
[im 28/55  soft-tissue]
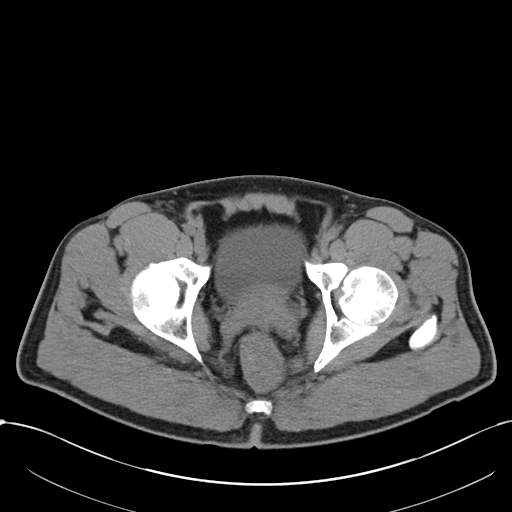
[im 32/55  soft-tissue]
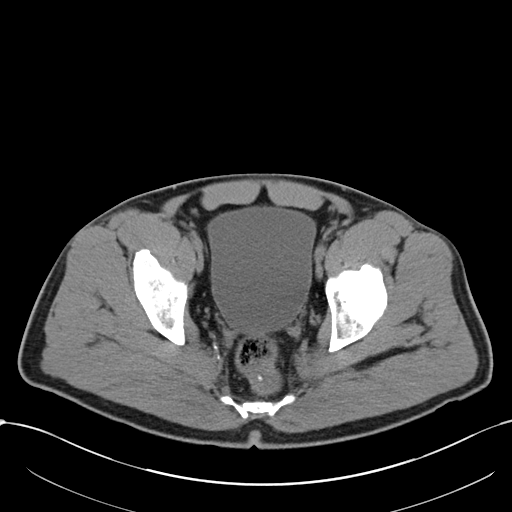
[im 35/55  soft-tissue]
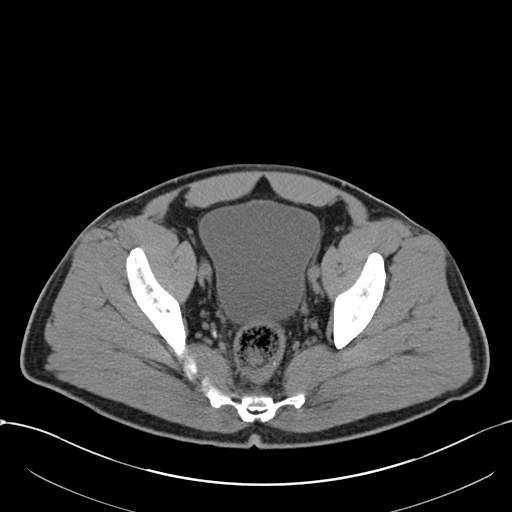
[im 35/55  bone]
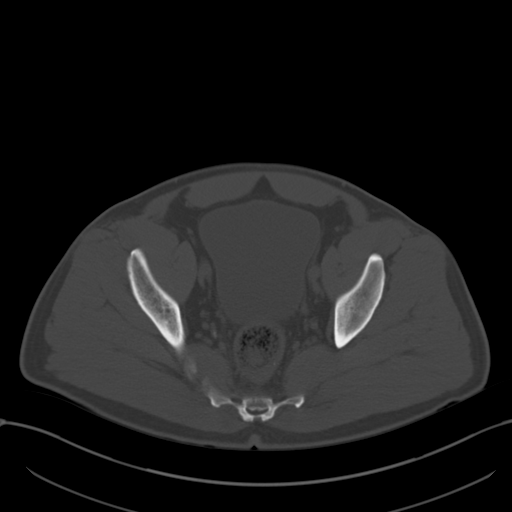
[im 39/55  soft-tissue]
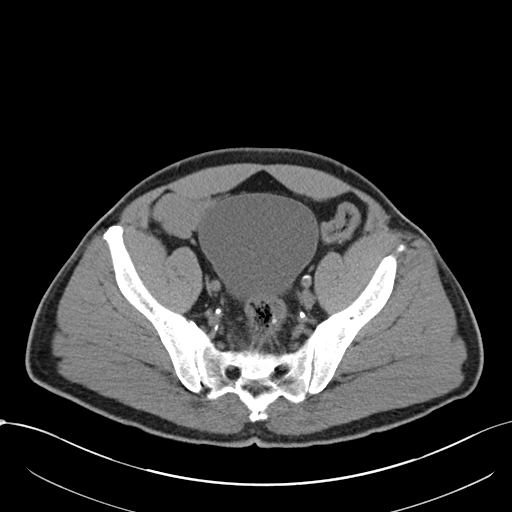
[im 44/55  soft-tissue]
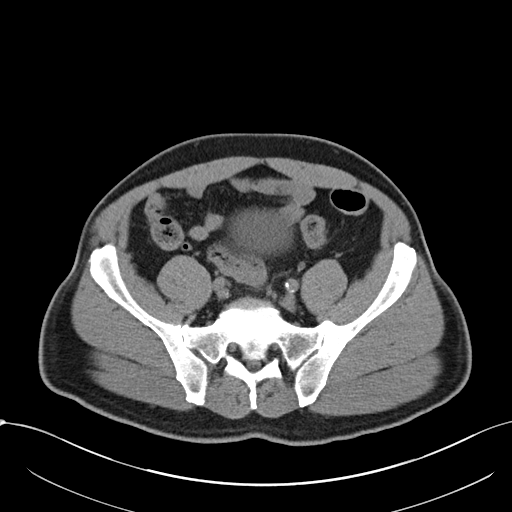
[im 48/55  soft-tissue]
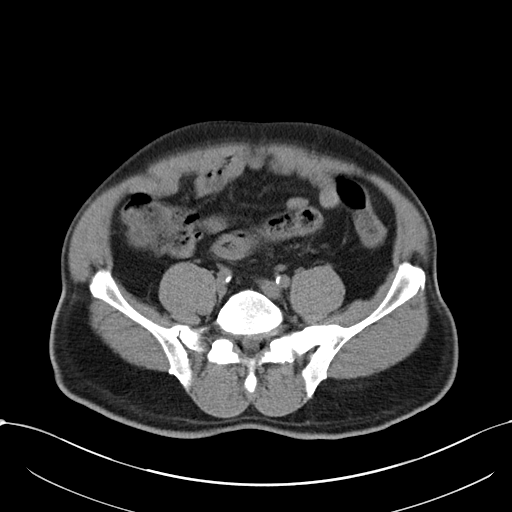
[im 51/55  soft-tissue]
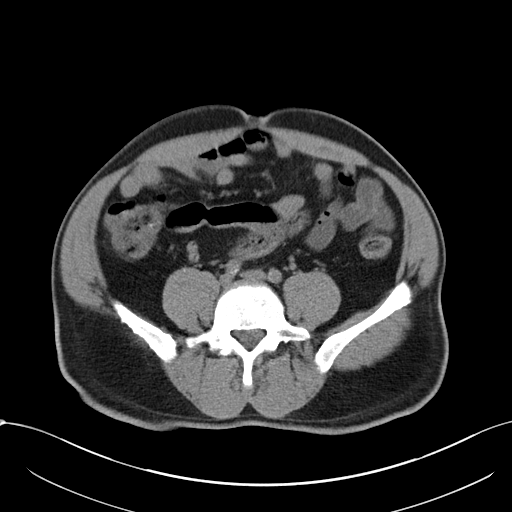

[Series 4: cor st · coronal · 0.53mm/px · 3 of 77 slices shown]
[im 26/77  soft-tissue]
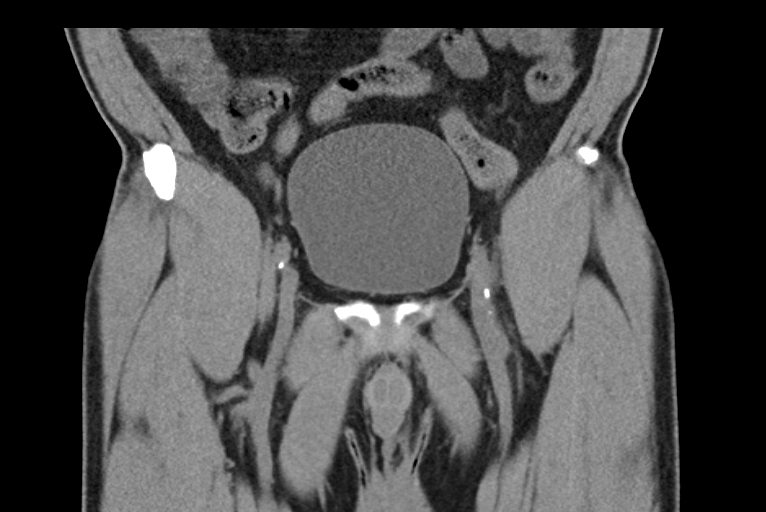
[im 34/77  soft-tissue]
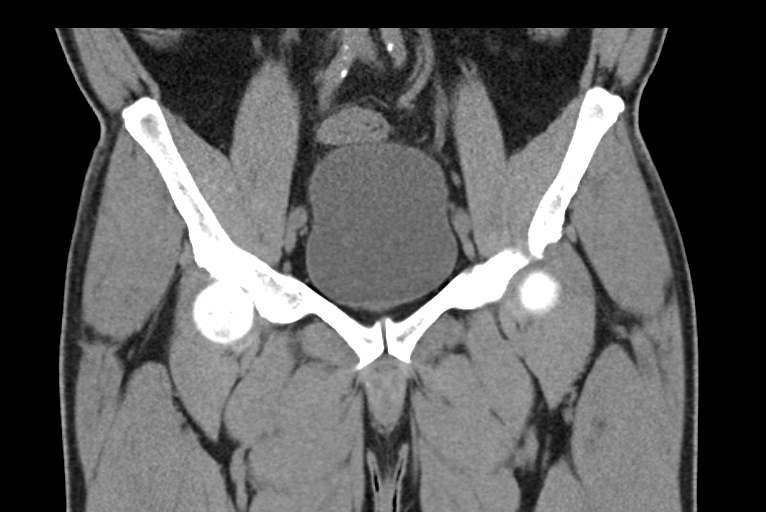
[im 43/77  soft-tissue]
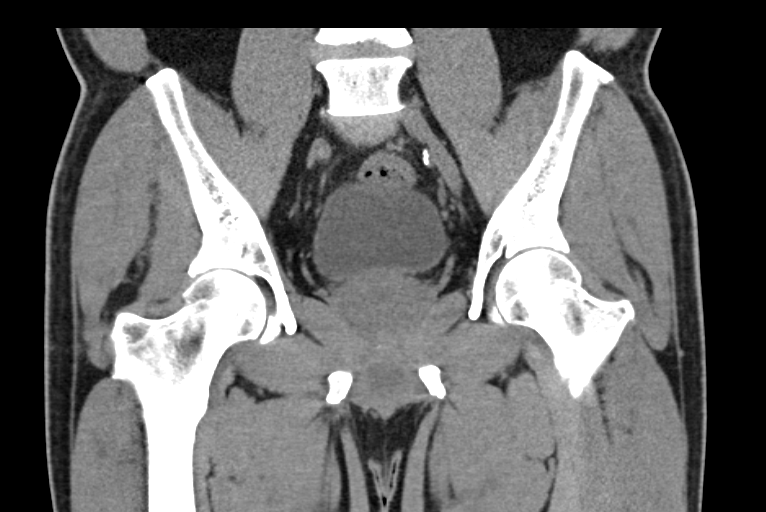

[16 of 46 positions shown; findings below may reference images not displayed]

FINDINGS: There is no mass or fluid collection in the lower abdomen or pelvis.
Urinary bladder is midline with normal wall thickness. Prostate is
slightly prominent with several small prostatic calculi present.

There is mild thickening of the wall of the rectum and distal
sigmoid colon. There is no abscess appreciable. There is no
mesenteric stranding in the perirectal or perianal regions.

Visualized bowel loops appear normal without evidence suggesting
obstruction. There is atherosclerotic change in both common and
internal iliac arteries. There are no blastic or lytic bone lesions.
There is fat in both inguinal rings, more on the right than on the
left.
IMPRESSION: There is some mild wall of the distal sigmoid colon and rectum.
There may be distal colitis and proctitis. Note, however, that no
abscess or surrounding mesenteric stranding seen.

Elsewhere, there is no mass or abscess in the pelvis. Urinary
bladder is midline. Prostate is noted to be bowel but prominent with
several prostatic calculi present. This finding may warrant
correlation with PSA.

## 2015-07-04 ENCOUNTER — Other Ambulatory Visit: Payer: Self-pay | Admitting: Cardiology

## 2015-07-04 ENCOUNTER — Other Ambulatory Visit: Payer: Self-pay | Admitting: Internal Medicine

## 2015-07-05 ENCOUNTER — Ambulatory Visit (INDEPENDENT_AMBULATORY_CARE_PROVIDER_SITE_OTHER): Payer: Managed Care, Other (non HMO) | Admitting: Internal Medicine

## 2015-07-05 ENCOUNTER — Encounter: Payer: Self-pay | Admitting: Internal Medicine

## 2015-07-05 VITALS — BP 130/80 | HR 98 | Temp 99.3°F | Resp 20 | Ht 67.5 in | Wt 183.0 lb

## 2015-07-05 DIAGNOSIS — E118 Type 2 diabetes mellitus with unspecified complications: Secondary | ICD-10-CM

## 2015-07-05 DIAGNOSIS — I10 Essential (primary) hypertension: Secondary | ICD-10-CM | POA: Diagnosis not present

## 2015-07-05 DIAGNOSIS — I255 Ischemic cardiomyopathy: Secondary | ICD-10-CM | POA: Diagnosis not present

## 2015-07-05 DIAGNOSIS — J069 Acute upper respiratory infection, unspecified: Secondary | ICD-10-CM | POA: Diagnosis not present

## 2015-07-05 DIAGNOSIS — B9789 Other viral agents as the cause of diseases classified elsewhere: Secondary | ICD-10-CM

## 2015-07-05 MED ORDER — DAPAGLIFLOZIN PROPANEDIOL 10 MG PO TABS
10.0000 mg | ORAL_TABLET | Freq: Every day | ORAL | Status: DC
Start: 2015-07-05 — End: 2015-07-19

## 2015-07-05 MED ORDER — DAPAGLIFLOZIN PROPANEDIOL 10 MG PO TABS: 10.0000 mg | ORAL_TABLET | Freq: Every day | ORAL | Status: DC

## 2015-07-05 NOTE — Progress Notes (Signed)
Pre visit review using our clinic review tool, if applicable. No additional management support is needed unless otherwise documented below in the visit note. 

## 2015-07-05 NOTE — Patient Instructions (Signed)
Acute bronchitis symptoms for less than 10 days are generally not helped by antibiotics.  Take over-the-counter expectorants and cough medications such as  Mucinex DM.  Call if there is no improvement in 5 to 7 days or if  you develop worsening cough, fever, or new symptoms, such as shortness of breath or chest pain.   Please check your hemoglobin A1c every 3 months   

## 2015-07-05 NOTE — Progress Notes (Signed)
Subjective:    Patient ID: Matthew Prince, male    DOB: [birth year], 58 y.o.   MRN: [id number]  HPI  60 year old patient who has a history of type 2 diabetes. He states that he has been off Invokana  Due to cost considerations. He has coronary artery disease and essential hypertension. He has not been seen in over one year.  He presents with a three-day history of increasing cough, congestion and drainage.   he has had low-grade fever. His cardiac status has been stable  Lab Results  Component Value Date   HGBA1C 7.1* 06/22/2014    Past Medical History  Diagnosis Date  . Hyperlipidemia   . Allergy   . Diabetes mellitus   . CAD S/P percutaneous coronary angioplasty     PCI to LAD, the DES  . HISTORY OF: ST elevation myocardial infarction (STEMI) of inferior wall 11/29/2012     99% LAD occlusion - PCI with DES  . Cardiomyopathy, ischemic - EF ~40-45% with distal Anterior - Antero& InferoApical hypokinesis. 11/29/2012    F/U Echo 02/2013: EF 50-55%. Mild anterior apical and apical lateral HK, Gr 1 DD  . Presence of drug coated stent in LAD coronary artery 11/29/2012    Xience eXpedition DES 3.0 mm x 18 mm - (3.4 mm)  . BPV (benign positional vertigo) 10/2009  . Low back pain   . Hypertension, essential     Social History   Social History  . Marital Status: Single    Spouse Name: N/A  . Number of Children: N/A  . Years of Education: N/A   Occupational History  . Not on file.   Social History Main Topics  . Smoking status: Never Smoker   . Smokeless tobacco: Never Used  . Alcohol Use: No  . Drug Use: Not on file  . Sexual Activity: Not on file   Other Topics Concern  . Not on file   Social History Narrative   He is married, lives in [place].  He works for [employer].   He is now back very active, walking several miles a day.  He is anxious to go back to work and to start cardiac rehabilitation.  He does not drink and does not smoke.    Past Surgical  History  Procedure Laterality Date  . Neck surgery      resection benign tumor  . Coronary angioplasty with stent placement  11/29/2012    99% thrombotic LAD occlusion - Xience Expedition 3.0 mm x 18 mm - post-dilated to 3.4 mm  . Transthoracic echocardiogram  03/12/2013    Post MI: Low normal EF (50-55%), apical false tendon.  Mild HK of apical anterior and apical lateral wall.  Grade 1 diastolic dysfunction.  . Left heart catheterization with coronary angiogram N/A 11/29/2012    Procedure: LEFT HEART CATHETERIZATION WITH CORONARY ANGIOGRAM;  Surgeon: Leonie Man, MD;  Location: [facility] CATH LAB;  Service: Cardiovascular;  Laterality: N/A;  . Percutaneous coronary stent intervention (pci-s) N/A 11/29/2012    Procedure: PERCUTANEOUS CORONARY STENT INTERVENTION (PCI-S);  Surgeon: Leonie Man, MD;  Location: [facility] CATH LAB;  Service: Cardiovascular;  Laterality: N/A;    Family History  Problem Relation Age of Onset  . Healthy      Noncontributory; he is not very aware of his family's history.    Allergies  Allergen Reactions  . Hydromet [Hydrocodone-Homatropine]     Lip swelling with concomitant use with tramadol  . Iodine  REACTION: unspecified  . Ultram [Tramadol]     Lip swelling with concomitant use of Hytromet    Current Outpatient Prescriptions on File Prior to Visit  Medication Sig Dispense Refill  . atorvastatin (LIPITOR) 80 MG tablet TAKE 1 TABLET BY MOUTH DAILY 90 tablet 0  . carvedilol (COREG) 12.5 MG tablet TAKE 1 TABLET BY MOUTH TWICE DAILY WITH MEALS 180 tablet 0  . clopidogrel (PLAVIX) 75 MG tablet Take 1 tablet (75 mg total) by mouth daily. 30 tablet 0  . glucose blood test strip Check glucose level before each meal  ONE TOUCH VERIO TEST STRIP 100 each 12  . isosorbide mononitrate (IMDUR) 30 MG 24 hr tablet TAKE 1 TABLET (30 MG TOTAL) BY MOUTH AT BEDTIME. NEED OV. 30 tablet 1  . lisinopril (PRINIVIL,ZESTRIL) 5 MG tablet TAKE 1 TABLET BY MOUTH DAILY 90 tablet 1  .  metFORMIN (GLUCOPHAGE) 1000 MG tablet TAKE 1 TABLET BY MOUTH 2 TIMES DAILY WITH MEALS 180 tablet 1  . nitroGLYCERIN (NITROSTAT) 0.4 MG SL tablet Place 1 tablet (0.4 mg total) under the tongue every 5 (five) minutes as needed for chest pain. 25 tablet 2   No current facility-administered medications on file prior to visit.    BP 130/80 mmHg  Pulse 98  Temp(Src) 99.3 F (37.4 C) (Oral)  Resp 20  Ht 5' 7.5" (1.715 m)  Wt 183 lb (83.008 kg)  BMI 28.22 kg/m2  SpO2 96%      Review of Systems  Constitutional: Positive for fever, activity change, appetite change and fatigue. Negative for chills.  HENT: Positive for congestion and postnasal drip. Negative for dental problem, ear pain, hearing loss, sore throat, tinnitus, trouble swallowing and voice change.   Eyes: Negative for pain, discharge and visual disturbance.  Respiratory: Positive for cough. Negative for chest tightness, wheezing and stridor.   Cardiovascular: Negative for chest pain, palpitations and leg swelling.  Gastrointestinal: Negative for nausea, vomiting, abdominal pain, diarrhea, constipation, blood in stool and abdominal distention.  Genitourinary: Negative for urgency, hematuria, flank pain, discharge, difficulty urinating and genital sores.  Musculoskeletal: Negative for myalgias, back pain, joint swelling, arthralgias, gait problem and neck stiffness.  Skin: Negative for rash.  Neurological: Negative for dizziness, syncope, speech difficulty, weakness, numbness and headaches.  Hematological: Negative for adenopathy. Does not bruise/bleed easily.  Psychiatric/Behavioral: Negative for behavioral problems and dysphoric mood. The patient is not nervous/anxious.        Objective:   Physical Exam  Constitutional: He is oriented to person, place, and time. He appears well-developed.  HENT:  Head: Normocephalic.  Right Ear: External ear normal.  Left Ear: External ear normal.  Eyes: Conjunctivae and EOM are normal.    Neck: Normal range of motion.  Cardiovascular: Normal rate and normal heart sounds.   Pulmonary/Chest: Breath sounds normal.  Abdominal: Bowel sounds are normal.  Musculoskeletal: Normal range of motion. He exhibits no edema or tenderness.  Neurological: He is alert and oriented to person, place, and time.  Psychiatric: He has a normal mood and affect. His behavior is normal.          Assessment & Plan:    Viral URI with cough.  Will treat symptomatically  Hypertension, stable  Type 2 diabetes. Will resume S TLT 2 drug.  Schedule CPX.  Check hemoglobin A1c  Coronary artery disease.  Continue statin therapy

## 2015-07-06 LAB — COMPREHENSIVE METABOLIC PANEL
ALT: 22 U/L (ref 0–53)
AST: 23 U/L (ref 0–37)
Albumin: 4.6 g/dL (ref 3.5–5.2)
Alkaline Phosphatase: 47 U/L (ref 39–117)
BILIRUBIN TOTAL: 0.4 mg/dL (ref 0.2–1.2)
BUN: 19 mg/dL (ref 6–23)
CO2: 30 mEq/L (ref 19–32)
CREATININE: 1.09 mg/dL (ref 0.40–1.50)
Calcium: 9.9 mg/dL (ref 8.4–10.5)
Chloride: 99 mEq/L (ref 96–112)
GFR: 89.44 mL/min (ref >60.00)
GLUCOSE: 249 mg/dL — AB (ref 70–99)
Potassium: 4.8 mEq/L (ref 3.5–5.1)
Sodium: 137 mEq/L (ref 135–145)
Total Protein: 7.5 g/dL (ref 6.0–8.3)

## 2015-07-06 LAB — CBC WITH DIFFERENTIAL/PLATELET
BASOS PCT: 0.3 % (ref 0.0–3.0)
Basophils Absolute: 0 10*3/uL (ref 0.0–0.1)
EOS PCT: 1.8 % (ref 0.0–5.0)
Eosinophils Absolute: 0.1 10*3/uL (ref 0.0–0.7)
HEMATOCRIT: 42 % (ref 39.0–52.0)
Hemoglobin: 13.8 g/dL (ref 13.0–17.0)
Lymphocytes Relative: 30.8 % (ref 12.0–46.0)
Lymphs Abs: 2.1 10*3/uL (ref 0.7–4.0)
MCHC: 32.8 g/dL (ref 30.0–36.0)
MCV: 88.6 fl (ref 78.0–100.0)
MONOS PCT: 13.1 % — AB (ref 3.0–12.0)
Monocytes Absolute: 0.9 10*3/uL (ref 0.1–1.0)
NEUTROS ABS: 3.6 10*3/uL (ref 1.4–7.7)
Neutrophils Relative %: 54 % (ref 43.0–77.0)
PLATELETS: 197 10*3/uL (ref 150.0–400.0)
RBC: 4.74 Mil/uL (ref 4.22–5.81)
RDW: 14.1 % (ref 11.5–15.5)
WBC: 6.7 10*3/uL (ref 4.0–10.5)

## 2015-07-06 LAB — MICROALBUMIN / CREATININE URINE RATIO
Creatinine,U: 241 mg/dL
Microalb Creat Ratio: 3.9 mg/g (ref 0.0–30.0)
Microalb, Ur: 9.4 mg/dL — ABNORMAL HIGH (ref 0.0–1.9)

## 2015-07-06 LAB — TSH: TSH: 2.18 u[IU]/mL (ref 0.35–4.50)

## 2015-07-06 LAB — LIPID PANEL
CHOL/HDL RATIO: 4
Cholesterol: 114 mg/dL (ref 0–200)
HDL: 31.3 mg/dL — ABNORMAL LOW (ref >39.00)
LDL Cholesterol: 43 mg/dL (ref 0–99)
NONHDL: 82.3
Triglycerides: 198 mg/dL — ABNORMAL HIGH (ref 0.0–149.0)
VLDL: 39.6 mg/dL (ref 0.0–40.0)

## 2015-07-06 LAB — HEMOGLOBIN A1C: Hgb A1c MFr Bld: 8.4 % — ABNORMAL HIGH (ref 4.6–6.5)

## 2015-07-09 ENCOUNTER — Other Ambulatory Visit: Payer: Self-pay | Admitting: Cardiology

## 2015-07-12 ENCOUNTER — Telehealth: Payer: Self-pay | Admitting: Cardiology

## 2015-07-12 MED ORDER — ISOSORBIDE MONONITRATE ER 30 MG PO TB24: 30.0000 mg | ORAL_TABLET | Freq: Every day | ORAL | Status: DC

## 2015-07-12 MED ORDER — CLOPIDOGREL BISULFATE 75 MG PO TABS: 75.0000 mg | ORAL_TABLET | Freq: Every day | ORAL | Status: DC

## 2015-07-12 NOTE — Telephone Encounter (Signed)
Attempt to reach pt, call goes unanswered & drops. Home line rings w/ no answer.  meds refilled for 90 day supply - pt needs to keep appt for future refills.

## 2015-07-12 NOTE — Telephone Encounter (Signed)
New Message   *STAT* If patient is at the pharmacy, call can be transferred to refill team.   1. Which medications need to be refilled? (please list name of each medication and dose if known)   clopidogrel (PLAVIX) 75 MG tablet  isosorbide mononitrate (IMDUR) 30 MG 24 hr tablet   2. Which pharmacy/location (including street and city if local pharmacy) is medication to be sent to? [facility] on delivery ( Pt received a letter that the refill was denied)   Pt called states that he has the appt for 09/07/2015 request a call back to discuss if he can get the refill for until the appt date

## 2015-07-12 NOTE — Telephone Encounter (Signed)
Rx(s) sent to pharmacy electronically.  

## 2015-07-19 ENCOUNTER — Ambulatory Visit (INDEPENDENT_AMBULATORY_CARE_PROVIDER_SITE_OTHER): Payer: Managed Care, Other (non HMO) | Admitting: Internal Medicine

## 2015-07-19 ENCOUNTER — Encounter: Payer: Self-pay | Admitting: Internal Medicine

## 2015-07-19 VITALS — BP 140/80 | HR 84 | Temp 98.6°F | Resp 20 | Ht 67.5 in | Wt 186.0 lb

## 2015-07-19 DIAGNOSIS — E1169 Type 2 diabetes mellitus with other specified complication: Secondary | ICD-10-CM

## 2015-07-19 DIAGNOSIS — I255 Ischemic cardiomyopathy: Secondary | ICD-10-CM

## 2015-07-19 DIAGNOSIS — E118 Type 2 diabetes mellitus with unspecified complications: Secondary | ICD-10-CM | POA: Diagnosis not present

## 2015-07-19 DIAGNOSIS — D126 Benign neoplasm of colon, unspecified: Secondary | ICD-10-CM

## 2015-07-19 DIAGNOSIS — Z0000 Encounter for general adult medical examination without abnormal findings: Secondary | ICD-10-CM | POA: Diagnosis not present

## 2015-07-19 DIAGNOSIS — E785 Hyperlipidemia, unspecified: Secondary | ICD-10-CM

## 2015-07-19 DIAGNOSIS — I10 Essential (primary) hypertension: Secondary | ICD-10-CM

## 2015-07-19 NOTE — Progress Notes (Signed)
Subjective:    Patient ID: Matthew Prince, male    DOB: 1955/11/30, 60 y.o.   MRN: [id number]  HPI 60 year old patient who is seen today for a preventive health examination  Lab Results  Component Value Date   HGBA1C 8.4* 07/05/2015    Presently the patient is on metformin therapy alone.  He had been treated with Onglyza in the past and more recently was placed on Farxiga the both these medications were cost prohibitive. Over the past week he has been eating much better and he states blood sugars have ranged from 1:30 to 150 His cardiac status has been stable.  He is scheduled for cardiology follow-up in April.  Past Medical History  Diagnosis Date  . Hyperlipidemia   . Allergy   . Diabetes mellitus   . CAD S/P percutaneous coronary angioplasty     PCI to LAD, the DES  . HISTORY OF: ST elevation myocardial infarction (STEMI) of inferior wall 11/29/2012     99% LAD occlusion - PCI with DES  . Cardiomyopathy, ischemic - EF ~40-45% with distal Anterior - Antero& InferoApical hypokinesis. 11/29/2012    F/U Echo 02/2013: EF 50-55%. Mild anterior apical and apical lateral HK, Gr 1 DD  . Presence of drug coated stent in LAD coronary artery 11/29/2012    Xience eXpedition DES 3.0 mm x 18 mm - (3.4 mm)  . BPV (benign positional vertigo) 10/2009  . Low back pain   . Hypertension, essential     Social History   Social History  . Marital Status: Single    Spouse Name: N/A  . Number of Children: N/A  . Years of Education: N/A   Occupational History  . Not on file.   Social History Main Topics  . Smoking status: Never Smoker   . Smokeless tobacco: Never Used  . Alcohol Use: No  . Drug Use: Not on file  . Sexual Activity: Not on file   Other Topics Concern  . Not on file   Social History Narrative   He is married, lives in [place].  He works for [employer].   He is now back very active, walking several miles a day.  He is anxious to go back to work and to start  cardiac rehabilitation.  He does not drink and does not smoke.    Past Surgical History  Procedure Laterality Date  . Neck surgery      resection benign tumor  . Coronary angioplasty with stent placement  11/29/2012    99% thrombotic LAD occlusion - Xience Expedition 3.0 mm x 18 mm - post-dilated to 3.4 mm  . Transthoracic echocardiogram  03/12/2013    Post MI: Low normal EF (50-55%), apical false tendon.  Mild HK of apical anterior and apical lateral wall.  Grade 1 diastolic dysfunction.  . Left heart catheterization with coronary angiogram N/A 11/29/2012    Procedure: LEFT HEART CATHETERIZATION WITH CORONARY ANGIOGRAM;  Surgeon: Leonie Man, MD;  Location: [facility] CATH LAB;  Service: Cardiovascular;  Laterality: N/A;  . Percutaneous coronary stent intervention (pci-s) N/A 11/29/2012    Procedure: PERCUTANEOUS CORONARY STENT INTERVENTION (PCI-S);  Surgeon: Leonie Man, MD;  Location: [facility] CATH LAB;  Service: Cardiovascular;  Laterality: N/A;    Family History  Problem Relation Age of Onset  . Healthy      Noncontributory; he is not very aware of his family's history.    Allergies  Allergen Reactions  . Hydromet [Hydrocodone-Homatropine]  Lip swelling with concomitant use with tramadol  . Iodine     REACTION: unspecified  . Ultram [Tramadol]     Lip swelling with concomitant use of Hytromet    Current Outpatient Prescriptions on File Prior to Visit  Medication Sig Dispense Refill  . atorvastatin (LIPITOR) 80 MG tablet TAKE 1 TABLET BY MOUTH DAILY 90 tablet 0  . carvedilol (COREG) 12.5 MG tablet TAKE 1 TABLET BY MOUTH TWICE DAILY WITH MEALS 180 tablet 0  . clopidogrel (PLAVIX) 75 MG tablet Take 1 tablet (75 mg total) by mouth daily. 90 tablet 0  . glucose blood test strip Check glucose level before each meal  ONE TOUCH VERIO TEST STRIP 100 each 12  . isosorbide mononitrate (IMDUR) 30 MG 24 hr tablet Take 1 tablet (30 mg total) by mouth daily. 90 tablet 0  . lisinopril  (PRINIVIL,ZESTRIL) 5 MG tablet TAKE 1 TABLET BY MOUTH DAILY 90 tablet 1  . metFORMIN (GLUCOPHAGE) 1000 MG tablet TAKE 1 TABLET BY MOUTH 2 TIMES DAILY WITH MEALS 180 tablet 1  . nitroGLYCERIN (NITROSTAT) 0.4 MG SL tablet Place 1 tablet (0.4 mg total) under the tongue every 5 (five) minutes as needed for chest pain. 25 tablet 2   No current facility-administered medications on file prior to visit.    BP 140/80 mmHg  Pulse 84  Temp(Src) 98.6 F (37 C) (Oral)  Resp 20  Ht 5' 7.5" (1.715 m)  Wt 186 lb (84.369 kg)  BMI 28.68 kg/m2  SpO2 97%      Review of Systems  Constitutional: Negative for fever, chills, activity change, appetite change and fatigue.  HENT: Negative for congestion, dental problem, ear pain, hearing loss, mouth sores, rhinorrhea, sinus pressure, sneezing, tinnitus, trouble swallowing and voice change.   Eyes: Negative for photophobia, pain, redness and visual disturbance.  Respiratory: Negative for apnea, cough, choking, chest tightness, shortness of breath and wheezing.   Cardiovascular: Negative for chest pain, palpitations and leg swelling.  Gastrointestinal: Negative for nausea, vomiting, abdominal pain, diarrhea, constipation, blood in stool, abdominal distention, anal bleeding and rectal pain.  Genitourinary: Negative for dysuria, urgency, frequency, hematuria, flank pain, decreased urine volume, discharge, penile swelling, scrotal swelling, difficulty urinating, genital sores and testicular pain.  Musculoskeletal: Negative for myalgias, back pain, joint swelling, arthralgias, gait problem, neck pain and neck stiffness.  Skin: Negative for color change, rash and wound.  Neurological: Negative for dizziness, tremors, seizures, syncope, facial asymmetry, speech difficulty, weakness, light-headedness, numbness and headaches.  Hematological: Negative for adenopathy. Does not bruise/bleed easily.  Psychiatric/Behavioral: Negative for suicidal ideas, hallucinations,  behavioral problems, confusion, sleep disturbance, self-injury, dysphoric mood, decreased concentration and agitation. The patient is not nervous/anxious.        Objective:   Physical Exam  Constitutional: He appears well-developed and well-nourished.  HENT:  Head: Normocephalic and atraumatic.  Right Ear: External ear normal.  Left Ear: External ear normal.  Nose: Nose normal.  Mouth/Throat: Oropharynx is clear and moist.  Eyes: Conjunctivae and EOM are normal. Pupils are equal, round, and reactive to light. No scleral icterus.  Neck: Normal range of motion. Neck supple. No JVD present. No thyromegaly present.  Cardiovascular: Regular rhythm, normal heart sounds and intact distal pulses.  Exam reveals no gallop and no friction rub.   No murmur heard. Pulmonary/Chest: Effort normal and breath sounds normal. He exhibits no tenderness.  Abdominal: Soft. Bowel sounds are normal. He exhibits no distension and no mass. There is no tenderness.  Genitourinary: Rectum normal, prostate  normal and penis normal. Guaiac negative stool.  Tight anal tone  Musculoskeletal: Normal range of motion. He exhibits no edema or tenderness.  Lymphadenopathy:    He has no cervical adenopathy.  Neurological: He is alert. He has normal reflexes. No cranial nerve deficit. Coordination normal.  Skin: Skin is warm and dry. No rash noted.  Psychiatric: He has a normal mood and affect. His behavior is normal.          Assessment & Plan:   Preventive health examination  Diabetes mellitus.  Recent hemoglobin A1c on metformin therapy alone 8.4.  For the past week he has enjoyed much improved glycemic control with better dietary habits.  The patient was asked to check his health plan for covered medications.  In the meantime, we'll cover with samples of Farxiga Hypertension, stable Coronary artery disease.  Follow-up cardiology  Recheck 3 months with hemoglobin A1c

## 2015-07-19 NOTE — Progress Notes (Signed)
Pre visit review using our clinic review tool, if applicable. No additional management support is needed unless otherwise documented below in the visit note. 

## 2015-07-19 NOTE — Patient Instructions (Addendum)
Limit your sodium (Salt) intake   Please check your hemoglobin A1c every 3 months    It is important that you exercise regularly, at least 20 minutes 3 to 4 times per week.  If you develop chest pain or shortness of breath seek  medical attention.  You need to lose weight.  Consider a lower calorie diet and regular exercise.  Please notify the office of covered medications for diabetes  Cardiology follow-up as planned..  Please see your eye doctor yearly to check for diabetic eye damage

## 2015-09-07 ENCOUNTER — Encounter: Payer: Self-pay | Admitting: Cardiology

## 2015-09-07 ENCOUNTER — Ambulatory Visit (INDEPENDENT_AMBULATORY_CARE_PROVIDER_SITE_OTHER): Payer: Managed Care, Other (non HMO) | Admitting: Cardiology

## 2015-09-07 VITALS — BP 130/80 | HR 81 | Ht 68.0 in | Wt 182.0 lb

## 2015-09-07 DIAGNOSIS — I2102 ST elevation (STEMI) myocardial infarction involving left anterior descending coronary artery: Secondary | ICD-10-CM | POA: Diagnosis not present

## 2015-09-07 DIAGNOSIS — E1169 Type 2 diabetes mellitus with other specified complication: Secondary | ICD-10-CM | POA: Diagnosis not present

## 2015-09-07 DIAGNOSIS — Z9861 Coronary angioplasty status: Secondary | ICD-10-CM

## 2015-09-07 DIAGNOSIS — E785 Hyperlipidemia, unspecified: Secondary | ICD-10-CM

## 2015-09-07 DIAGNOSIS — I2510 Atherosclerotic heart disease of native coronary artery without angina pectoris: Secondary | ICD-10-CM

## 2015-09-07 DIAGNOSIS — I10 Essential (primary) hypertension: Secondary | ICD-10-CM | POA: Diagnosis not present

## 2015-09-07 DIAGNOSIS — I255 Ischemic cardiomyopathy: Secondary | ICD-10-CM | POA: Diagnosis not present

## 2015-09-07 NOTE — Assessment & Plan Note (Signed)
Pretty much fully recovered, but still had some mildly reduced EF by echocardiogram. This probably has a little bit do with his exercise intolerance area wouldn't be surprised if he has a blowing diastolic dysfunction mediated symptoms. However that being said, when he was more active, he wasn't noticing them. I think he's probably more deconditioning than anything else. Is thankful every day of the fact that he was actually able to be back in the [place] when he had his heart attack. He had just returned from [place].

## 2015-09-07 NOTE — Patient Instructions (Signed)
NO CHANGES WITH CURRENT MEDICATIONS   Your physician wants you to follow-up in 12 MONTHS WITH DR HARDING. You will receive a reminder letter in the mail two months in advance. If you don't receive a letter, please call our office to schedule the follow-up appointment.   If you need a refill on your cardiac medications before your next appointment, please call your pharmacy.  

## 2015-09-07 NOTE — Assessment & Plan Note (Signed)
As blood pressure stable, will maintain current dose of lisinopril. If his pressures increase, would increase to 10 mg.

## 2015-09-07 NOTE — Progress Notes (Signed)
PCP: [name], MD  Clinic Note: Chief Complaint  Patient presents with  . Follow-up     no chest pain, no shortness of breath, no edema, no pain or cramping in legs, no lightheadedness or dizziness, yes fatigue occasionally  . Coronary Artery Disease    h/o Anterior MI - LAD PCI    HPI: Matthew Prince is a 60 y.o. male with a PMH below who presents today for ~18 month f/u for CAD-- Ant STEMI with LAD PCI in July 2014.Marland Kitchen  Lucus Copland was last seen in Nov 2015.  Recent Hospitalizations: None  Studies Reviewed: None  Interval History: Matthew Prince presents today doing relatively well. He just knows that he may get tired a little easier if he exerts himself strenuously. He is not able to do the same out of strenuous activity he used to. That being said, he has been somewhat less active this far as baseline exercise.  He cites long hours at work and being fatigued at the end of the day that keeps him from exercising. He also has not been as cognizant of his diet lately. His wife is worried about his increased abdominal girth.  Despite feeling somewhat winded with strenuous exertion, he is not had any resting or exertional chest tightness or pressure. Nothing similar to his MI related anginal pain. He has not had any heart failure symptoms of PND, orthopnea or edema.   Other review cardiac symptoms includes:  No palpitations, lightheadedness, dizziness, weakness or syncope/near syncope.  No TIA/amaurosis fugax symptoms.  No melena, hematochezia, hematuria, or epstaxis.  No claudication.  ROS: A comprehensive was performed. Review of Systems  Constitutional: Positive for malaise/fatigue. Negative for weight loss (Weight gain).  HENT: Negative for congestion and nosebleeds.   Eyes: Negative for blurred vision.  Respiratory: Negative for cough, shortness of breath and wheezing.   Cardiovascular:       Per history of present illness  Gastrointestinal: Negative for heartburn and  abdominal pain.  Musculoskeletal: Positive for joint pain. Negative for myalgias and falls.  Neurological: Negative for dizziness, speech change, focal weakness, seizures, loss of consciousness and headaches.  Psychiatric/Behavioral: Negative for memory loss. The patient is not nervous/anxious and does not have insomnia.   All other systems reviewed and are negative.   Past Medical History  Diagnosis Date  . Hyperlipidemia   . Allergy   . Diabetes mellitus   . CAD S/P percutaneous coronary angioplasty     PCI to LAD, the DES  . HISTORY OF: ST elevation myocardial infarction (STEMI) of inferior wall 11/29/2012     99% LAD occlusion - PCI with DES  . Cardiomyopathy, ischemic - EF ~40-45% with distal Anterior - Antero& InferoApical hypokinesis. 11/29/2012    F/U Echo 02/2013: EF 50-55%. Mild anterior apical and apical lateral HK, Gr 1 DD  . Presence of drug coated stent in LAD coronary artery 11/29/2012    Xience eXpedition DES 3.0 mm x 18 mm - (3.4 mm)  . BPV (benign positional vertigo) 10/2009  . Low back pain   . Hypertension, essential     Past Surgical History  Procedure Laterality Date  . Neck surgery      resection benign tumor  . Coronary angioplasty with stent placement  11/29/2012    99% thrombotic LAD occlusion - Xience Expedition 3.0 mm x 18 mm - post-dilated to 3.4 mm  . Transthoracic echocardiogram  03/12/2013    Post MI: Low normal EF (50-55%), apical false tendon.  Mild  HK of apical anterior and apical lateral wall.  Grade 1 diastolic dysfunction.  . Left heart catheterization with coronary angiogram N/A 11/29/2012    Procedure: LEFT HEART CATHETERIZATION WITH CORONARY ANGIOGRAM;  Surgeon: Leonie Man, MD;  Location: [facility] CATH LAB;  Service: Cardiovascular;  Laterality: N/A;  . Percutaneous coronary stent intervention (pci-s) N/A 11/29/2012    Procedure: PERCUTANEOUS CORONARY STENT INTERVENTION (PCI-S);  Surgeon: Leonie Man, MD;  Location: [facility] CATH LAB;  Service:  Cardiovascular;  Laterality: N/A;   Prior to Admission medications   Medication Sig Start Date End Date Taking? Authorizing Provider  atorvastatin (LIPITOR) 80 MG tablet TAKE 1 TABLET BY MOUTH DAILY 07/05/15  Yes Marletta Lor, MD  carvedilol (COREG) 12.5 MG tablet TAKE 1 TABLET BY MOUTH TWICE DAILY WITH MEALS 06/01/15  Yes Marletta Lor, MD  clopidogrel (PLAVIX) 75 MG tablet Take 1 tablet (75 mg total) by mouth daily. 07/12/15  Yes Leonie Man, MD  FARXIGA 10 MG TABS tablet Take 1 tablet by mouth daily. 08/17/15  Yes Historical Provider, MD  glucose blood test strip Check glucose level before each meal  ONE TOUCH VERIO TEST STRIP 06/04/12  Yes Marletta Lor, MD  isosorbide mononitrate (IMDUR) 30 MG 24 hr tablet Take 1 tablet (30 mg total) by mouth daily. 07/12/15  Yes Leonie Man, MD  lisinopril (PRINIVIL,ZESTRIL) 5 MG tablet TAKE 1 TABLET BY MOUTH DAILY 07/05/15  Yes Marletta Lor, MD  metFORMIN (GLUCOPHAGE) 1000 MG tablet TAKE 1 TABLET BY MOUTH 2 TIMES DAILY WITH MEALS 02/22/15  Yes Marletta Lor, MD  nitroGLYCERIN (NITROSTAT) 0.4 MG SL tablet Place 1 tablet (0.4 mg total) under the tongue every 5 (five) minutes as needed for chest pain. 12/03/12  Yes Brittainy Erie Noe, PA-C     Allergies  Allergen Reactions  . Hydromet [Hydrocodone-Homatropine]     Lip swelling with concomitant use with tramadol  . Iodine     REACTION: unspecified  . Ultram [Tramadol]     Lip swelling with concomitant use of Hytromet    Social History   Social History  . Marital Status: Single    Spouse Name: N/A  . Number of Children: N/A  . Years of Education: N/A   Social History Main Topics  . Smoking status: Never Smoker   . Smokeless tobacco: Never Used  . Alcohol Use: No  . Drug Use: None  . Sexual Activity: Not Asked   Other Topics Concern  . None   Social History Narrative   He is married, lives in .  He works for [employer].   He is  now back very active, walking several miles a day.  He is anxious to go back to work and to start cardiac rehabilitation.  He does not drink and does not smoke.   Family History  Problem Relation Age of Onset  . Healthy      Noncontributory; he is not very aware of his family's history.    Wt Readings from Last 3 Encounters:  09/07/15 182 lb (82.555 kg)  07/19/15 186 lb (84.369 kg)  07/05/15 183 lb (83.008 kg)    PHYSICAL EXAM BP 130/80 mmHg  Pulse 81  Ht 5\' 8"  (1.727 m)  Wt 182 lb (82.555 kg)  BMI 27.68 kg/m2 General appearance: alert, cooperative, appears stated age, no distress and otherwise healthy-appearing HEENT: [place]/AT, EOMI, MMM, anicteric sclera Neck: no adenopathy, no carotid bruit and no JVD Lungs: clear to auscultation  bilaterally, normal percussion bilaterally and non-labored Heart: regular rate and rhythm, S1, S2 normal, no murmur, click, rub or gallop; nondisplaced PMI Abdomen: soft, non-tender; bowel sounds normal; no masses, no organomegaly; no HJR Extremities: extremities normal, atraumatic, no cyanosis, or edema Pulses: 2+ and symmetric; Neurologic: Mental status: Alert, oriented, thought content appropriate Cranial nerves: normal (II-XII grossly intact)    Adult ECG Report  Rate: 81 ;  Rhythm: normal sinus rhythm and Normal axis, intervals and durations;   Narrative Interpretation: Normal/stable EKG   Other studies Reviewed: Additional studies/ records that were reviewed today include:  Recent Labs:  Checked by PCP.  Lab Results  Component Value Date   CHOL 114 07/05/2015   HDL 31.30* 07/05/2015   LDLCALC 43 07/05/2015   LDLDIRECT 182.0 11/23/2010   TRIG 198.0* 07/05/2015   CHOLHDL 4 07/05/2015    ASSESSMENT / PLAN: Problem List Items Addressed This Visit    Hypertension, essential (Chronic)    As blood pressure stable, will maintain current dose of lisinopril. If his pressures increase, would increase to 10 mg.      Hyperlipidemia  associated with type 2 diabetes mellitus ([place]) (Chronic)    He is still on high-dose atorvastatin. Most recent labs showed LDL calculated at 43. HDL of 31 (still not at goal) --> I encouraged him to continue exercise. As he is not having any myalgias on current dose of Lipitor, we can continue.      Relevant Medications   FARXIGA 10 MG TABS tablet   Other Relevant Orders   EKG 12-Lead   History of: STEMI (ST elevation myocardial infarction), ant wall (Chronic)    Pretty much fully recovered, but still had some mildly reduced EF by echocardiogram. This probably has a little bit do with his exercise intolerance area wouldn't be surprised if he has a blowing diastolic dysfunction mediated symptoms. However that being said, when he was more active, he wasn't noticing them. I think he's probably more deconditioning than anything else. Is thankful every day of the fact that he was actually able to be back in the [place] when he had his heart attack. He had just returned from [place].      Cardiomyopathy, ischemic - EF improved to 50 at 55% (near normal)) with distal Anterior - Antero& InferoApical hypokinesis. (Chronic)    He probably still has some diastolic dysfunction component secondary to MI with some infarcted area. Best probably wear his exertional dyspnea comes from when he overexerts. Wasn't a pretty good regimen.      Relevant Orders   EKG 12-Lead   CAD (coronary artery disease), native coronary artery - (STEMI) LAD 99% Thrombosis - PCI with Xience eXpedition 3.0 mm x `18 mm (3.4 mm post) - Primary (Chronic)    No recurrent angina. Now on Plavix alone along with high-dose statin, carvedilol and lisinopril. I think we can stop Imdur.      Relevant Orders   EKG 12-Lead      Current medicines are reviewed at length with the patient today. (+/- concerns) none The following changes have been made: n/a   Studies Ordered:   Orders Placed This Encounter  Procedures  . EKG  12-Lead    ROV 1 yr.  Leonie Man, M.D., M.S. Interventional Cardiologist   Pager # [phone] Phone # [phone] [address]. [place], [place] [address]

## 2015-09-07 NOTE — Assessment & Plan Note (Signed)
He probably still has some diastolic dysfunction component secondary to MI with some infarcted area. Best probably wear his exertional dyspnea comes from when he overexerts. Wasn't a pretty good regimen.

## 2015-09-07 NOTE — Assessment & Plan Note (Addendum)
No recurrent angina. Now on Plavix alone along with high-dose statin, carvedilol and lisinopril. I think we can stop Imdur.

## 2015-09-07 NOTE — Assessment & Plan Note (Signed)
He is still on high-dose atorvastatin. Most recent labs showed LDL calculated at 43. HDL of 31 (still not at goal) --> I encouraged him to continue exercise. As he is not having any myalgias on current dose of Lipitor, we can continue.

## 2015-09-30 ENCOUNTER — Other Ambulatory Visit: Payer: Self-pay | Admitting: Internal Medicine

## 2015-10-18 ENCOUNTER — Ambulatory Visit (INDEPENDENT_AMBULATORY_CARE_PROVIDER_SITE_OTHER): Payer: Managed Care, Other (non HMO) | Admitting: Internal Medicine

## 2015-10-18 ENCOUNTER — Encounter: Payer: Self-pay | Admitting: Internal Medicine

## 2015-10-18 VITALS — BP 122/76 | HR 68 | Temp 98.1°F | Ht 68.0 in | Wt 176.0 lb

## 2015-10-18 DIAGNOSIS — E118 Type 2 diabetes mellitus with unspecified complications: Secondary | ICD-10-CM | POA: Diagnosis not present

## 2015-10-18 DIAGNOSIS — I10 Essential (primary) hypertension: Secondary | ICD-10-CM

## 2015-10-18 DIAGNOSIS — Z9861 Coronary angioplasty status: Secondary | ICD-10-CM

## 2015-10-18 DIAGNOSIS — I2510 Atherosclerotic heart disease of native coronary artery without angina pectoris: Secondary | ICD-10-CM

## 2015-10-18 DIAGNOSIS — E1169 Type 2 diabetes mellitus with other specified complication: Secondary | ICD-10-CM | POA: Diagnosis not present

## 2015-10-18 DIAGNOSIS — E785 Hyperlipidemia, unspecified: Secondary | ICD-10-CM

## 2015-10-18 NOTE — Progress Notes (Signed)
Subjective:    Patient ID: Matthew Prince, male    DOB: 1956-02-26, 60 y.o.   MRN: [id number]  HPI  Lab Results  Component Value Date   HGBA1C 8.4* 07/05/2015   Wt Readings from Last 3 Encounters:  10/18/15 176 lb (79.833 kg)  09/07/15 182 lb (82.555 kg)  07/19/15 186 lb (84.101 kg)   60 year old patient who is seen today in follow-up.  3 months ago.  Hemoglobin A1c was poorly controlled and he was placed on Farxiga.  He has done well with some significant weight loss.  He states that blood sugars generally run less than 100.  He feels well He has coronary artery disease which has been stable.  No exertional chest pain.  He has essential hypertension and dyslipidemia.  Remains on high intensity statin therapy  Past Medical History  Diagnosis Date  . Hyperlipidemia   . Allergy   . Diabetes mellitus   . CAD S/P percutaneous coronary angioplasty     PCI to LAD, the DES  . HISTORY OF: ST elevation myocardial infarction (STEMI) of inferior wall 11/29/2012     99% LAD occlusion - PCI with DES  . Cardiomyopathy, ischemic - EF ~40-45% with distal Anterior - Antero& InferoApical hypokinesis. 11/29/2012    F/U Echo 02/2013: EF 50-55%. Mild anterior apical and apical lateral HK, Gr 1 DD  . Presence of drug coated stent in LAD coronary artery 11/29/2012    Xience eXpedition DES 3.0 mm x 18 mm - (3.4 mm)  . BPV (benign positional vertigo) 10/2009  . Low back pain   . Hypertension, essential      Social History   Social History  . Marital Status: Single    Spouse Name: N/A  . Number of Children: N/A  . Years of Education: N/A   Occupational History  . Not on file.   Social History Main Topics  . Smoking status: Never Smoker   . Smokeless tobacco: Never Used  . Alcohol Use: No  . Drug Use: Not on file  . Sexual Activity: Not on file   Other Topics Concern  . Not on file   Social History Narrative   He is married, lives in [place].  He works for [employer].   He is now back very active, walking several miles a day.  He is anxious to go back to work and to start cardiac rehabilitation.  He does not drink and does not smoke.    Past Surgical History  Procedure Laterality Date  . Neck surgery      resection benign tumor  . Coronary angioplasty with stent placement  11/29/2012    99% thrombotic LAD occlusion - Xience Expedition 3.0 mm x 18 mm - post-dilated to 3.4 mm  . Transthoracic echocardiogram  03/12/2013    Post MI: Low normal EF (50-55%), apical false tendon.  Mild HK of apical anterior and apical lateral wall.  Grade 1 diastolic dysfunction.  . Left heart catheterization with coronary angiogram N/A 11/29/2012    Procedure: LEFT HEART CATHETERIZATION WITH CORONARY ANGIOGRAM;  Surgeon: Leonie Man, MD;  Location: [facility] CATH LAB;  Service: Cardiovascular;  Laterality: N/A;  . Percutaneous coronary stent intervention (pci-s) N/A 11/29/2012    Procedure: PERCUTANEOUS CORONARY STENT INTERVENTION (PCI-S);  Surgeon: Leonie Man, MD;  Location: [facility] CATH LAB;  Service: Cardiovascular;  Laterality: N/A;    Family History  Problem Relation Age of Onset  . Healthy      Noncontributory;  he is not very aware of his family's history.    Allergies  Allergen Reactions  . Hydromet [Hydrocodone-Homatropine]     Lip swelling with concomitant use with tramadol  . Iodine     REACTION: unspecified  . Ultram [Tramadol]     Lip swelling with concomitant use of Hytromet    Current Outpatient Prescriptions on File Prior to Visit  Medication Sig Dispense Refill  . atorvastatin (LIPITOR) 80 MG tablet TAKE 1 TABLET BY MOUTH DAILY 90 tablet 0  . carvedilol (COREG) 12.5 MG tablet TAKE 1 TABLET BY MOUTH TWICE DAILY WITH MEALS 180 tablet 0  . clopidogrel (PLAVIX) 75 MG tablet Take 1 tablet (75 mg total) by mouth daily. 90 tablet 0  . FARXIGA 10 MG TABS tablet Take 1 tablet by mouth daily.  6  . glucose blood test strip Check glucose level before each meal  ONE  TOUCH VERIO TEST STRIP 100 each 12  . lisinopril (PRINIVIL,ZESTRIL) 5 MG tablet TAKE 1 TABLET BY MOUTH DAILY 90 tablet 1  . metFORMIN (GLUCOPHAGE) 1000 MG tablet TAKE 1 TABLET BY MOUTH 2 TIMES DAILY WITH MEALS 180 tablet 1  . metFORMIN (GLUCOPHAGE) 1000 MG tablet TAKE 1 TABLET BY MOUTH TWICE DAILY WITH MEALS 180 tablet 0  . nitroGLYCERIN (NITROSTAT) 0.4 MG SL tablet Place 1 tablet (0.4 mg total) under the tongue every 5 (five) minutes as needed for chest pain. 25 tablet 2   No current facility-administered medications on file prior to visit.    BP 122/76 mmHg  Pulse 68  Temp(Src) 98.1 F (36.7 C) (Oral)  Ht 5\' 8"  (1.727 m)  Wt 176 lb (79.833 kg)  BMI 26.77 kg/m2  SpO2 97%     Review of Systems  Constitutional: Negative for fever, chills, appetite change and fatigue.  HENT: Negative for congestion, dental problem, ear pain, hearing loss, sore throat, tinnitus, trouble swallowing and voice change.   Eyes: Negative for pain, discharge and visual disturbance.  Respiratory: Negative for cough, chest tightness, wheezing and stridor.   Cardiovascular: Negative for chest pain, palpitations and leg swelling.  Gastrointestinal: Negative for nausea, vomiting, abdominal pain, diarrhea, constipation, blood in stool and abdominal distention.  Genitourinary: Negative for urgency, hematuria, flank pain, discharge, difficulty urinating and genital sores.  Musculoskeletal: Negative for myalgias, back pain, joint swelling, arthralgias, gait problem and neck stiffness.  Skin: Negative for rash.  Neurological: Negative for dizziness, syncope, speech difficulty, weakness, numbness and headaches.  Hematological: Negative for adenopathy. Does not bruise/bleed easily.  Psychiatric/Behavioral: Negative for behavioral problems and dysphoric mood. The patient is not nervous/anxious.        Objective:   Physical Exam  Constitutional: He is oriented to person, place, and time. He appears well-developed.    Blood pressure well controlled  HENT:  Head: Normocephalic.  Right Ear: External ear normal.  Left Ear: External ear normal.  Eyes: Conjunctivae and EOM are normal.  Neck: Normal range of motion.  Cardiovascular: Normal rate and normal heart sounds.   Pulmonary/Chest: Breath sounds normal.  Abdominal: Bowel sounds are normal.  Musculoskeletal: Normal range of motion. He exhibits no edema or tenderness.  Neurological: He is alert and oriented to person, place, and time.  Psychiatric: He has a normal mood and affect. His behavior is normal.          Assessment & Plan:   Diabetes mellitus.  Appears to be much better controlled.  We'll check hemoglobin A1c Hypertension, stable Coronary artery disease, stable Dyslipidemia.  Continue  high intensity statin therapy  Recheck 3 months  Annual eye examination encouraged   [name], MD

## 2015-10-18 NOTE — Patient Instructions (Addendum)
Limit your sodium (Salt) intake   Please check your hemoglobin A1c every 3 months    It is important that you exercise regularly, at least 20 minutes 3 to 4 times per week.  If you develop chest pain or shortness of breath seek  medical attention.  Please see your eye doctor yearly to check for diabetic eye damage  

## 2015-10-19 LAB — HEMOGLOBIN A1C: Hgb A1c MFr Bld: 7.4 % — ABNORMAL HIGH (ref 4.6–6.5)

## 2015-11-23 ENCOUNTER — Other Ambulatory Visit: Payer: Self-pay | Admitting: Internal Medicine

## 2015-12-29 ENCOUNTER — Other Ambulatory Visit: Payer: Self-pay | Admitting: Internal Medicine

## 2015-12-29 ENCOUNTER — Other Ambulatory Visit: Payer: Self-pay | Admitting: Cardiology

## 2015-12-30 ENCOUNTER — Other Ambulatory Visit: Payer: Self-pay | Admitting: Emergency Medicine

## 2015-12-30 MED ORDER — GLUCOSE BLOOD VI STRP: ORAL_STRIP | 12 refills | Status: AC

## 2015-12-30 MED ORDER — GLUCOSE BLOOD VI STRP: ORAL_STRIP | 12 refills | Status: DC

## 2016-01-21 ENCOUNTER — Ambulatory Visit: Payer: Managed Care, Other (non HMO) | Admitting: Internal Medicine

## 2016-01-25 ENCOUNTER — Encounter: Payer: Self-pay | Admitting: Internal Medicine

## 2016-01-25 ENCOUNTER — Ambulatory Visit (INDEPENDENT_AMBULATORY_CARE_PROVIDER_SITE_OTHER): Payer: Managed Care, Other (non HMO) | Admitting: Internal Medicine

## 2016-01-25 VITALS — BP 126/82 | HR 76 | Temp 98.0°F | Resp 20 | Ht 68.0 in | Wt 179.0 lb

## 2016-01-25 DIAGNOSIS — I10 Essential (primary) hypertension: Secondary | ICD-10-CM

## 2016-01-25 DIAGNOSIS — E118 Type 2 diabetes mellitus with unspecified complications: Secondary | ICD-10-CM | POA: Diagnosis not present

## 2016-01-25 NOTE — Progress Notes (Signed)
Subjective:    Patient ID: Matthew Prince, male    DOB: 06-05-1955, 60 y.o.   MRN: [id number]  HPI   60 year old patient who is seen today in follow-up.  He has type 2 diabetes controlled with oral medications.  He states that his glycemic control has been excellent.  No new concerns or complaints  .  He states that he did have an eye examination.  2 weeks ago.    He has a history of coronary artery disease which has been stable  Past Medical History:  Diagnosis Date  . Allergy   . BPV (benign positional vertigo) 10/2009  . CAD S/P percutaneous coronary angioplasty    PCI to LAD, the DES  . Cardiomyopathy, ischemic - EF ~40-45% with distal Anterior - Antero& InferoApical hypokinesis. 11/29/2012   F/U Echo 02/2013: EF 50-55%. Mild anterior apical and apical lateral HK, Gr 1 DD  . Diabetes mellitus   . HISTORY OF: ST elevation myocardial infarction (STEMI) of inferior wall 11/29/2012    99% LAD occlusion - PCI with DES  . Hyperlipidemia   . Hypertension, essential   . Low back pain   . Presence of drug coated stent in LAD coronary artery 11/29/2012   Xience eXpedition DES 3.0 mm x 18 mm - (3.4 mm)     Social History   Social History  . Marital status: Single    Spouse name: N/A  . Number of children: N/A  . Years of education: N/A   Occupational History  . Not on file.   Social History Main Topics  . Smoking status: Never Smoker  . Smokeless tobacco: Never Used  . Alcohol use No  . Drug use: Unknown  . Sexual activity: Not on file   Other Topics Concern  . Not on file   Social History Narrative   He is married, lives in [place].  He works for [employer].   He is now back very active, walking several miles a day.  He is anxious to go back to work and to start cardiac rehabilitation.  He does not drink and does not smoke.    Past Surgical History:  Procedure Laterality Date  . CORONARY ANGIOPLASTY WITH STENT PLACEMENT  11/29/2012   99% thrombotic LAD  occlusion - Xience Expedition 3.0 mm x 18 mm - post-dilated to 3.4 mm  . LEFT HEART CATHETERIZATION WITH CORONARY ANGIOGRAM N/A 11/29/2012   Procedure: LEFT HEART CATHETERIZATION WITH CORONARY ANGIOGRAM;  Surgeon: Leonie Man, MD;  Location: [facility] CATH LAB;  Service: Cardiovascular;  Laterality: N/A;  . NECK SURGERY     resection benign tumor  . PERCUTANEOUS CORONARY STENT INTERVENTION (PCI-S) N/A 11/29/2012   Procedure: PERCUTANEOUS CORONARY STENT INTERVENTION (PCI-S);  Surgeon: Leonie Man, MD;  Location: [facility] CATH LAB;  Service: Cardiovascular;  Laterality: N/A;  . TRANSTHORACIC ECHOCARDIOGRAM  03/12/2013   Post MI: Low normal EF (50-55%), apical false tendon.  Mild HK of apical anterior and apical lateral wall.  Grade 1 diastolic dysfunction.    Family History  Problem Relation Age of Onset  . Healthy      Noncontributory; he is not very aware of his family's history.    Allergies  Allergen Reactions  . Hydromet [Hydrocodone-Homatropine]     Lip swelling with concomitant use with tramadol  . Iodine     REACTION: unspecified  . Ultram [Tramadol]     Lip swelling with concomitant use of Hytromet    Current Outpatient Prescriptions  on File Prior to Visit  Medication Sig Dispense Refill  . atorvastatin (LIPITOR) 80 MG tablet TAKE 1 TABLET BY MOUTH DAILY 90 tablet 0  . carvedilol (COREG) 12.5 MG tablet TAKE 1 TABLET BY MOUTH TWICE DAILY WITH MEALS 180 tablet 0  . clopidogrel (PLAVIX) 75 MG tablet TAKE 1 TABLET BY MOUTH DAILY 90 tablet 2  . FARXIGA 10 MG TABS tablet TAKE 1 TABLET BY MOUTH EVERY DAY 30 tablet 5  . glucose blood test strip Check glucose level before each meal ONE TOUCH VERIO TEST STRIP 100 each 12  . lisinopril (PRINIVIL,ZESTRIL) 5 MG tablet TAKE 1 TABLET BY MOUTH DAILY 90 tablet 1  . metFORMIN (GLUCOPHAGE) 1000 MG tablet TAKE 1 TABLET BY MOUTH TWICE DAILY WITH MEALS 180 tablet 0  . nitroGLYCERIN (NITROSTAT) 0.4 MG SL tablet Place 1 tablet (0.4 mg total) under the  tongue every 5 (five) minutes as needed for chest pain. 25 tablet 2   No current facility-administered medications on file prior to visit.     BP 126/82 (BP Location: Left Arm, Patient Position: Sitting, Cuff Size: Normal)   Pulse 76   Temp 98 F (36.7 C) (Oral)   Resp 20   Ht 5\' 8"  (1.727 m)   Wt 179 lb (81.2 kg)   BMI 27.22 kg/m     Review of Systems  Constitutional: Negative for appetite change, chills, fatigue and fever.  HENT: Negative for congestion, dental problem, ear pain, hearing loss, sore throat, tinnitus, trouble swallowing and voice change.   Eyes: Negative for pain, discharge and visual disturbance.  Respiratory: Negative for cough, chest tightness, wheezing and stridor.   Cardiovascular: Negative for chest pain, palpitations and leg swelling.  Gastrointestinal: Negative for abdominal distention, abdominal pain, blood in stool, constipation, diarrhea, nausea and vomiting.  Genitourinary: Negative for difficulty urinating, discharge, flank pain, genital sores, hematuria and urgency.  Musculoskeletal: Negative for arthralgias, back pain, gait problem, joint swelling, myalgias and neck stiffness.  Skin: Negative for rash.  Neurological: Negative for dizziness, syncope, speech difficulty, weakness, numbness and headaches.  Hematological: Negative for adenopathy. Does not bruise/bleed easily.  Psychiatric/Behavioral: Negative for behavioral problems and dysphoric mood. The patient is not nervous/anxious.        Objective:   Physical Exam  Constitutional: He is oriented to person, place, and time. He appears well-developed.  HENT:  Head: Normocephalic.  Right Ear: External ear normal.  Left Ear: External ear normal.  Eyes: Conjunctivae and EOM are normal.  Neck: Normal range of motion.  Cardiovascular: Normal rate and normal heart sounds.   Pulmonary/Chest: Breath sounds normal.  Abdominal: Bowel sounds are normal.  Musculoskeletal: Normal range of motion. He  exhibits no edema or tenderness.  Neurological: He is alert and oriented to person, place, and time.  Psychiatric: He has a normal mood and affect. His behavior is normal.          Assessment & Plan:   .  Diabetes mellitus.  Check hemoglobin A1c.  Continue efforts at weight loss , coronary artery disease, stable .  Essential hypertension, well-controlled  .  Follow-up 3 months  [name]

## 2016-01-25 NOTE — Patient Instructions (Signed)
Limit your sodium (Salt) intake   Please check your hemoglobin A1c every 3 months    It is important that you exercise regularly, at least 20 minutes 3 to 4 times per week.  If you develop chest pain or shortness of breath seek  medical attention.  You need to lose weight.  Consider a lower calorie diet and regular exercise. 

## 2016-01-25 NOTE — Progress Notes (Signed)
Pre visit review using our clinic review tool, if applicable. No additional management support is needed unless otherwise documented below in the visit note. 

## 2016-01-26 LAB — HEMOGLOBIN A1C: Hgb A1c MFr Bld: 6.8 % — ABNORMAL HIGH (ref 4.6–6.5)

## 2016-03-01 LAB — HM DIABETES EYE EXAM

## 2016-03-16 ENCOUNTER — Encounter: Payer: Self-pay | Admitting: Internal Medicine

## 2016-04-10 ENCOUNTER — Other Ambulatory Visit: Payer: Self-pay | Admitting: Internal Medicine

## 2016-05-01 ENCOUNTER — Encounter: Payer: Self-pay | Admitting: Internal Medicine

## 2016-05-01 ENCOUNTER — Ambulatory Visit (INDEPENDENT_AMBULATORY_CARE_PROVIDER_SITE_OTHER): Payer: Managed Care, Other (non HMO) | Admitting: Internal Medicine

## 2016-05-01 VITALS — BP 130/78 | HR 76 | Temp 98.2°F | Ht 68.0 in | Wt 180.0 lb

## 2016-05-01 DIAGNOSIS — I2510 Atherosclerotic heart disease of native coronary artery without angina pectoris: Secondary | ICD-10-CM

## 2016-05-01 DIAGNOSIS — E118 Type 2 diabetes mellitus with unspecified complications: Secondary | ICD-10-CM

## 2016-05-01 DIAGNOSIS — E1169 Type 2 diabetes mellitus with other specified complication: Secondary | ICD-10-CM | POA: Diagnosis not present

## 2016-05-01 DIAGNOSIS — I10 Essential (primary) hypertension: Secondary | ICD-10-CM | POA: Diagnosis not present

## 2016-05-01 DIAGNOSIS — E785 Hyperlipidemia, unspecified: Secondary | ICD-10-CM

## 2016-05-01 DIAGNOSIS — Z9861 Coronary angioplasty status: Secondary | ICD-10-CM

## 2016-05-01 NOTE — Patient Instructions (Addendum)
Limit your sodium (Salt) intake   Please check your hemoglobin A1c every 3 months    It is important that you exercise regularly, at least 20 minutes 3 to 4 times per week.  If you develop chest pain or shortness of breath seek  medical attention.cardio apparently not

## 2016-05-01 NOTE — Progress Notes (Signed)
Pre visit review using our clinic review tool, if applicable. No additional management support is needed unless otherwise documented below in the visit note. 

## 2016-05-01 NOTE — Progress Notes (Signed)
Subjective:    Patient ID: Matthew Prince, male    DOB: [birth year], 60 y.o.   MRN: [id number]  HPI 60 year old patient who is seen today for follow-up.  He has a history of coronary artery disease and is followed by cardiology.  He has type 2 diabetes which has been well-controlled.  He has dyslipidemia and essential hypertension. Denies any cardiopulmonary complaints He is scheduled for elective cataract extraction surgery in the near future  Diabetes is controlled on oral medications  Lab Results  Component Value Date   HGBA1C 6.8 (H) 01/25/2016   Past Medical History:  Diagnosis Date  . Allergy   . BPV (benign positional vertigo) 10/2009  . CAD S/P percutaneous coronary angioplasty    PCI to LAD, the DES  . Cardiomyopathy, ischemic - EF ~40-45% with distal Anterior - Antero& InferoApical hypokinesis. 11/29/2012   F/U Echo 02/2013: EF 50-55%. Mild anterior apical and apical lateral HK, Gr 1 DD  . Diabetes mellitus   . HISTORY OF: ST elevation myocardial infarction (STEMI) of inferior wall 11/29/2012    99% LAD occlusion - PCI with DES  . Hyperlipidemia   . Hypertension, essential   . Low back pain   . Presence of drug coated stent in LAD coronary artery 11/29/2012   Xience eXpedition DES 3.0 mm x 18 mm - (3.4 mm)     Social History   Social History  . Marital status: Single    Spouse name: N/A  . Number of children: N/A  . Years of education: N/A   Occupational History  . Not on file.   Social History Main Topics  . Smoking status: Never Smoker  . Smokeless tobacco: Never Used  . Alcohol use No  . Drug use: Unknown  . Sexual activity: Not on file   Other Topics Concern  . Not on file   Social History Narrative   He is married, lives in [place].  He works for [employer].   He is now back very active, walking several miles a day.  He is anxious to go back to work and to start cardiac rehabilitation.  He does not drink and does not smoke.     Past Surgical History:  Procedure Laterality Date  . CORONARY ANGIOPLASTY WITH STENT PLACEMENT  11/29/2012   99% thrombotic LAD occlusion - Xience Expedition 3.0 mm x 18 mm - post-dilated to 3.4 mm  . LEFT HEART CATHETERIZATION WITH CORONARY ANGIOGRAM N/A 11/29/2012   Procedure: LEFT HEART CATHETERIZATION WITH CORONARY ANGIOGRAM;  Surgeon: Leonie Man, MD;  Location[facility] LAB;  Service: Cardiovascular;  Laterality: N/A;  . NECK SURGERY     resection benign tumor  . PERCUTANEOUS CORONARY STENT INTERVENTION (PCI-S) N/A 11/29/2012   Procedure: PERCUTANEOUS CORONARY STENT INTERVENTION (PCI-S);  Surgeon: Leonie Man, MD;  Location: [facility] CATH LAB;  Service: Cardiovascular;  Laterality: N/A;  . TRANSTHORACIC ECHOCARDIOGRAM  03/12/2013   Post MI: Low normal EF (50-55%), apical false tendon.  Mild HK of apical anterior and apical lateral wall.  Grade 1 diastolic dysfunction.    Family History  Problem Relation Age of Onset  . Healthy      Noncontributory; he is not very aware of his family's history.    Allergies  Allergen Reactions  . Hydromet [Hydrocodone-Homatropine]     Lip swelling with concomitant use with tramadol  . Iodine     REACTION: unspecified  . Ultram [Tramadol]     Lip swelling with concomitant  use of Hytromet    Current Outpatient Prescriptions on File Prior to Visit  Medication Sig Dispense Refill  . atorvastatin (LIPITOR) 80 MG tablet TAKE 1 TABLET BY MOUTH DAILY 90 tablet 0  . carvedilol (COREG) 12.5 MG tablet TAKE 1 TABLET BY MOUTH TWICE DAILY WITH MEALS 180 tablet 0  . clopidogrel (PLAVIX) 75 MG tablet TAKE 1 TABLET BY MOUTH DAILY 90 tablet 2  . FARXIGA 10 MG TABS tablet TAKE 1 TABLET BY MOUTH EVERY DAY 30 tablet 5  . glucose blood test strip Check glucose level before each meal ONE TOUCH VERIO TEST STRIP 100 each 12  . lisinopril (PRINIVIL,ZESTRIL) 5 MG tablet TAKE 1 TABLET BY MOUTH DAILY 90 tablet 1  . metFORMIN (GLUCOPHAGE) 1000 MG tablet TAKE 1 TABLET  BY MOUTH TWICE DAILY WITH MEALS 180 tablet 1  . nitroGLYCERIN (NITROSTAT) 0.4 MG SL tablet Place 1 tablet (0.4 mg total) under the tongue every 5 (five) minutes as needed for chest pain. 25 tablet 2   No current facility-administered medications on file prior to visit.     BP 130/78 (BP Location: Right Arm, Patient Position: Sitting, Cuff Size: Large)   Pulse 76   Temp 98.2 F (36.8 C) (Oral)   Ht 5\' 8"  (1.727 m)   Wt 180 lb (81.6 kg)   SpO2 96%   BMI 27.37 kg/m      Review of Systems  Constitutional: Negative for appetite change, chills, fatigue and fever.  HENT: Negative for congestion, dental problem, ear pain, hearing loss, sore throat, tinnitus, trouble swallowing and voice change.   Eyes: Negative for pain, discharge and visual disturbance.  Respiratory: Negative for cough, chest tightness, wheezing and stridor.   Cardiovascular: Negative for chest pain, palpitations and leg swelling.  Gastrointestinal: Negative for abdominal distention, abdominal pain, blood in stool, constipation, diarrhea, nausea and vomiting.  Genitourinary: Negative for difficulty urinating, discharge, flank pain, genital sores, hematuria and urgency.  Musculoskeletal: Negative for arthralgias, back pain, gait problem, joint swelling, myalgias and neck stiffness.  Skin: Negative for rash.  Neurological: Negative for dizziness, syncope, speech difficulty, weakness, numbness and headaches.  Hematological: Negative for adenopathy. Does not bruise/bleed easily.  Psychiatric/Behavioral: Negative for behavioral problems and dysphoric mood. The patient is not nervous/anxious.        Objective:   Physical Exam  Constitutional: He is oriented to person, place, and time. He appears well-developed.  HENT:  Head: Normocephalic.  Right Ear: External ear normal.  Left Ear: External ear normal.  Eyes: Conjunctivae and EOM are normal.  Neck: Normal range of motion.  Cardiovascular: Normal rate and normal heart  sounds.   Pulmonary/Chest: Breath sounds normal.  Abdominal: Bowel sounds are normal.  Musculoskeletal: Normal range of motion. He exhibits no edema or tenderness.  Neurological: He is alert and oriented to person, place, and time.  Psychiatric: He has a normal mood and affect. His behavior is normal.          Assessment & Plan:   Diabetes mellitus.  Will check hemoglobin A1c Dyslipidemia.  Continue statin therapy Coronary artery disease, stable  No change in medical regimen Follow-up 3-6 months  John Williamsen Pilar Plate

## 2016-05-02 LAB — HEMOGLOBIN A1C: Hgb A1c MFr Bld: 6.8 % — ABNORMAL HIGH (ref 4.6–6.5)

## 2016-05-28 ENCOUNTER — Other Ambulatory Visit: Payer: Self-pay | Admitting: Internal Medicine

## 2016-05-30 DIAGNOSIS — H2511 Age-related nuclear cataract, right eye: Secondary | ICD-10-CM | POA: Diagnosis not present

## 2016-06-06 DIAGNOSIS — H2512 Age-related nuclear cataract, left eye: Secondary | ICD-10-CM | POA: Diagnosis not present

## 2016-07-18 ENCOUNTER — Other Ambulatory Visit: Payer: Self-pay | Admitting: Internal Medicine

## 2016-07-19 ENCOUNTER — Other Ambulatory Visit: Payer: Self-pay | Admitting: Internal Medicine

## 2016-07-19 MED ORDER — ATORVASTATIN CALCIUM 80 MG PO TABS: 80.0000 mg | ORAL_TABLET | Freq: Every day | ORAL | 1 refills | Status: DC

## 2016-07-19 MED ORDER — CARVEDILOL 12.5 MG PO TABS: 12.5000 mg | ORAL_TABLET | Freq: Two times a day (BID) | ORAL | 1 refills | Status: DC

## 2016-07-20 ENCOUNTER — Other Ambulatory Visit: Payer: Self-pay | Admitting: Internal Medicine

## 2016-07-21 ENCOUNTER — Other Ambulatory Visit: Payer: Self-pay | Admitting: Internal Medicine

## 2016-07-21 MED ORDER — METFORMIN HCL 1000 MG PO TABS: 1000.0000 mg | ORAL_TABLET | Freq: Two times a day (BID) | ORAL | 2 refills | Status: DC

## 2016-07-21 MED ORDER — ATORVASTATIN CALCIUM 80 MG PO TABS: 80.0000 mg | ORAL_TABLET | Freq: Every day | ORAL | 2 refills | Status: DC

## 2016-07-21 MED ORDER — DAPAGLIFLOZIN PROPANEDIOL 10 MG PO TABS: 10.0000 mg | ORAL_TABLET | Freq: Every day | ORAL | 2 refills | Status: DC

## 2016-07-21 MED ORDER — CARVEDILOL 12.5 MG PO TABS: 12.5000 mg | ORAL_TABLET | Freq: Two times a day (BID) | ORAL | 2 refills | Status: DC

## 2016-07-21 MED ORDER — LISINOPRIL 5 MG PO TABS: 5.0000 mg | ORAL_TABLET | Freq: Every day | ORAL | 2 refills | Status: DC

## 2016-07-21 MED ORDER — CLOPIDOGREL BISULFATE 75 MG PO TABS: 75.0000 mg | ORAL_TABLET | Freq: Every day | ORAL | 2 refills | Status: DC

## 2016-07-31 ENCOUNTER — Ambulatory Visit (INDEPENDENT_AMBULATORY_CARE_PROVIDER_SITE_OTHER): Payer: BLUE CROSS/BLUE SHIELD | Admitting: Internal Medicine

## 2016-07-31 ENCOUNTER — Encounter: Payer: Self-pay | Admitting: Internal Medicine

## 2016-07-31 VITALS — BP 130/72 | HR 79 | Temp 98.3°F | Ht 68.0 in | Wt 180.6 lb

## 2016-07-31 DIAGNOSIS — E1169 Type 2 diabetes mellitus with other specified complication: Secondary | ICD-10-CM | POA: Diagnosis not present

## 2016-07-31 DIAGNOSIS — E118 Type 2 diabetes mellitus with unspecified complications: Secondary | ICD-10-CM

## 2016-07-31 DIAGNOSIS — I2102 ST elevation (STEMI) myocardial infarction involving left anterior descending coronary artery: Secondary | ICD-10-CM | POA: Diagnosis not present

## 2016-07-31 DIAGNOSIS — E785 Hyperlipidemia, unspecified: Secondary | ICD-10-CM

## 2016-07-31 DIAGNOSIS — I10 Essential (primary) hypertension: Secondary | ICD-10-CM

## 2016-07-31 NOTE — Progress Notes (Signed)
Subjective:    Patient ID: Matthew Prince, male    DOB: 1955/10/25, 61 y.o.   MRN: [id number]  HPI 61 year old patient who is seen today for follow-up of type 2 diabetes.  He is followed closely by cardiology with coronary artery disease  Hemoglobin A1c's have been well controlled.  He checks blood sugars once or twice per week with nice glycemic control  Lab Results  Component Value Date   HGBA1C 6.8 (H) 05/01/2016    Since his last visit here, he has had cataract extraction surgery bilaterally in January.  He is quite pleased with his results He is scheduled for cardiology follow-up soon  No cardiopulmonary complaints  He remains on high intensity statin therapy  Past Medical History:  Diagnosis Date  . Allergy   . BPV (benign positional vertigo) 10/2009  . CAD S/P percutaneous coronary angioplasty    PCI to LAD, the DES  . Cardiomyopathy, ischemic - EF ~40-45% with distal Anterior - Antero& InferoApical hypokinesis. 11/29/2012   F/U Echo 02/2013: EF 50-55%. Mild anterior apical and apical lateral HK, Gr 1 DD  . Diabetes mellitus   . HISTORY OF: ST elevation myocardial infarction (STEMI) of inferior wall 11/29/2012    99% LAD occlusion - PCI with DES  . Hyperlipidemia   . Hypertension, essential   . Low back pain   . Presence of drug coated stent in LAD coronary artery 11/29/2012   Xience eXpedition DES 3.0 mm x 18 mm - (3.4 mm)     Social History   Social History  . Marital status: Single    Spouse name: N/A  . Number of children: N/A  . Years of education: N/A   Occupational History  . Not on file.   Social History Main Topics  . Smoking status: Never Smoker  . Smokeless tobacco: Never Used  . Alcohol use No  . Drug use: Unknown  . Sexual activity: Not on file   Other Topics Concern  . Not on file   Social History Narrative   He is married, lives in [place].  He works for [employer].   He is now back very active, walking several miles a  day.  He is anxious to go back to work and to start cardiac rehabilitation.  He does not drink and does not smoke.    Past Surgical History:  Procedure Laterality Date  . CORONARY ANGIOPLASTY WITH STENT PLACEMENT  11/29/2012   99% thrombotic LAD occlusion - Xience Expedition 3.0 mm x 18 mm - post-dilated to 3.4 mm  . LEFT HEART CATHETERIZATION WITH CORONARY ANGIOGRAM N/A 11/29/2012   Procedure: LEFT HEART CATHETERIZATION WITH CORONARY ANGIOGRAM;  Surgeon: Leonie Man, MD;  Location: [facility] CATH LAB;  Service: Cardiovascular;  Laterality: N/A;  . NECK SURGERY     resection benign tumor  . PERCUTANEOUS CORONARY STENT INTERVENTION (PCI-S) N/A 11/29/2012   Procedure: PERCUTANEOUS CORONARY STENT INTERVENTION (PCI-S);  Surgeon: Leonie Man, MD;  Location: [facility] CATH LAB;  Service: Cardiovascular;  Laterality: N/A;  . TRANSTHORACIC ECHOCARDIOGRAM  03/12/2013   Post MI: Low normal EF (50-55%), apical false tendon.  Mild HK of apical anterior and apical lateral wall.  Grade 1 diastolic dysfunction.    Family History  Problem Relation Age of Onset  . Healthy      Noncontributory; he is not very aware of his family's history.    Allergies  Allergen Reactions  . Hydromet [Hydrocodone-Homatropine]     Lip swelling with  concomitant use with tramadol  . Iodine     REACTION: unspecified  . Ultram [Tramadol]     Lip swelling with concomitant use of Hytromet    Current Outpatient Prescriptions on File Prior to Visit  Medication Sig Dispense Refill  . atorvastatin (LIPITOR) 80 MG tablet Take 1 tablet (80 mg total) by mouth daily. 90 tablet 2  . carvedilol (COREG) 12.5 MG tablet Take 1 tablet (12.5 mg total) by mouth 2 (two) times daily with a meal. 180 tablet 2  . clopidogrel (PLAVIX) 75 MG tablet Take 1 tablet (75 mg total) by mouth daily. 90 tablet 2  . dapagliflozin propanediol (FARXIGA) 10 MG TABS tablet Take 10 mg by mouth daily. 90 tablet 2  . glucose blood test strip Check glucose level before  each meal ONE TOUCH VERIO TEST STRIP 100 each 12  . lisinopril (PRINIVIL,ZESTRIL) 5 MG tablet Take 1 tablet (5 mg total) by mouth daily. 90 tablet 2  . metFORMIN (GLUCOPHAGE) 1000 MG tablet Take 1 tablet (1,000 mg total) by mouth 2 (two) times daily with a meal. 180 tablet 2  . nitroGLYCERIN (NITROSTAT) 0.4 MG SL tablet Place 1 tablet (0.4 mg total) under the tongue every 5 (five) minutes as needed for chest pain. 25 tablet 2   No current facility-administered medications on file prior to visit.     BP 130/72 (BP Location: Left Arm, Patient Position: Sitting, Cuff Size: Normal)   Pulse 79   Temp 98.3 F (36.8 C) (Oral)   Ht 5\' 8"  (1.727 m)   Wt 180 lb 9.6 oz (81.9 kg)   SpO2 97%   BMI 27.46 kg/m      Review of Systems  Constitutional: Negative for appetite change, chills, fatigue and fever.  HENT: Negative for congestion, dental problem, ear pain, hearing loss, sore throat, tinnitus, trouble swallowing and voice change.   Eyes: Negative for pain, discharge and visual disturbance.  Respiratory: Negative for cough, chest tightness, wheezing and stridor.   Cardiovascular: Negative for chest pain, palpitations and leg swelling.  Gastrointestinal: Negative for abdominal distention, abdominal pain, blood in stool, constipation, diarrhea, nausea and vomiting.  Genitourinary: Negative for difficulty urinating, discharge, flank pain, genital sores, hematuria and urgency.  Musculoskeletal: Negative for arthralgias, back pain, gait problem, joint swelling, myalgias and neck stiffness.  Skin: Negative for rash.  Neurological: Negative for dizziness, syncope, speech difficulty, weakness, numbness and headaches.  Hematological: Negative for adenopathy. Does not bruise/bleed easily.  Psychiatric/Behavioral: Negative for behavioral problems and dysphoric mood. The patient is not nervous/anxious.        Objective:   Physical Exam  Constitutional: He is oriented to person, place, and time. He  appears well-developed.  HENT:  Head: Normocephalic.  Right Ear: External ear normal.  Left Ear: External ear normal.  Eyes: Conjunctivae and EOM are normal.  Neck: Normal range of motion.  Cardiovascular: Normal rate and normal heart sounds.   Pulmonary/Chest: Breath sounds normal.  Abdominal: Bowel sounds are normal.  Musculoskeletal: Normal range of motion. He exhibits no edema or tenderness.  Neurological: He is alert and oriented to person, place, and time.  Psychiatric: He has a normal mood and affect. His behavior is normal.          Assessment & Plan:   Diabetes mellitus.  Will check hemoglobin A1c.  No change in therapy Essential hypertension, stable Coronary artery disease.  Follow-up cardiology Dyslipidemia.  Continue high intensity statin therapy  Recheck 3-4 months  [name]

## 2016-07-31 NOTE — Progress Notes (Signed)
Pre visit review using our clinic review tool, if applicable. No additional management support is needed unless otherwise documented below in the visit note. 

## 2016-07-31 NOTE — Patient Instructions (Signed)
Limit your sodium (Salt) intake   Please check your hemoglobin A1c every 3 months    It is important that you exercise regularly, at least 20 minutes 3 to 4 times per week.  If you develop chest pain or shortness of breath seek  medical attention.  Please check your blood pressure on a regular basis.  If it is consistently greater than 150/90, please make an office appointment.   

## 2016-08-01 LAB — LIPID PANEL
CHOL/HDL RATIO: 6
Cholesterol: 121 mg/dL (ref 0–200)
HDL: 22 mg/dL — ABNORMAL LOW (ref >39.00)
NONHDL: 99.01
TRIGLYCERIDES: 270 mg/dL — AB (ref 0.0–149.0)
VLDL: 54 mg/dL — ABNORMAL HIGH (ref 0.0–40.0)

## 2016-08-01 LAB — MICROALBUMIN / CREATININE URINE RATIO
CREATININE, U: 174.8 mg/dL
MICROALB UR: 1.6 mg/dL (ref 0.0–1.9)
MICROALB/CREAT RATIO: 0.9 mg/g (ref 0.0–30.0)

## 2016-08-01 LAB — HEMOGLOBIN A1C: Hgb A1c MFr Bld: 7 % — ABNORMAL HIGH (ref 4.6–6.5)

## 2016-08-01 LAB — LDL CHOLESTEROL, DIRECT: Direct LDL: 58 mg/dL

## 2016-09-15 ENCOUNTER — Ambulatory Visit (INDEPENDENT_AMBULATORY_CARE_PROVIDER_SITE_OTHER): Payer: BLUE CROSS/BLUE SHIELD | Admitting: Cardiology

## 2016-09-15 ENCOUNTER — Encounter: Payer: Self-pay | Admitting: Cardiology

## 2016-09-15 VITALS — BP 137/79 | HR 79 | Ht 68.0 in | Wt 179.6 lb

## 2016-09-15 DIAGNOSIS — I10 Essential (primary) hypertension: Secondary | ICD-10-CM

## 2016-09-15 DIAGNOSIS — E785 Hyperlipidemia, unspecified: Secondary | ICD-10-CM

## 2016-09-15 DIAGNOSIS — E1169 Type 2 diabetes mellitus with other specified complication: Secondary | ICD-10-CM | POA: Diagnosis not present

## 2016-09-15 DIAGNOSIS — Z9861 Coronary angioplasty status: Secondary | ICD-10-CM

## 2016-09-15 DIAGNOSIS — I2102 ST elevation (STEMI) myocardial infarction involving left anterior descending coronary artery: Secondary | ICD-10-CM | POA: Diagnosis not present

## 2016-09-15 DIAGNOSIS — I2510 Atherosclerotic heart disease of native coronary artery without angina pectoris: Secondary | ICD-10-CM

## 2016-09-15 NOTE — Progress Notes (Signed)
PCP: [name], MD  Clinic Note: Chief Complaint  Patient presents with  . Follow-up    12 months; Pt states no Sx  . Coronary Artery Disease    History of anterior STEMI with PCI    HPI: Matthew Prince is a 61 y.o. male with a PMH below who presents today for annual f/u for CAD-PCI - In setting of an anterior STEMI back in July 2014.Marland Kitchen  Jafet Curley was last seen in April 2017. He was doing very well.  Recent Hospitalizations: None  Studies Reviewed: None  Interval History: Wendy presents today overall doing very well. He is in good spirits. He notices that he would like to exercise more, but at the end of the long day of work he is really too tired to do any exercise. He is quite active with work however and denies any recurrence of his anginal chest tightness or pressure with either rest or exertion. No PND, orthopnea or edema. No rapid irregular heartbeats or palpitations. No sig. Near-syncope or TIA/amaurosis fugax.  No melena, hematochezia, hematuria, or epstaxis. No claudication.  He really seems to be in great spirits overall. He is very happy that he has recovered quite well.  ROS: A comprehensive was performed. Review of Systems  Constitutional: Negative for malaise/fatigue (Just too tired in today to exercise).  Respiratory: Negative.   Cardiovascular:       Per history of present illness  Musculoskeletal: Positive for joint pain. Negative for falls and myalgias.  Neurological: Negative for dizziness.  Endo/Heme/Allergies: Negative for environmental allergies. Does not bruise/bleed easily.  Psychiatric/Behavioral: Negative.   All other systems reviewed and are negative.   Past Medical History:  Diagnosis Date  . Allergy   . BPV (benign positional vertigo) 10/2009  . CAD S/P percutaneous coronary angioplasty    PCI to LAD, the DES  . Cardiomyopathy, ischemic - EF ~40-45% with distal Anterior - Antero& InferoApical hypokinesis. 11/29/2012   F/U Echo  02/2013: EF 50-55%. Mild anterior apical and apical lateral HK, Gr 1 DD  . Diabetes mellitus   . HISTORY OF: ST elevation myocardial infarction (STEMI) of inferior wall 11/29/2012    99% LAD occlusion - PCI with DES  . Hyperlipidemia   . Hypertension, essential   . Low back pain   . Presence of drug coated stent in LAD coronary artery 11/29/2012   Xience eXpedition DES 3.0 mm x 18 mm - (3.4 mm)    Past Surgical History:  Procedure Laterality Date  . CORONARY ANGIOPLASTY WITH STENT PLACEMENT  11/29/2012   99% thrombotic LAD occlusion - Xience Expedition 3.0 mm x 18 mm - post-dilated to 3.4 mm  . LEFT HEART CATHETERIZATION WITH CORONARY ANGIOGRAM N/A 11/29/2012   Procedure: LEFT HEART CATHETERIZATION WITH CORONARY ANGIOGRAM;  Surgeon: Leonie Man, MD;  Location: [facility] CATH LAB;  Service: Cardiovascular;  Laterality: N/A;  . NECK SURGERY     resection benign tumor  . PERCUTANEOUS CORONARY STENT INTERVENTION (PCI-S) N/A 11/29/2012   Procedure: PERCUTANEOUS CORONARY STENT INTERVENTION (PCI-S);  Surgeon: Leonie Man, MD;  Location: [facility] CATH LAB;  Service: Cardiovascular;  Laterality: N/A;  . TRANSTHORACIC ECHOCARDIOGRAM  03/12/2013   Post MI: Low normal EF (50-55%), apical false tendon.  Mild HK of apical anterior and apical lateral wall.  Grade 1 diastolic dysfunction.    Current Meds  Medication Sig  . atorvastatin (LIPITOR) 80 MG tablet Take 1 tablet (80 mg total) by mouth daily.  . carvedilol (COREG) 12.5 MG  tablet Take 1 tablet (12.5 mg total) by mouth 2 (two) times daily with a meal.  . clopidogrel (PLAVIX) 75 MG tablet Take 1 tablet (75 mg total) by mouth daily.  . dapagliflozin propanediol (FARXIGA) 10 MG TABS tablet Take 10 mg by mouth daily.  Marland Kitchen glucose blood test strip Check glucose level before each meal ONE TOUCH VERIO TEST STRIP  . lisinopril (PRINIVIL,ZESTRIL) 5 MG tablet Take 1 tablet (5 mg total) by mouth daily.  . metFORMIN (GLUCOPHAGE) 1000 MG tablet Take 1 tablet (1,000 mg  total) by mouth 2 (two) times daily with a meal.  . nitroGLYCERIN (NITROSTAT) 0.4 MG SL tablet Place 1 tablet (0.4 mg total) under the tongue every 5 (five) minutes as needed for chest pain.    Allergies  Allergen Reactions  . Hydromet [Hydrocodone-Homatropine]     Lip swelling with concomitant use with tramadol  . Iodine     REACTION: unspecified  . Ultram [Tramadol]     Lip swelling with concomitant use of Hytromet    Social History   Social History  . Marital status: Single    Spouse name: N/A  . Number of children: N/A  . Years of education: N/A   Social History Main Topics  . Smoking status: Never Smoker  . Smokeless tobacco: Never Used  . Alcohol use No  . Drug use: Unknown  . Sexual activity: Not Asked   Other Topics Concern  . None   Social History Narrative   He is married, lives in [place].  He works for [employer].   He is now back very active, walking several miles a day.  He is anxious to go back to work and to start cardiac rehabilitation.  He does not drink and does not smoke.    family history is not on file.  Wt Readings from Last 3 Encounters:  09/15/16 179 lb 9.6 oz (81.5 kg)  07/31/16 180 lb 9.6 oz (81.9 kg)  05/01/16 180 lb (81.6 kg)    PHYSICAL EXAM BP 137/79   Pulse 79   Ht 5\' 8"  (1.727 m)   Wt 179 lb 9.6 oz (81.5 kg)   BMI 27.31 kg/m  General appearance: alert, cooperative, appears stated age, no distress and Well-nourished, well-groomed. Neck: no adenopathy, no carotid bruit and no JVD Lungs: clear to auscultation bilaterally, normal percussion bilaterally and non-labored Heart: regular rate and rhythm, S1 &S2 normal, no murmur, click, rub or gallop; nondisplaced PMI Abdomen: soft, non-tender; bowel sounds normal; no masses,  no organomegaly; no HJR Extremities: extremities normal, atraumatic, no cyanosis, or edema Pulses: 2+ and symmetric;  Skin: normal, mobility and turgor normal, no evidence of bleeding or  bruising and no lesions noted or  Neurologic: Mental status: Alert& oriented x 3, thought content appropriate; pleasant mood and affect   Adult ECG Report  Rate: 79 ;  Rhythm: normal sinus rhythm and Left axis deviation/LAFB (-55 axis).;   Narrative Interpretation: Stable when compared to November 2015   Other studies Reviewed: Additional studies/ records that were reviewed today include:  Recent Labs:   Lab Results  Component Value Date   CHOL 121 07/31/2016   HDL 22.00 (L) 07/31/2016   LDLCALC 43 07/05/2015   LDLDIRECT 58.0 07/31/2016   TRIG 270.0 (H) 07/31/2016   CHOLHDL 6 07/31/2016   ASSESSMENT / PLAN: Problem List Items Addressed This Visit    CAD (coronary artery disease), native coronary artery - (STEMI) LAD 99% Thrombosis - PCI with Xience eXpedition  3.0 mm x `18 mm (3.4 mm post) (Chronic)    Doing well. No active angina symptoms. Has not required any sublingual nitroglycerin. Will refill that for him now. He is on stable dose of beta blocker and ACE inhibitor along with high-dose statin.      History of: STEMI (ST elevation myocardial infarction), ant wall - Primary (Chronic)    Minimally reduced EF by echo with anterior wall motion abnormality. He had a full recovery overall. No heart failure symptoms. No recurrent anginal symptoms.      Relevant Orders   EKG 12-Lead   Hyperlipidemia associated with type 2 diabetes mellitus ([place]) (Chronic)    Remains on high-dose atorvastatin. Main abnormality is low HDL and high triglycerides. We talked about dietary modification and the triglycerides. I don't know that we can do much of the HDL besides exercise. Can you current dose of Lipitor as long as he is not having any myalgia symptoms      Hypertension, essential (Chronic)    Borderline control today. For now will continue current medications,) think if her pressures still are elevated we may need to increase to 10 mg lisinopril.         Current medicines are reviewed  at length with the patient today. (+/- concerns) None The following changes have been made: None  Patient Instructions  Medication Instructions:  Your physician recommends that you continue on your current medications as directed. Please refer to the Current Medication list given to you today.  Labwork: NONE  Testing/Procedures: NONE  Follow-Up: Your physician wants you to follow-up in: 1 YEAR with Dr. Ellyn Hack. You will receive a reminder letter in the mail two months in advance. If you don't receive a letter, please call our office to schedule the follow-up appointment.   Any Other Special Instructions Will Be Listed Below (If Applicable).     If you need a refill on your cardiac medications before your next appointment, please call your pharmacy.     Studies Ordered:   Orders Placed This Encounter  Procedures  . EKG 12-Lead      Glenetta Hew, M.D., M.S. Interventional Cardiologist   Pager # [phone] Phone # [phone] [address] [place], [place] [address]

## 2016-09-15 NOTE — Patient Instructions (Signed)
Medication Instructions:  Your physician recommends that you continue on your current medications as directed. Please refer to the Current Medication list given to you today.  Labwork: NONE  Testing/Procedures: NONE  Follow-Up: Your physician wants you to follow-up in: 1 YEAR with Dr. Ellyn Hack. You will receive a reminder letter in the mail two months in advance. If you don't receive a letter, please call our office to schedule the follow-up appointment.   Any Other Special Instructions Will Be Listed Below (If Applicable).     If you need a refill on your cardiac medications before your next appointment, please call your pharmacy.

## 2016-09-18 ENCOUNTER — Encounter: Payer: Self-pay | Admitting: Cardiology

## 2016-09-18 NOTE — Assessment & Plan Note (Signed)
Minimally reduced EF by echo with anterior wall motion abnormality. He had a full recovery overall. No heart failure symptoms. No recurrent anginal symptoms.

## 2016-09-18 NOTE — Assessment & Plan Note (Signed)
Borderline control today. For now will continue current medications,) think if her pressures still are elevated we may need to increase to 10 mg lisinopril.

## 2016-09-18 NOTE — Assessment & Plan Note (Signed)
Remains on high-dose atorvastatin. Main abnormality is low HDL and high triglycerides. We talked about dietary modification and the triglycerides. I don't know that we can do much of the HDL besides exercise. Can you current dose of Lipitor as long as he is not having any myalgia symptoms

## 2016-09-18 NOTE — Assessment & Plan Note (Signed)
Doing well. No active angina symptoms. Has not required any sublingual nitroglycerin. Will refill that for him now. He is on stable dose of beta blocker and ACE inhibitor along with high-dose statin.

## 2016-10-20 ENCOUNTER — Other Ambulatory Visit: Payer: Self-pay | Admitting: Internal Medicine

## 2016-10-27 ENCOUNTER — Ambulatory Visit (INDEPENDENT_AMBULATORY_CARE_PROVIDER_SITE_OTHER): Payer: BLUE CROSS/BLUE SHIELD | Admitting: Internal Medicine

## 2016-10-27 ENCOUNTER — Encounter: Payer: Self-pay | Admitting: Internal Medicine

## 2016-10-27 VITALS — BP 108/80 | HR 80 | Temp 98.6°F | Wt 178.8 lb

## 2016-10-27 DIAGNOSIS — E118 Type 2 diabetes mellitus with unspecified complications: Secondary | ICD-10-CM

## 2016-10-27 DIAGNOSIS — I2510 Atherosclerotic heart disease of native coronary artery without angina pectoris: Secondary | ICD-10-CM | POA: Diagnosis not present

## 2016-10-27 DIAGNOSIS — I10 Essential (primary) hypertension: Secondary | ICD-10-CM | POA: Diagnosis not present

## 2016-10-27 DIAGNOSIS — Z9861 Coronary angioplasty status: Secondary | ICD-10-CM | POA: Diagnosis not present

## 2016-10-27 NOTE — Patient Instructions (Addendum)
WE NOW OFFER   Matthew Prince's FAST TRACK!!!  SAME DAY Appointments for ACUTE CARE  Such as: Sprains, Injuries, cuts, abrasions, rashes, muscle pain, joint pain, back pain Colds, flu, sore throats, headache, allergies, cough, fever  Ear pain, sinus and eye infections Abdominal pain, nausea, vomiting, diarrhea, upset stomach Animal/insect bites  3 Easy Ways to Schedule: Walk-In Scheduling Call in scheduling Mychart Sign-up: [email or web address]      Limit your sodium (Salt) intake   Please check your hemoglobin A1c every 3 months

## 2016-10-27 NOTE — Progress Notes (Signed)
Subjective:    Patient ID: Matthew Prince, male    DOB: 01/23/1956, 61 y.o.   MRN: [id number]  HPI  Lab Results  Component Value Date   HGBA1C 7.0 (H) 07/31/2016   61 year old patient has a history of coronary artery disease and type 2 diabetes.  He has essential hypertension. He states fasting blood sugars generally are between 80 and 90  Medical regimen includes [place].  It is unclear whether present insurance coverage will continue for this product  No cardiopulmonary complaints  Past Medical History:  Diagnosis Date  . Allergy   . BPV (benign positional vertigo) 10/2009  . CAD S/P percutaneous coronary angioplasty    PCI to LAD, the DES  . Cardiomyopathy, ischemic - EF ~40-45% with distal Anterior - Antero& InferoApical hypokinesis. 11/29/2012   F/U Echo 02/2013: EF 50-55%. Mild anterior apical and apical lateral HK, Gr 1 DD  . Diabetes mellitus   . HISTORY OF: ST elevation myocardial infarction (STEMI) of inferior wall 11/29/2012    99% LAD occlusion - PCI with DES  . Hyperlipidemia   . Hypertension, essential   . Low back pain   . Presence of drug coated stent in LAD coronary artery 11/29/2012   Xience eXpedition DES 3.0 mm x 18 mm - (3.4 mm)     Social History   Social History  . Marital status: Single    Spouse name: N/A  . Number of children: N/A  . Years of education: N/A   Occupational History  . Not on file.   Social History Main Topics  . Smoking status: Never Smoker  . Smokeless tobacco: Never Used  . Alcohol use No  . Drug use: Unknown  . Sexual activity: Not on file   Other Topics Concern  . Not on file   Social History Narrative   He is married, lives in [place].  He works for [employer].   He is now back very active, walking several miles a day.  He is anxious to go back to work and to start cardiac rehabilitation.  He does not drink and does not smoke.    Past Surgical History:  Procedure Laterality Date  . CORONARY  ANGIOPLASTY WITH STENT PLACEMENT  11/29/2012   99% thrombotic LAD occlusion - Xience Expedition 3.0 mm x 18 mm - post-dilated to 3.4 mm  . LEFT HEART CATHETERIZATION WITH CORONARY ANGIOGRAM N/A 11/29/2012   Procedure: LEFT HEART CATHETERIZATION WITH CORONARY ANGIOGRAM;  Surgeon: Leonie Man, MD;  Location: [facility] CATH LAB;  Service: Cardiovascular;  Laterality: N/A;  . NECK SURGERY     resection benign tumor  . PERCUTANEOUS CORONARY STENT INTERVENTION (PCI-S) N/A 11/29/2012   Procedure: PERCUTANEOUS CORONARY STENT INTERVENTION (PCI-S);  Surgeon: Leonie Man, MD;  Location: [facility] CATH LAB;  Service: Cardiovascular;  Laterality: N/A;  . TRANSTHORACIC ECHOCARDIOGRAM  03/12/2013   Post MI: Low normal EF (50-55%), apical false tendon.  Mild HK of apical anterior and apical lateral wall.  Grade 1 diastolic dysfunction.    Family History  Problem Relation Age of Onset  . Healthy Unknown        Noncontributory; he is not very aware of his family's history.    Allergies  Allergen Reactions  . Hydromet [Hydrocodone-Homatropine]     Lip swelling with concomitant use with tramadol  . Iodine     REACTION: unspecified  . Ultram [Tramadol]     Lip swelling with concomitant use of Hytromet    Current  Outpatient Prescriptions on File Prior to Visit  Medication Sig Dispense Refill  . atorvastatin (LIPITOR) 80 MG tablet Take 1 tablet (80 mg total) by mouth daily. 90 tablet 2  . carvedilol (COREG) 12.5 MG tablet Take 1 tablet (12.5 mg total) by mouth 2 (two) times daily with a meal. 180 tablet 2  . clopidogrel (PLAVIX) 75 MG tablet Take 1 tablet (75 mg total) by mouth daily. 90 tablet 2  . dapagliflozin propanediol (FARXIGA) 10 MG TABS tablet Take 10 mg by mouth daily. 90 tablet 2  . FARXIGA 10 MG TABS tablet TAKE 1 TABLET BY MOUTH EVERY DAY 30 tablet 1  . glucose blood test strip Check glucose level before each meal ONE TOUCH VERIO TEST STRIP 100 each 12  . lisinopril (PRINIVIL,ZESTRIL) 5 MG tablet Take  1 tablet (5 mg total) by mouth daily. 90 tablet 2  . metFORMIN (GLUCOPHAGE) 1000 MG tablet Take 1 tablet (1,000 mg total) by mouth 2 (two) times daily with a meal. 180 tablet 2  . nitroGLYCERIN (NITROSTAT) 0.4 MG SL tablet Place 1 tablet (0.4 mg total) under the tongue every 5 (five) minutes as needed for chest pain. 25 tablet 2   No current facility-administered medications on file prior to visit.     BP 108/80 (BP Location: Left Arm, Patient Position: Sitting, Cuff Size: Normal)   Pulse 80   Temp 98.6 F (37 C) (Oral)   Wt 178 lb 12.8 oz (81.1 kg)   SpO2 98%   BMI 27.19 kg/m     Review of Systems  Constitutional: Negative for appetite change, chills, fatigue and fever.  HENT: Negative for congestion, dental problem, ear pain, hearing loss, sore throat, tinnitus, trouble swallowing and voice change.   Eyes: Negative for pain, discharge and visual disturbance.  Respiratory: Negative for cough, chest tightness, wheezing and stridor.   Cardiovascular: Negative for chest pain, palpitations and leg swelling.  Gastrointestinal: Negative for abdominal distention, abdominal pain, blood in stool, constipation, diarrhea, nausea and vomiting.  Genitourinary: Negative for difficulty urinating, discharge, flank pain, genital sores, hematuria and urgency.  Musculoskeletal: Negative for arthralgias, back pain, gait problem, joint swelling, myalgias and neck stiffness.  Skin: Negative for rash.  Neurological: Negative for dizziness, syncope, speech difficulty, weakness, numbness and headaches.  Hematological: Negative for adenopathy. Does not bruise/bleed easily.  Psychiatric/Behavioral: Negative for behavioral problems and dysphoric mood. The patient is not nervous/anxious.        Objective:   Physical Exam  Constitutional: He is oriented to person, place, and time. He appears well-developed.  Blood pressure low normal  HENT:  Head: Normocephalic.  Right Ear: External ear normal.  Left  Ear: External ear normal.  Eyes: Conjunctivae and EOM are normal.  Neck: Normal range of motion.  Cardiovascular: Normal rate and normal heart sounds.   Pulmonary/Chest: Breath sounds normal.  Abdominal: Bowel sounds are normal.  Musculoskeletal: Normal range of motion. He exhibits no edema or tenderness.  Neurological: He is alert and oriented to person, place, and time.  Psychiatric: He has a normal mood and affect. His behavior is normal.          Assessment & Plan:   Diabetes mellitus.  Continue present regimen Essential hypertension, stable Coronary artery disease, stable  Follow-up 3 months  [id number],PETER Pilar Plate

## 2016-10-30 ENCOUNTER — Ambulatory Visit: Payer: BLUE CROSS/BLUE SHIELD | Admitting: Internal Medicine

## 2017-01-30 ENCOUNTER — Ambulatory Visit (INDEPENDENT_AMBULATORY_CARE_PROVIDER_SITE_OTHER): Payer: BLUE CROSS/BLUE SHIELD | Admitting: Internal Medicine

## 2017-01-30 ENCOUNTER — Encounter: Payer: Self-pay | Admitting: Internal Medicine

## 2017-01-30 VITALS — BP 132/62 | HR 70 | Temp 97.9°F | Ht 68.0 in | Wt 178.4 lb

## 2017-01-30 DIAGNOSIS — E119 Type 2 diabetes mellitus without complications: Secondary | ICD-10-CM | POA: Diagnosis not present

## 2017-01-30 DIAGNOSIS — E785 Hyperlipidemia, unspecified: Secondary | ICD-10-CM

## 2017-01-30 DIAGNOSIS — I2510 Atherosclerotic heart disease of native coronary artery without angina pectoris: Secondary | ICD-10-CM | POA: Diagnosis not present

## 2017-01-30 LAB — POCT GLYCOSYLATED HEMOGLOBIN (HGB A1C): Hemoglobin A1C: 6.8

## 2017-01-30 MED ORDER — GLIPIZIDE ER 2.5 MG PO TB24: 2.5000 mg | ORAL_TABLET | Freq: Every day | ORAL | 3 refills | Status: DC

## 2017-01-30 NOTE — Progress Notes (Signed)
Subjective:    Patient ID: Matthew Prince, male    DOB: 06-22-1955, 61 y.o.   MRN: [id number]  HPI  Lab Results  Component Value Date   HGBA1C 7.0 (H) 07/31/2016   61 year old patient who has a history of coronary artery disease and type 2 diabetes. Patient has been on a combination of farxiga and metformin therapy since February 2017. His drug benefit plan has changed and a SU is required prior to a branded product.  He has also been treated with Onglyza in the past.  Lab Results  Component Value Date   HGBA1C 6.8 01/30/2017    Past Medical History:  Diagnosis Date  . Allergy   . BPV (benign positional vertigo) 10/2009  . CAD S/P percutaneous coronary angioplasty    PCI to LAD, the DES  . Cardiomyopathy, ischemic - EF ~40-45% with distal Anterior - Antero& InferoApical hypokinesis. 11/29/2012   F/U Echo 02/2013: EF 50-55%. Mild anterior apical and apical lateral HK, Gr 1 DD  . Diabetes mellitus   . HISTORY OF: ST elevation myocardial infarction (STEMI) of inferior wall 11/29/2012    99% LAD occlusion - PCI with DES  . Hyperlipidemia   . Hypertension, essential   . Low back pain   . Presence of drug coated stent in LAD coronary artery 11/29/2012   Xience eXpedition DES 3.0 mm x 18 mm - (3.4 mm)     Social History   Social History  . Marital status: Single    Spouse name: N/A  . Number of children: N/A  . Years of education: N/A   Occupational History  . Not on file.   Social History Main Topics  . Smoking status: Never Smoker  . Smokeless tobacco: Never Used  . Alcohol use No  . Drug use: Unknown  . Sexual activity: Not on file   Other Topics Concern  . Not on file   Social History Narrative   He is married, lives in [place].  He works for [employer].   He is now back very active, walking several miles a day.  He is anxious to go back to work and to start cardiac rehabilitation.  He does not drink and does not smoke.    Past Surgical  History:  Procedure Laterality Date  . CORONARY ANGIOPLASTY WITH STENT PLACEMENT  11/29/2012   99% thrombotic LAD occlusion - Xience Expedition 3.0 mm x 18 mm - post-dilated to 3.4 mm  . LEFT HEART CATHETERIZATION WITH CORONARY ANGIOGRAM N/A 11/29/2012   Procedure: LEFT HEART CATHETERIZATION WITH CORONARY ANGIOGRAM;  Surgeon: Leonie Man, MD;  Location: [facility] CATH LAB;  Service: Cardiovascular;  Laterality: N/A;  . NECK SURGERY     resection benign tumor  . PERCUTANEOUS CORONARY STENT INTERVENTION (PCI-S) N/A 11/29/2012   Procedure: PERCUTANEOUS CORONARY STENT INTERVENTION (PCI-S);  Surgeon: Leonie Man, MD;  Location: [facility] CATH LAB;  Service: Cardiovascular;  Laterality: N/A;  . TRANSTHORACIC ECHOCARDIOGRAM  03/12/2013   Post MI: Low normal EF (50-55%), apical false tendon.  Mild HK of apical anterior and apical lateral wall.  Grade 1 diastolic dysfunction.    Family History  Problem Relation Age of Onset  . Healthy Unknown        Noncontributory; he is not very aware of his family's history.    Allergies  Allergen Reactions  . Hydromet [Hydrocodone-Homatropine]     Lip swelling with concomitant use with tramadol  . Iodine     REACTION: unspecified  .  Ultram [Tramadol]     Lip swelling with concomitant use of Hytromet    Current Outpatient Prescriptions on File Prior to Visit  Medication Sig Dispense Refill  . atorvastatin (LIPITOR) 80 MG tablet Take 1 tablet (80 mg total) by mouth daily. 90 tablet 2  . carvedilol (COREG) 12.5 MG tablet Take 1 tablet (12.5 mg total) by mouth 2 (two) times daily with a meal. 180 tablet 2  . clopidogrel (PLAVIX) 75 MG tablet Take 1 tablet (75 mg total) by mouth daily. 90 tablet 2  . dapagliflozin propanediol (FARXIGA) 10 MG TABS tablet Take 10 mg by mouth daily. 90 tablet 2  . FARXIGA 10 MG TABS tablet TAKE 1 TABLET BY MOUTH EVERY DAY 30 tablet 1  . glucose blood test strip Check glucose level before each meal ONE TOUCH VERIO TEST STRIP 100 each 12    . lisinopril (PRINIVIL,ZESTRIL) 5 MG tablet Take 1 tablet (5 mg total) by mouth daily. 90 tablet 2  . metFORMIN (GLUCOPHAGE) 1000 MG tablet Take 1 tablet (1,000 mg total) by mouth 2 (two) times daily with a meal. 180 tablet 2  . nitroGLYCERIN (NITROSTAT) 0.4 MG SL tablet Place 1 tablet (0.4 mg total) under the tongue every 5 (five) minutes as needed for chest pain. 25 tablet 2   No current facility-administered medications on file prior to visit.     BP 132/62 (BP Location: Left Arm, Patient Position: Sitting, Cuff Size: Normal)   Pulse 70   Temp 97.9 F (36.6 C) (Oral)   Ht 5\' 8"  (1.727 m)   Wt 178 lb 6.4 oz (80.9 kg)   SpO2 98%   BMI 27.13 kg/m   Wt Readings from Last 3 Encounters:  01/30/17 178 lb 6.4 oz (80.9 kg)  10/27/16 178 lb 12.8 oz (81.1 kg)  09/15/16 179 lb 9.6 oz (81.5 kg)    Review of Systems  Constitutional: Negative for appetite change, chills, fatigue and fever.  HENT: Negative for congestion, dental problem, ear pain, hearing loss, sore throat, tinnitus, trouble swallowing and voice change.   Eyes: Negative for pain, discharge and visual disturbance.  Respiratory: Negative for cough, chest tightness, wheezing and stridor.   Cardiovascular: Negative for chest pain, palpitations and leg swelling.  Gastrointestinal: Negative for abdominal distention, abdominal pain, blood in stool, constipation, diarrhea, nausea and vomiting.  Genitourinary: Negative for difficulty urinating, discharge, flank pain, genital sores, hematuria and urgency.  Musculoskeletal: Negative for arthralgias, back pain, gait problem, joint swelling, myalgias and neck stiffness.  Skin: Negative for rash.  Neurological: Negative for dizziness, syncope, speech difficulty, weakness, numbness and headaches.  Hematological: Negative for adenopathy. Does not bruise/bleed easily.  Psychiatric/Behavioral: Negative for behavioral problems and dysphoric mood. The patient is not nervous/anxious.         Objective:   Physical Exam  Constitutional: He is oriented to person, place, and time. He appears well-developed.  Blood pressure 120/70  HENT:  Head: Normocephalic.  Right Ear: External ear normal.  Left Ear: External ear normal.  Eyes: Conjunctivae and EOM are normal.  Neck: Normal range of motion.  Cardiovascular: Normal rate and normal heart sounds.   Pulmonary/Chest: Breath sounds normal.  Abdominal: Bowel sounds are normal.  Musculoskeletal: Normal range of motion. He exhibits no edema or tenderness.  Neurological: He is alert and oriented to person, place, and time.  Psychiatric: He has a normal mood and affect. His behavior is normal.          Assessment & Plan:  Diabetes mellitus area and hemoglobin A1c 6.8.  Due to lack of insurance coverage.  We'll switch to glipizide.  Recheck 3 months Coronary artery disease Dyslipidemia.  Continue statin therapy  Matthew Prince

## 2017-01-30 NOTE — Patient Instructions (Signed)
Limit your sodium (Salt) intake    It is important that you exercise regularly, at least 20 minutes 3 to 4 times per week.  If you develop chest pain or shortness of breath seek  medical attention.  Please check your blood pressure on a regular basis.  If it is consistently greater than 150/90, please make an office appointment.   Please check your hemoglobin A1c every 3 months   

## 2017-02-05 ENCOUNTER — Telehealth: Payer: Self-pay | Admitting: Internal Medicine

## 2017-02-05 MED ORDER — GLIPIZIDE ER 2.5 MG PO TB24: 2.5000 mg | ORAL_TABLET | Freq: Every day | ORAL | 3 refills | Status: DC

## 2017-02-05 NOTE — Telephone Encounter (Signed)
Medication as [facility]

## 2017-02-05 NOTE — Telephone Encounter (Signed)
Pt is calling stating that glipizide was sent to [facility] and it should be going to Pharm:  [facility] on [address]   Pt is out of Medication and would like to get it today.

## 2017-02-15 ENCOUNTER — Encounter: Payer: Self-pay | Admitting: Internal Medicine

## 2017-04-18 ENCOUNTER — Other Ambulatory Visit: Payer: Self-pay | Admitting: Internal Medicine

## 2017-05-08 ENCOUNTER — Encounter: Payer: BLUE CROSS/BLUE SHIELD | Admitting: Internal Medicine

## 2017-05-11 ENCOUNTER — Ambulatory Visit (INDEPENDENT_AMBULATORY_CARE_PROVIDER_SITE_OTHER): Payer: BLUE CROSS/BLUE SHIELD | Admitting: Internal Medicine

## 2017-05-11 ENCOUNTER — Encounter: Payer: Self-pay | Admitting: Internal Medicine

## 2017-05-11 VITALS — BP 140/68 | HR 79 | Temp 98.2°F | Ht 68.0 in | Wt 186.0 lb

## 2017-05-11 DIAGNOSIS — I10 Essential (primary) hypertension: Secondary | ICD-10-CM | POA: Diagnosis not present

## 2017-05-11 DIAGNOSIS — Z9861 Coronary angioplasty status: Secondary | ICD-10-CM

## 2017-05-11 DIAGNOSIS — Z0000 Encounter for general adult medical examination without abnormal findings: Secondary | ICD-10-CM

## 2017-05-11 DIAGNOSIS — E785 Hyperlipidemia, unspecified: Secondary | ICD-10-CM

## 2017-05-11 DIAGNOSIS — I2510 Atherosclerotic heart disease of native coronary artery without angina pectoris: Secondary | ICD-10-CM | POA: Diagnosis not present

## 2017-05-11 DIAGNOSIS — E118 Type 2 diabetes mellitus with unspecified complications: Secondary | ICD-10-CM

## 2017-05-11 DIAGNOSIS — E1169 Type 2 diabetes mellitus with other specified complication: Secondary | ICD-10-CM | POA: Diagnosis not present

## 2017-05-11 MED ORDER — LISINOPRIL 10 MG PO TABS: 10.0000 mg | ORAL_TABLET | Freq: Every day | ORAL | 6 refills | Status: DC

## 2017-05-11 MED ORDER — LISINOPRIL 10 MG PO TABS: 10.0000 mg | ORAL_TABLET | Freq: Every day | ORAL | 3 refills | Status: DC

## 2017-05-11 NOTE — Addendum Note (Signed)
Addended by: Abelardo Diesel on: 05/11/2017 04:11 PM   Modules accepted: Orders

## 2017-05-11 NOTE — Patient Instructions (Signed)
Limit your sodium (Salt) intake    It is important that you exercise regularly, at least 20 minutes 3 to 4 times per week.  If you develop chest pain or shortness of breath seek  medical attention.   Please check your hemoglobin A1c every 3-6  Months  Please check your blood pressure on a regular basis.  If it is consistently greater than 150/90, please make an office appointment.  Increase lisinopril to 10 mg daily

## 2017-05-11 NOTE — Progress Notes (Signed)
Subjective:    Patient ID: Matthew Prince, male    DOB: 11/19/1955, 61 y.o.   MRN: [id number]  HPI  61 year old patient who is seen today for a preventive health examination He has a history of coronary artery disease and is status post STEMI in the past.  He is status post PCI and remains on Plavix therapy. Management includes high intensity statin therapy.  He is doing quite well and denies any exertional chest pain or shortness of breath although fairly sedentary. Colonoscopy 2012 that revealed a hyperplastic polyp only  He has had bilateral cataract extraction surgery about 11 months ago doing well without concerns or complaints  Family history.  Both parents died in their 86s details are unknown family resides in [place] except for a brother here in the [place].  He has had 5 brothers and 4 sisters 3 deceased from unclear causes  Past Medical History:  Diagnosis Date  . Allergy   . BPV (benign positional vertigo) 10/2009  . CAD S/P percutaneous coronary angioplasty    PCI to LAD, the DES  . Cardiomyopathy, ischemic - EF ~40-45% with distal Anterior - Antero& InferoApical hypokinesis. 11/29/2012   F/U Echo 02/2013: EF 50-55%. Mild anterior apical and apical lateral HK, Gr 1 DD  . Diabetes mellitus   . HISTORY OF: ST elevation myocardial infarction (STEMI) of inferior wall 11/29/2012    99% LAD occlusion - PCI with DES  . Hyperlipidemia   . Hypertension, essential   . Low back pain   . Presence of drug coated stent in LAD coronary artery 11/29/2012   Xience eXpedition DES 3.0 mm x 18 mm - (3.4 mm)     Social History   Socioeconomic History  . Marital status: Single    Spouse name: Not on file  . Number of children: Not on file  . Years of education: Not on file  . Highest education level: Not on file  Social Needs  . Financial resource strain: Not on file  . Food insecurity - worry: Not on file  . Food insecurity - inability: Not on file  . Transportation needs - medical: Not on  file  . Transportation needs - non-medical: Not on file  Occupational History  . Not on file  Tobacco Use  . Smoking status: Never Smoker  . Smokeless tobacco: Never Used  Substance and Sexual Activity  . Alcohol use: No  . Drug use: Not on file  . Sexual activity: Not on file  Other Topics Concern  . Not on file  Social History Narrative   He is married, lives in [place].  He works for [employer].   He is now back very active, walking several miles a day.  He is anxious to go back to work and to start cardiac rehabilitation.  He does not drink and does not smoke.    Past Surgical History:  Procedure Laterality Date  . CORONARY ANGIOPLASTY WITH STENT PLACEMENT  11/29/2012   99% thrombotic LAD occlusion - Xience Expedition 3.0 mm x 18 mm - post-dilated to 3.4 mm  . LEFT HEART CATHETERIZATION WITH CORONARY ANGIOGRAM N/A 11/29/2012   Procedure: LEFT HEART CATHETERIZATION WITH CORONARY ANGIOGRAM;  Surgeon: Leonie Man, MD;  Location: [facility] CATH LAB;  Service: Cardiovascular;  Laterality: N/A;  . NECK SURGERY     resection benign tumor  . PERCUTANEOUS CORONARY STENT INTERVENTION (PCI-S) N/A 11/29/2012   Procedure: PERCUTANEOUS CORONARY STENT INTERVENTION (PCI-S);  Surgeon: Leonie Man, MD;  Location: [place] CATH LAB;  Service: Cardiovascular;  Laterality: N/A;  . TRANSTHORACIC ECHOCARDIOGRAM  03/12/2013   Post MI: Low normal EF (50-55%), apical false tendon.  Mild HK of apical anterior and apical lateral wall.  Grade 1 diastolic dysfunction.    Family History  Problem Relation Age of Onset  . Healthy Unknown        Noncontributory; he is not very aware of his family's history.    Allergies  Allergen Reactions  . Hydromet [Hydrocodone-Homatropine]     Lip swelling with concomitant use with tramadol  . Iodine     REACTION: unspecified  . Ultram [Tramadol]     Lip swelling with concomitant use of Hytromet    Current Outpatient Medications on File Prior to  Visit  Medication Sig Dispense Refill  . atorvastatin (LIPITOR) 80 MG tablet TAKE 1 TABLET (80 MG TOTAL) BY MOUTH DAILY. 90 tablet 2  . carvedilol (COREG) 12.5 MG tablet TAKE 1 TABLET (12.5 MG TOTAL) BY MOUTH 2 (TWO) TIMES DAILY WITH A MEAL. 180 tablet 2  . clopidogrel (PLAVIX) 75 MG tablet TAKE 1 TABLET (75 MG TOTAL) BY MOUTH DAILY. 90 tablet 2  . glipiZIDE (GLUCOTROL XL) 2.5 MG 24 hr tablet Take 1 tablet (2.5 mg total) by mouth daily with breakfast. 90 tablet 3  . glucose blood test strip Check glucose level before each meal ONE TOUCH VERIO TEST STRIP 100 each 12  . lisinopril (PRINIVIL,ZESTRIL) 5 MG tablet TAKE 1 TABLET (5 MG TOTAL) BY MOUTH DAILY. 90 tablet 2  . metFORMIN (GLUCOPHAGE) 1000 MG tablet TAKE 1 TABLET (1,000 MG TOTAL) BY MOUTH 2 (TWO) TIMES DAILY WITH A MEAL. 180 tablet 2  . nitroGLYCERIN (NITROSTAT) 0.4 MG SL tablet Place 1 tablet (0.4 mg total) under the tongue every 5 (five) minutes as needed for chest pain. 25 tablet 2   No current facility-administered medications on file prior to visit.     BP 140/68 (BP Location: Left Arm, Patient Position: Sitting, Cuff Size: Normal)   Pulse 79   Temp 98.2 F (36.8 C) (Oral)   Ht 5\' 8"  (1.727 m)   Wt 186 lb (84.4 kg)   SpO2 96%   BMI 28.28 kg/m     Review of Systems  Constitutional: Negative for appetite change, chills, fatigue and fever.  HENT: Negative for congestion, dental problem, ear pain, hearing loss, sore throat, tinnitus, trouble swallowing and voice change.   Eyes: Negative for pain, discharge and visual disturbance.  Respiratory: Negative for cough, chest tightness, wheezing and stridor.   Cardiovascular: Negative for chest pain, palpitations and leg swelling.  Gastrointestinal: Negative for abdominal distention, abdominal pain, blood in stool, constipation, diarrhea, nausea and vomiting.  Genitourinary: Negative for difficulty urinating, discharge, flank pain, genital sores, hematuria and urgency.    Musculoskeletal: Negative for arthralgias, back pain, gait problem, joint swelling, myalgias and neck stiffness.  Skin: Negative for rash.  Neurological: Negative for dizziness, syncope, speech difficulty, weakness, numbness and headaches.  Hematological: Negative for adenopathy. Does not bruise/bleed easily.  Psychiatric/Behavioral: Negative for behavioral problems and dysphoric mood. The patient is not nervous/anxious.        Objective:   Physical Exam  Constitutional: He appears well-developed and well-nourished.  Blood pressure 140/78  HENT:  Head: Normocephalic and atraumatic.  Right Ear: External ear normal.  Left Ear: External ear normal.  Nose: Nose normal.  Mouth/Throat: Oropharynx is clear and moist.  Arcus senilis Prominent tonsils  Eyes: Conjunctivae and EOM are normal. Pupils are  equal, round, and reactive to light. No scleral icterus.  Arcus senilis  Neck: Normal range of motion. Neck supple. No JVD present. No thyromegaly present.  Cardiovascular: Regular rhythm, normal heart sounds and intact distal pulses. Exam reveals no gallop and no friction rub.  No murmur heard. Slight decreased left dorsalis pedis pulse  Pulmonary/Chest: Effort normal and breath sounds normal. He exhibits no tenderness.  Abdominal: Soft. Bowel sounds are normal. He exhibits no distension and no mass. There is no tenderness.  Genitourinary: Prostate normal and penis normal. Rectal exam shows guaiac negative stool.  Genitourinary Comments: Slightly tender hemorrhoid  Musculoskeletal: Normal range of motion. He exhibits no edema or tenderness.  Lymphadenopathy:    He has no cervical adenopathy.  Neurological: He is alert. He has normal reflexes. No cranial nerve deficit. Coordination normal.  Skin: Skin is warm and dry. No rash noted.  Psychiatric: He has a normal mood and affect. His behavior is normal.          Assessment & Plan:   Preventive health examination.  Will check updated  lab Diabetes mellitus.  Will review hemoglobin A1c Essential hypertension.  Blood pressure borderline high.  Will increase lisinopril to 10 mg daily Coronary artery disease stable cardiology follow-up in the spring as scheduled  Return in 6 months for follow-up

## 2017-05-11 NOTE — Addendum Note (Signed)
Addended by: Elmer Picker on: 05/11/2017 03:59 PM   Modules accepted: Orders

## 2017-05-12 LAB — CBC WITH DIFFERENTIAL/PLATELET
BASOS PCT: 0.7 %
Basophils Absolute: 47 cells/uL (ref 0–200)
Eosinophils Absolute: 121 cells/uL (ref 15–500)
Eosinophils Relative: 1.8 %
HCT: 42.7 % (ref 38.5–50.0)
Hemoglobin: 13.9 g/dL (ref 13.2–17.1)
Lymphs Abs: 2881 cells/uL (ref 850–3900)
MCH: 29 pg (ref 27.0–33.0)
MCHC: 32.6 g/dL (ref 32.0–36.0)
MCV: 89 fL (ref 80.0–100.0)
MPV: 11.4 fL (ref 7.5–12.5)
Monocytes Relative: 8.8 %
NEUTROS PCT: 45.7 %
Neutro Abs: 3062 cells/uL (ref 1500–7800)
Platelets: 234 10*3/uL (ref 140–400)
RBC: 4.8 10*6/uL (ref 4.20–5.80)
RDW: 13 % (ref 11.0–15.0)
Total Lymphocyte: 43 %
WBC: 6.7 10*3/uL (ref 3.8–10.8)
WBCMIX: 590 {cells}/uL (ref 200–950)

## 2017-05-12 LAB — TSH: TSH: 1.7 m[IU]/L (ref 0.40–4.50)

## 2017-05-12 LAB — COMPREHENSIVE METABOLIC PANEL
AG RATIO: 1.9 (calc) (ref 1.0–2.5)
ALKALINE PHOSPHATASE (APISO): 49 U/L (ref 40–115)
ALT: 31 U/L (ref 9–46)
AST: 25 U/L (ref 10–35)
Albumin: 4.6 g/dL (ref 3.6–5.1)
BILIRUBIN TOTAL: 0.3 mg/dL (ref 0.2–1.2)
BUN: 18 mg/dL (ref 7–25)
CALCIUM: 9.4 mg/dL (ref 8.6–10.3)
CO2: 24 mmol/L (ref 20–32)
Chloride: 100 mmol/L (ref 98–110)
Creat: 0.93 mg/dL (ref 0.70–1.33)
Globulin: 2.4 g/dL (calc) (ref 1.9–3.7)
Glucose, Bld: 151 mg/dL — ABNORMAL HIGH (ref 65–99)
Potassium: 4.2 mmol/L (ref 3.5–5.3)
SODIUM: 138 mmol/L (ref 135–146)
Total Protein: 7 g/dL (ref 6.1–8.1)

## 2017-05-12 LAB — HEMOGLOBIN A1C
EAG (MMOL/L): 8.7 (calc)
HEMOGLOBIN A1C: 7.1 %{Hb} — AB (ref <5.7)
MEAN PLASMA GLUCOSE: 157 (calc)

## 2017-05-12 LAB — HEPATITIS C ANTIBODY
HEP C AB: NONREACTIVE
SIGNAL TO CUT-OFF: 0.02 (ref <1.00)

## 2017-11-12 ENCOUNTER — Ambulatory Visit (INDEPENDENT_AMBULATORY_CARE_PROVIDER_SITE_OTHER): Payer: BLUE CROSS/BLUE SHIELD | Admitting: Internal Medicine

## 2017-11-12 ENCOUNTER — Encounter: Payer: Self-pay | Admitting: Internal Medicine

## 2017-11-12 VITALS — BP 138/60 | Temp 98.2°F | Wt 186.0 lb

## 2017-11-12 DIAGNOSIS — Z9861 Coronary angioplasty status: Secondary | ICD-10-CM | POA: Diagnosis not present

## 2017-11-12 DIAGNOSIS — I2510 Atherosclerotic heart disease of native coronary artery without angina pectoris: Secondary | ICD-10-CM | POA: Diagnosis not present

## 2017-11-12 DIAGNOSIS — E118 Type 2 diabetes mellitus with unspecified complications: Secondary | ICD-10-CM | POA: Diagnosis not present

## 2017-11-12 LAB — POCT GLYCOSYLATED HEMOGLOBIN (HGB A1C): HEMOGLOBIN A1C: 9.8 % — AB (ref 4.0–5.6)

## 2017-11-12 MED ORDER — EMPAGLIFLOZIN 25 MG PO TABS: 25.0000 mg | ORAL_TABLET | Freq: Every day | ORAL | 6 refills | Status: DC

## 2017-11-12 MED ORDER — GLIPIZIDE ER 10 MG PO TB24: 10.0000 mg | ORAL_TABLET | Freq: Every day | ORAL | 2 refills | Status: DC

## 2017-11-12 NOTE — Patient Instructions (Signed)
Increase glipizide to 10 mg daily Continue metformin 1000 mg twice daily  Check availability of these drugs (SGLT2 blocker): Invokana    Jardiance Farxiga  Check the availability of these drugs GLP 1 Agonists):  Victoza Trulicity Bydurion Ozempic  Return in 3 months for follow-up

## 2017-11-12 NOTE — Progress Notes (Signed)
Subjective:    Patient ID: Matthew Prince, male    DOB: 1956/03/14, 62 y.o.   MRN: [id number]  HPI 62 year old patient who is seen today for follow-up of diabetes.  He has a history of coronary artery disease.  He has hypertension and remains on high intensity statin therapy In the fall due to insurance coverage, farxi was discontinued and glipizide substituted. follow-up hemoglobin A1c increased from 6.8-7.1.  Lab Results  Component Value Date   HGBA1C 9.8 (A) 11/12/2017   No home blood sugar monitoring..  No hypoglycemic symptoms  Past Medical History:  Diagnosis Date  . Allergy   . BPV (benign positional vertigo) 10/2009  . CAD S/P percutaneous coronary angioplasty    PCI to LAD, the DES  . Cardiomyopathy, ischemic - EF ~40-45% with distal Anterior - Antero& InferoApical hypokinesis. 11/29/2012   F/U Echo 02/2013: EF 50-55%. Mild anterior apical and apical lateral HK, Gr 1 DD  . Diabetes mellitus   . HISTORY OF: ST elevation myocardial infarction (STEMI) of inferior wall 11/29/2012    99% LAD occlusion - PCI with DES  . Hyperlipidemia   . Hypertension, essential   . Low back pain   . Presence of drug coated stent in LAD coronary artery 11/29/2012   Xience eXpedition DES 3.0 mm x 18 mm - (3.4 mm)     Social History   Socioeconomic History  . Marital status: Single    Spouse name: Not on file  . Number of children: Not on file  . Years of education: Not on file  . Highest education level: Not on file  Occupational History  . Not on file  Social Needs  . Financial resource strain: Not on file  . Food insecurity:    Worry: Not on file    Inability: Not on file  . Transportation needs:    Medical: Not on file    Non-medical: Not on file  Tobacco Use  . Smoking status: Never Smoker  . Smokeless tobacco: Never Used  Substance and Sexual Activity  . Alcohol use: No  . Drug use: Not on file  . Sexual activity: Not on file  Lifestyle  . Physical activity:    Days per week:  Not on file    Minutes per session: Not on file  . Stress: Not on file  Relationships  . Social connections:    Talks on phone: Not on file    Gets together: Not on file    Attends religious service: Not on file    Active member of club or organization: Not on file    Attends meetings of clubs or organizations: Not on file    Relationship status: Not on file  . Intimate partner violence:    Fear of current or ex partner: Not on file    Emotionally abused: Not on file    Physically abused: Not on file    Forced sexual activity: Not on file  Other Topics Concern  . Not on file  Social History Narrative   He is married, lives in [place]  He works for [employer].   He is now back very active, walking several miles a day.  He is anxious to go back to work and to start cardiac rehabilitation.  He does not drink and does not smoke.    Past Surgical History:  Procedure Laterality Date  . CORONARY ANGIOPLASTY WITH STENT PLACEMENT  11/29/2012   99% thrombotic LAD occlusion - Xience Expedition  3.0 mm x 18 mm - post-dilated to 3.4 mm  . LEFT HEART CATHETERIZATION WITH CORONARY ANGIOGRAM N/A 11/29/2012   Procedure: LEFT HEART CATHETERIZATION WITH CORONARY ANGIOGRAM;  Surgeon: Leonie Man, MD;  Location: [facility] CATH LAB;  Service: Cardiovascular;  Laterality: N/A;  . NECK SURGERY     resection benign tumor  . PERCUTANEOUS CORONARY STENT INTERVENTION (PCI-S) N/A 11/29/2012   Procedure: PERCUTANEOUS CORONARY STENT INTERVENTION (PCI-S);  Surgeon: Leonie Man, MD;  Location: [facility] CATH LAB;  Service: Cardiovascular;  Laterality: N/A;  . TRANSTHORACIC ECHOCARDIOGRAM  03/12/2013   Post MI: Low normal EF (50-55%), apical false tendon.  Mild HK of apical anterior and apical lateral wall.  Grade 1 diastolic dysfunction.    Family History  Problem Relation Age of Onset  . Healthy Unknown        Noncontributory; he is not very aware of his family's history.    Allergies    Allergen Reactions  . Hydromet [Hydrocodone-Homatropine]     Lip swelling with concomitant use with tramadol  . Iodine     REACTION: unspecified  . Ultram [Tramadol]     Lip swelling with concomitant use of Hytromet    Current Outpatient Medications on File Prior to Visit  Medication Sig Dispense Refill  . atorvastatin (LIPITOR) 80 MG tablet TAKE 1 TABLET (80 MG TOTAL) BY MOUTH DAILY. 90 tablet 2  . carvedilol (COREG) 12.5 MG tablet TAKE 1 TABLET (12.5 MG TOTAL) BY MOUTH 2 (TWO) TIMES DAILY WITH A MEAL. 180 tablet 2  . clopidogrel (PLAVIX) 75 MG tablet TAKE 1 TABLET (75 MG TOTAL) BY MOUTH DAILY. 90 tablet 2  . glucose blood test strip Check glucose level before each meal ONE TOUCH VERIO TEST STRIP 100 each 12  . lisinopril (PRINIVIL,ZESTRIL) 10 MG tablet Take 1 tablet (10 mg total) by mouth daily. 90 tablet 3  . metFORMIN (GLUCOPHAGE) 1000 MG tablet TAKE 1 TABLET (1,000 MG TOTAL) BY MOUTH 2 (TWO) TIMES DAILY WITH A MEAL. 180 tablet 2  . nitroGLYCERIN (NITROSTAT) 0.4 MG SL tablet Place 1 tablet (0.4 mg total) under the tongue every 5 (five) minutes as needed for chest pain. 25 tablet 2   No current facility-administered medications on file prior to visit.     BP 138/60 (BP Location: Left Arm, Patient Position: Sitting, Cuff Size: Large)   Temp 98.2 F (36.8 C) (Oral)   Wt 186 lb (84.4 kg)   BMI 28.28 kg/m      Review of Systems  Constitutional: Negative for appetite change, chills, fatigue and fever.  HENT: Negative for congestion, dental problem, ear pain, hearing loss, sore throat, tinnitus, trouble swallowing and voice change.   Eyes: Negative for pain, discharge and visual disturbance.  Respiratory: Negative for cough, chest tightness, wheezing and stridor.   Cardiovascular: Negative for chest pain, palpitations and leg swelling.  Gastrointestinal: Negative for abdominal distention, abdominal pain, blood in stool, constipation, diarrhea, nausea and vomiting.  Endocrine:  Negative for polydipsia, polyphagia and polyuria.  Genitourinary: Negative for difficulty urinating, discharge, flank pain, genital sores, hematuria and urgency.  Musculoskeletal: Negative for arthralgias, back pain, gait problem, joint swelling, myalgias and neck stiffness.  Skin: Negative for rash.  Neurological: Negative for dizziness, syncope, speech difficulty, weakness, numbness and headaches.  Hematological: Negative for adenopathy. Does not bruise/bleed easily.  Psychiatric/Behavioral: Negative for behavioral problems and dysphoric mood. The patient is not nervous/anxious.        Objective:   Physical Exam  Constitutional: He is  oriented to person, place, and time. He appears well-developed.  Blood pressure 120/70  HENT:  Head: Normocephalic.  Right Ear: External ear normal.  Left Ear: External ear normal.  Eyes: Conjunctivae and EOM are normal.  Neck: Normal range of motion.  Cardiovascular: Normal rate and normal heart sounds.  Pulmonary/Chest: Breath sounds normal.  Abdominal: Bowel sounds are normal.  Musculoskeletal: Normal range of motion. He exhibits no edema or tenderness.  Neurological: He is alert and oriented to person, place, and time.  Psychiatric: He has a normal mood and affect. His behavior is normal.          Assessment & Plan:   Diabetes mellitus.  Poor control.  Hemoglobin A1c up to 9.8.  Will increase glipizide to 10 mg daily.  Continue metformin 1000 mg twice daily The patient has been asked to check with his insurance plan concerning all GLP-1 agonist as well as SGLT2 drugs  The patient was given a new prescription for Jardiance (samples Farxiga for 2 weeks ) Was given 2-week supply of Trulicity.  Patient will notify insurance coverage Repeat hemoglobin A1c in 3 months  Hopefully we can control diabetes with a regimen of metformin, GLP-1 and SGLT2 drug and discontinue glipizide    Marletta Lor

## 2018-01-26 ENCOUNTER — Other Ambulatory Visit: Payer: Self-pay | Admitting: Internal Medicine

## 2018-01-30 ENCOUNTER — Encounter: Payer: Self-pay | Admitting: Internal Medicine

## 2018-01-30 ENCOUNTER — Ambulatory Visit (INDEPENDENT_AMBULATORY_CARE_PROVIDER_SITE_OTHER): Payer: BLUE CROSS/BLUE SHIELD | Admitting: Internal Medicine

## 2018-01-30 VITALS — BP 120/80 | HR 73 | Temp 98.1°F | Wt 185.2 lb

## 2018-01-30 DIAGNOSIS — Z9861 Coronary angioplasty status: Secondary | ICD-10-CM

## 2018-01-30 DIAGNOSIS — E118 Type 2 diabetes mellitus with unspecified complications: Secondary | ICD-10-CM | POA: Diagnosis not present

## 2018-01-30 DIAGNOSIS — I10 Essential (primary) hypertension: Secondary | ICD-10-CM | POA: Diagnosis not present

## 2018-01-30 DIAGNOSIS — I2510 Atherosclerotic heart disease of native coronary artery without angina pectoris: Secondary | ICD-10-CM

## 2018-01-30 LAB — POCT GLYCOSYLATED HEMOGLOBIN (HGB A1C): Hemoglobin A1C: 6.7 % — AB (ref 4.0–5.6)

## 2018-01-30 NOTE — Progress Notes (Signed)
Subjective:    Patient ID: Matthew Prince, male    DOB: [birth year], 62 y.o.   MRN: [id number]  HPI  62 year old patient who is seen today in follow-up.  Due to lack of insurance coverage the patient had been off some medications 3 months ago with a hemoglobin A1c up to 9.8.  He now has coverage with Jardiance as well as glipizide and metformin and hemoglobin A1c today has improved to 6.7.  He generally feels well He does have a history of coronary artery disease which has been stable.  He has essential hypertension which has been controlled.  No cardiopulmonary complaints  No recent eye examination  Past Medical History:  Diagnosis Date  . Allergy   . BPV (benign positional vertigo) 10/2009  . CAD S/P percutaneous coronary angioplasty    PCI to LAD, the DES  . Cardiomyopathy, ischemic - EF ~40-45% with distal Anterior - Antero& InferoApical hypokinesis. 11/29/2012   F/U Echo 02/2013: EF 50-55%. Mild anterior apical and apical lateral HK, Gr 1 DD  . Diabetes mellitus   . HISTORY OF: ST elevation myocardial infarction (STEMI) of inferior wall 11/29/2012    99% LAD occlusion - PCI with DES  . Hyperlipidemia   . Hypertension, essential   . Low back pain   . Presence of drug coated stent in LAD coronary artery 11/29/2012   Xience eXpedition DES 3.0 mm x 18 mm - (3.4 mm)     Social History   Socioeconomic History  . Marital status: Single    Spouse name: Not on file  . Number of children: Not on file  . Years of education: Not on file  . Highest education level: Not on file  Occupational History  . Not on file  Social Needs  . Financial resource strain: Not on file  . Food insecurity:    Worry: Not on file    Inability: Not on file  . Transportation needs:    Medical: Not on file    Non-medical: Not on file  Tobacco Use  . Smoking status: Never Smoker  . Smokeless tobacco: Never Used  Substance and Sexual Activity  . Alcohol use: No  . Drug use: Not on file  . Sexual activity:  Not on file  Lifestyle  . Physical activity:    Days per week: Not on file    Minutes per session: Not on file  . Stress: Not on file  Relationships  . Social connections:    Talks on phone: Not on file    Gets together: Not on file    Attends religious service: Not on file    Active member of club or organization: Not on file    Attends meetings of clubs or organizations: Not on file    Relationship status: Not on file  . Intimate partner violence:    Fear of current or ex partner: Not on file    Emotionally abused: Not on file    Physically abused: Not on file    Forced sexual activity: Not on file  Other Topics Concern  . Not on file  Social History Narrative   He is married, lives in [place].  He works for [employer].   He is now back very active, walking several miles a day.  He is anxious to go back to work and to start cardiac rehabilitation.  He does not drink and does not smoke.    Past Surgical History:  Procedure Laterality Date  .  CORONARY ANGIOPLASTY WITH STENT PLACEMENT  11/29/2012   99% thrombotic LAD occlusion - Xience Expedition 3.0 mm x 18 mm - post-dilated to 3.4 mm  . LEFT HEART CATHETERIZATION WITH CORONARY ANGIOGRAM N/A 11/29/2012   Procedure: LEFT HEART CATHETERIZATION WITH CORONARY ANGIOGRAM;  Surgeon: Leonie Man, MD;  Location: [facility] CATH LAB;  Service: Cardiovascular;  Laterality: N/A;  . NECK SURGERY     resection benign tumor  . PERCUTANEOUS CORONARY STENT INTERVENTION (PCI-S) N/A 11/29/2012   Procedure: PERCUTANEOUS CORONARY STENT INTERVENTION (PCI-S);  Surgeon: Leonie Man, MD;  Location: [facility] CATH LAB;  Service: Cardiovascular;  Laterality: N/A;  . TRANSTHORACIC ECHOCARDIOGRAM  03/12/2013   Post MI: Low normal EF (50-55%), apical false tendon.  Mild HK of apical anterior and apical lateral wall.  Grade 1 diastolic dysfunction.    Family History  Problem Relation Age of Onset  . Healthy Unknown        Noncontributory; he is  not very aware of his family's history.    Allergies  Allergen Reactions  . Hydromet [Hydrocodone-Homatropine]     Lip swelling with concomitant use with tramadol  . Iodine     REACTION: unspecified  . Ultram [Tramadol]     Lip swelling with concomitant use of Hytromet    Current Outpatient Medications on File Prior to Visit  Medication Sig Dispense Refill  . atorvastatin (LIPITOR) 80 MG tablet TAKE 1 TABLET (80 MG TOTAL) BY MOUTH DAILY. 90 tablet 2  . carvedilol (COREG) 12.5 MG tablet TAKE 1 TABLET (12.5 MG TOTAL) BY MOUTH 2 (TWO) TIMES DAILY WITH A MEAL. 180 tablet 2  . clopidogrel (PLAVIX) 75 MG tablet TAKE 1 TABLET (75 MG TOTAL) BY MOUTH DAILY. 90 tablet 2  . empagliflozin (JARDIANCE) 25 MG TABS tablet Take 25 mg by mouth daily. 30 tablet 6  . glipiZIDE (GLUCOTROL XL) 10 MG 24 hr tablet Take 1 tablet (10 mg total) by mouth daily with breakfast. 90 tablet 2  . glucose blood test strip Check glucose level before each meal ONE TOUCH VERIO TEST STRIP 100 each 12  . lisinopril (PRINIVIL,ZESTRIL) 10 MG tablet Take 1 tablet (10 mg total) by mouth daily. 90 tablet 3  . metFORMIN (GLUCOPHAGE) 1000 MG tablet TAKE 1 TABLET (1,000 MG TOTAL) BY MOUTH 2 (TWO) TIMES DAILY WITH A MEAL. 180 tablet 2  . nitroGLYCERIN (NITROSTAT) 0.4 MG SL tablet Place 1 tablet (0.4 mg total) under the tongue every 5 (five) minutes as needed for chest pain. 25 tablet 2   No current facility-administered medications on file prior to visit.     BP 120/80 (BP Location: Left Arm, Patient Position: Sitting, Cuff Size: Normal)   Pulse 73   Temp 98.1 F (36.7 C) (Oral)   Wt 185 lb 3.2 oz (84 kg)   SpO2 96%   BMI 28.16 kg/m     Review of Systems  Constitutional: Negative for appetite change, chills, fatigue and fever.  HENT: Negative for congestion, dental problem, ear pain, hearing loss, sore throat, tinnitus, trouble swallowing and voice change.   Eyes: Negative for pain, discharge and visual disturbance.    Respiratory: Negative for cough, chest tightness, wheezing and stridor.   Cardiovascular: Negative for chest pain, palpitations and leg swelling.  Gastrointestinal: Negative for abdominal distention, abdominal pain, blood in stool, constipation, diarrhea, nausea and vomiting.  Genitourinary: Negative for difficulty urinating, discharge, flank pain, genital sores, hematuria and urgency.  Musculoskeletal: Negative for arthralgias, back pain, gait problem, joint swelling, myalgias and  neck stiffness.  Skin: Negative for rash.  Neurological: Negative for dizziness, syncope, speech difficulty, weakness, numbness and headaches.  Hematological: Negative for adenopathy. Does not bruise/bleed easily.  Psychiatric/Behavioral: Negative for behavioral problems and dysphoric mood. The patient is not nervous/anxious.        Objective:   Physical Exam  Constitutional: He is oriented to person, place, and time. He appears well-developed.  HENT:  Head: Normocephalic.  Right Ear: External ear normal.  Left Ear: External ear normal.  Eyes: Conjunctivae and EOM are normal.  Neck: Normal range of motion.  Cardiovascular: Normal rate and normal heart sounds.  Pulmonary/Chest: Breath sounds normal.  Abdominal: Bowel sounds are normal.  Musculoskeletal: Normal range of motion. He exhibits no edema or tenderness.  Neurological: He is alert and oriented to person, place, and time.  Psychiatric: He has a normal mood and affect. His behavior is normal.          Assessment & Plan:   Diabetes mellitus.  Improved with triple therapy.  No change in therapy.  Patient has been asked to return in 3 to 4 months for follow-up Essential hypertension stable CAD stable   Marletta Lor

## 2018-01-30 NOTE — Patient Instructions (Addendum)
Limit your sodium (Salt) intake   Please check your hemoglobin A1c every 3-4 months    It is important that you exercise regularly, at least 20 minutes 3 to 4 times per week.  If you develop chest pain or shortness of breath seek  medical attention.  Please see your eye doctor yearly to check for diabetic eye damage

## 2018-05-03 ENCOUNTER — Other Ambulatory Visit: Payer: Self-pay | Admitting: Internal Medicine

## 2018-05-03 DIAGNOSIS — Z0000 Encounter for general adult medical examination without abnormal findings: Secondary | ICD-10-CM

## 2018-05-03 DIAGNOSIS — E118 Type 2 diabetes mellitus with unspecified complications: Secondary | ICD-10-CM

## 2018-05-07 ENCOUNTER — Ambulatory Visit (INDEPENDENT_AMBULATORY_CARE_PROVIDER_SITE_OTHER): Payer: BLUE CROSS/BLUE SHIELD | Admitting: Family Medicine

## 2018-05-07 ENCOUNTER — Encounter: Payer: Self-pay | Admitting: Family Medicine

## 2018-05-07 VITALS — BP 130/82 | HR 86 | Temp 98.1°F | Wt 184.6 lb

## 2018-05-07 DIAGNOSIS — J069 Acute upper respiratory infection, unspecified: Secondary | ICD-10-CM

## 2018-05-07 DIAGNOSIS — E118 Type 2 diabetes mellitus with unspecified complications: Secondary | ICD-10-CM | POA: Diagnosis not present

## 2018-05-07 DIAGNOSIS — Z0000 Encounter for general adult medical examination without abnormal findings: Secondary | ICD-10-CM

## 2018-05-07 MED ORDER — LISINOPRIL 10 MG PO TABS: 10.0000 mg | ORAL_TABLET | Freq: Every day | ORAL | 0 refills | Status: DC

## 2018-05-07 MED ORDER — EMPAGLIFLOZIN 25 MG PO TABS: 25.0000 mg | ORAL_TABLET | Freq: Every day | ORAL | 0 refills | Status: DC

## 2018-05-07 NOTE — Progress Notes (Signed)
   Subjective:    Patient ID: Matthew Prince, male    DOB: Jan 06, 1956, 62 y.o.   MRN: [id number]  HPI Here for 3 days of stuffy head, PND, and a dry cough. No fever. He actually feels much better today. Taking Mucinex. He will be leaving the country in 2 days and he wants to make sure he is okay.    Review of Systems  Constitutional: Negative.   HENT: Positive for congestion, postnasal drip and sore throat.   Eyes: Negative.   Respiratory: Positive for cough.        Objective:   Physical Exam  Constitutional: He appears well-developed and well-nourished.  HENT:  Right Ear: External ear normal.  Left Ear: External ear normal.  Nose: Nose normal.  Mouth/Throat: Oropharynx is clear and moist.  Eyes: Conjunctivae are normal.  Neck: No thyromegaly present.  Pulmonary/Chest: Effort normal and breath sounds normal. No stridor. No respiratory distress. He has no wheezes. He has no rales.  Lymphadenopathy:    He has no cervical adenopathy.          Assessment & Plan:  He has a viral URI which is resolving. Drink fluids. Recheck prn.  Alysia Penna, MD

## 2018-06-04 ENCOUNTER — Encounter: Payer: BLUE CROSS/BLUE SHIELD | Admitting: Internal Medicine

## 2018-06-14 ENCOUNTER — Ambulatory Visit (INDEPENDENT_AMBULATORY_CARE_PROVIDER_SITE_OTHER): Payer: BLUE CROSS/BLUE SHIELD | Admitting: Internal Medicine

## 2018-06-14 ENCOUNTER — Encounter: Payer: Self-pay | Admitting: Internal Medicine

## 2018-06-14 VITALS — BP 130/80 | HR 75 | Temp 98.1°F | Wt 188.0 lb

## 2018-06-14 DIAGNOSIS — I2510 Atherosclerotic heart disease of native coronary artery without angina pectoris: Secondary | ICD-10-CM

## 2018-06-14 DIAGNOSIS — Z9861 Coronary angioplasty status: Secondary | ICD-10-CM

## 2018-06-14 DIAGNOSIS — I10 Essential (primary) hypertension: Secondary | ICD-10-CM

## 2018-06-14 DIAGNOSIS — E118 Type 2 diabetes mellitus with unspecified complications: Secondary | ICD-10-CM | POA: Diagnosis not present

## 2018-06-14 DIAGNOSIS — E1169 Type 2 diabetes mellitus with other specified complication: Secondary | ICD-10-CM

## 2018-06-14 DIAGNOSIS — I2102 ST elevation (STEMI) myocardial infarction involving left anterior descending coronary artery: Secondary | ICD-10-CM

## 2018-06-14 DIAGNOSIS — E1151 Type 2 diabetes mellitus with diabetic peripheral angiopathy without gangrene: Secondary | ICD-10-CM | POA: Insufficient documentation

## 2018-06-14 DIAGNOSIS — E785 Hyperlipidemia, unspecified: Secondary | ICD-10-CM

## 2018-06-14 LAB — POCT GLYCOSYLATED HEMOGLOBIN (HGB A1C): Hemoglobin A1C: 7.5 % — AB (ref 4.0–5.6)

## 2018-06-14 MED ORDER — SITAGLIPTIN PHOSPHATE 100 MG PO TABS: 100.0000 mg | ORAL_TABLET | Freq: Every day | ORAL | 3 refills | Status: DC

## 2018-06-14 NOTE — Patient Instructions (Signed)
-  It was nice meeting you today!  -Start taking Januvia 100 mg daily as a replacement for Jardiance.  -Check your fasting blood sugar every morning.  -Schedule follow up in 3 months for continued diabetic management.  -Make sure you have a yearly eye exam.  -Make sure you follow with cardiology as scheduled.

## 2018-06-14 NOTE — Progress Notes (Signed)
Established Patient Office Visit     CC/Reason for Visit: Establish care, follow-up on chronic medical conditions  HPI: Matthew Prince is a 63 y.o. male who is coming in today for the above mentioned reasons.  He is past due for his annual physical.  Past Medical History is significant for: ST elevated MI in 2014 with LAD stenting, he has not seen cardiology since April 2018, history of benign essential hypertension that has been well controlled, history of type 2 diabetes with most recent A1c of 6.7.  He has no acute complaints at today's visit.  When he went to refill his new prescription for Jardiance at the beginning of the year his co-pay was upwards of $300, he has not taken Jardiance in 2 weeks for this reason.   Past Medical/Surgical History: Past Medical History:  Diagnosis Date  . Allergy   . BPV (benign positional vertigo) 10/2009  . CAD S/P percutaneous coronary angioplasty    PCI to LAD, the DES  . Cardiomyopathy, ischemic - EF ~40-45% with distal Anterior - Antero& InferoApical hypokinesis. 11/29/2012   F/U Echo 02/2013: EF 50-55%. Mild anterior apical and apical lateral HK, Gr 1 DD  . Diabetes mellitus   . HISTORY OF: ST elevation myocardial infarction (STEMI) of inferior wall 11/29/2012    99% LAD occlusion - PCI with DES  . Hyperlipidemia   . Hypertension, essential   . Low back pain   . Presence of drug coated stent in LAD coronary artery 11/29/2012   Xience eXpedition DES 3.0 mm x 18 mm - (3.4 mm)    Past Surgical History:  Procedure Laterality Date  . CORONARY ANGIOPLASTY WITH STENT PLACEMENT  11/29/2012   99% thrombotic LAD occlusion - Xience Expedition 3.0 mm x 18 mm - post-dilated to 3.4 mm  . LEFT HEART CATHETERIZATION WITH CORONARY ANGIOGRAM N/A 11/29/2012   Procedure: LEFT HEART CATHETERIZATION WITH CORONARY ANGIOGRAM;  Surgeon: Leonie Man, MD;  Location: [facility] CATH LAB;  Service: Cardiovascular;  Laterality: N/A;  . NECK SURGERY     resection benign tumor  .  PERCUTANEOUS CORONARY STENT INTERVENTION (PCI-S) N/A 11/29/2012   Procedure: PERCUTANEOUS CORONARY STENT INTERVENTION (PCI-S);  Surgeon: Leonie Man, MD;  Location: [facility] CATH LAB;  Service: Cardiovascular;  Laterality: N/A;  . TRANSTHORACIC ECHOCARDIOGRAM  03/12/2013   Post MI: Low normal EF (50-55%), apical false tendon.  Mild HK of apical anterior and apical lateral wall.  Grade 1 diastolic dysfunction.    Social History:  reports that he has never smoked. He has never used smokeless tobacco. He reports that he does not drink alcohol. No history on file for drug.  Allergies: Allergies  Allergen Reactions  . Hydromet [Hydrocodone-Homatropine]     Lip swelling with concomitant use with tramadol  . Iodine     REACTION: unspecified  . Ultram [Tramadol]     Lip swelling with concomitant use of Hytromet    Family History:  Family History  Problem Relation Age of Onset  . Healthy Unknown        Noncontributory; he is not very aware of his family's history.     Current Outpatient Medications:  .  atorvastatin (LIPITOR) 80 MG tablet, TAKE 1 TABLET (80 MG TOTAL) BY MOUTH DAILY., Disp: 90 tablet, Rfl: 2 .  carvedilol (COREG) 12.5 MG tablet, TAKE 1 TABLET (12.5 MG TOTAL) BY MOUTH 2 (TWO) TIMES DAILY WITH A MEAL., Disp: 180 tablet, Rfl: 2 .  clopidogrel (PLAVIX) 75 MG tablet,  TAKE 1 TABLET (75 MG TOTAL) BY MOUTH DAILY., Disp: 90 tablet, Rfl: 2 .  glipiZIDE (GLUCOTROL XL) 10 MG 24 hr tablet, Take 1 tablet (10 mg total) by mouth daily with breakfast., Disp: 90 tablet, Rfl: 2 .  glucose blood test strip, Check glucose level before each meal ONE TOUCH VERIO TEST STRIP, Disp: 100 each, Rfl: 12 .  lisinopril (PRINIVIL,ZESTRIL) 10 MG tablet, Take 1 tablet (10 mg total) by mouth daily., Disp: 90 tablet, Rfl: 0 .  metFORMIN (GLUCOPHAGE) 1000 MG tablet, TAKE 1 TABLET (1,000 MG TOTAL) BY MOUTH 2 (TWO) TIMES DAILY WITH A MEAL., Disp: 180 tablet, Rfl: 2 .  nitroGLYCERIN (NITROSTAT) 0.4 MG SL tablet,  Place 1 tablet (0.4 mg total) under the tongue every 5 (five) minutes as needed for chest pain., Disp: 25 tablet, Rfl: 2 .  sitaGLIPtin (JANUVIA) 100 MG tablet, Take 1 tablet (100 mg total) by mouth daily., Disp: 30 tablet, Rfl: 3  Review of Systems:  Constitutional: Denies fever, chills, diaphoresis, appetite change and fatigue.  HEENT: Denies photophobia, eye pain, redness, hearing loss, ear pain, congestion, sore throat, rhinorrhea, sneezing, mouth sores, trouble swallowing, neck pain, neck stiffness and tinnitus.   Respiratory: Denies SOB, DOE, cough, chest tightness,  and wheezing.   Cardiovascular: Denies chest pain, palpitations and leg swelling.  Gastrointestinal: Denies nausea, vomiting, abdominal pain, diarrhea, constipation, blood in stool and abdominal distention.  Genitourinary: Denies dysuria, urgency, frequency, hematuria, flank pain and difficulty urinating.  Endocrine: Denies: hot or cold intolerance, sweats, changes in hair or nails, polyuria, polydipsia. Musculoskeletal: Denies myalgias, back pain, joint swelling, arthralgias and gait problem.  Skin: Denies pallor, rash and wound.  Neurological: Denies dizziness, seizures, syncope, weakness, light-headedness, numbness and headaches.  Hematological: Denies adenopathy. Easy bruising, personal or family bleeding history  Psychiatric/Behavioral: Denies suicidal ideation, mood changes, confusion, nervousness, sleep disturbance and agitation    Physical Exam: Vitals:   06/14/18 1545  BP: 130/80  Pulse: 75  Temp: 98.1 F (36.7 C)  TempSrc: Oral  SpO2: 96%  Weight: 188 lb (85.3 kg)    Body mass index is 28.59 kg/m.   Constitutional: NAD, calm, comfortable Eyes: PERRL, lids and conjunctivae normal ENMT: Mucous membranes are moist.  Respiratory: clear to auscultation bilaterally, no wheezing, no crackles. Normal respiratory effort. No accessory muscle use.  Cardiovascular: Regular rate and rhythm, no murmurs / rubs /  gallops. No extremity edema. 2+ pedal pulses. No carotid bruits.  Musculoskeletal: no clubbing / cyanosis. No joint deformity upper and lower extremities. Good ROM, no contractures. Normal muscle tone.  Skin: no rashes, lesions, ulcers. No induration Neurologic: CN 2-12 grossly intact. Sensation intact, DTR normal. Strength 5/5 in all 4.  Psychiatric: Normal judgment and insight. Alert and oriented x 3. Normal mood.    Impression and Plan:  DM (diabetes mellitus), type 2 with peripheral vascular complications ([place])  -His last A1c was 6.7, unfortunately has risen to 7.5 today as he has been off Jardiance for 3 weeks due to cost of medication. -We do not have samples or coupon cards available in the office. -He is already on maximal doses of metformin and glipizide and is loath to do any injectable therapy. -We do have some Januvia coupon cards in the office.  Will start Januvia 100 mg daily. -He has been advised to check fasting blood sugars every morning and will return in 3 months for follow-up.  He has been advised that if A1c is not at goal we may need to consider  long-acting insulin as an option.   Hyperlipidemia associated with type 2 diabetes mellitus ([place]) -On high-dose atorvastatin 80 mg daily, most recent LDL with good control of 58 as of March 2018. -Will need to consider repeat lipids at next visit.  ST elevation myocardial infarction involving left anterior descending (LAD) coronary artery (HCC) CAD (coronary artery disease), native coronary artery - (STEMI) LAD 99% Thrombosis - PCI with Xience eXpedition 3.0 mm x `18 mm (3.4 mm post) -Status post stenting to LAD. -Has not seen cardiology in over a year, advised routine follow-up. -Is on statin, Plavix, lisinopril, Coreg   Hypertension, essential -At goal.  Well-controlled.    Patient Instructions  -It was nice meeting you today!  -Start taking Januvia 100 mg daily as a replacement for Jardiance.  -Check your fasting  blood sugar every morning.  -Schedule follow up in 3 months for continued diabetic management.  -Make sure you have a yearly eye exam.  -Make sure you follow with cardiology as scheduled.     Lelon Frohlich, MD [facility] at [facility]

## 2018-07-28 ENCOUNTER — Other Ambulatory Visit: Payer: Self-pay | Admitting: Family Medicine

## 2018-07-28 DIAGNOSIS — Z0000 Encounter for general adult medical examination without abnormal findings: Secondary | ICD-10-CM

## 2018-07-28 DIAGNOSIS — E118 Type 2 diabetes mellitus with unspecified complications: Secondary | ICD-10-CM

## 2018-07-31 NOTE — Telephone Encounter (Signed)
Dr Hernandez pt 

## 2018-08-01 ENCOUNTER — Other Ambulatory Visit: Payer: Self-pay | Admitting: Internal Medicine

## 2018-08-22 ENCOUNTER — Telehealth: Payer: Self-pay | Admitting: Internal Medicine

## 2018-08-22 NOTE — Telephone Encounter (Signed)
Copied from [place] [id number]. Topic: Quick Communication - See Telephone Encounter >> Aug 22, 2018  4:09 PM Rayann Heman wrote: CRM for notification. See Telephone encounter for: 08/22/18. Pt called and left a voicemail that he is not feeling well today and would like a call back from a nurse. Please advise.

## 2018-08-23 ENCOUNTER — Other Ambulatory Visit: Payer: Self-pay

## 2018-08-23 ENCOUNTER — Ambulatory Visit (INDEPENDENT_AMBULATORY_CARE_PROVIDER_SITE_OTHER): Payer: BLUE CROSS/BLUE SHIELD | Admitting: Internal Medicine

## 2018-08-23 ENCOUNTER — Encounter: Payer: Self-pay | Admitting: *Deleted

## 2018-08-23 DIAGNOSIS — B9789 Other viral agents as the cause of diseases classified elsewhere: Secondary | ICD-10-CM | POA: Diagnosis not present

## 2018-08-23 DIAGNOSIS — J069 Acute upper respiratory infection, unspecified: Secondary | ICD-10-CM

## 2018-08-23 NOTE — Telephone Encounter (Signed)
WebEx appointment made 

## 2018-08-23 NOTE — Progress Notes (Signed)
Virtual Visit via Telephone Note  I connected with Matthew Prince on 08/23/18 at  9:00 AM EDT by telephone and verified that I am speaking with the correct person using two identifiers.  We attempted to use WebEx video chat, however due to difficulties on the patient's side, we were only able to do audio via WebEx.   I discussed the limitations, risks, security and privacy concerns of performing an evaluation and management service by telephone and the availability of in person appointments. I also discussed with the patient that there may be a patient responsible charge related to this service. The patient expressed understanding and agreed to proceed.  Location patient: home Location provider: work office Participants present for the call: patient, provider Patient did not have a visit in the prior 7 days to address this/these issue(s).   History of Present Illness:  Patient has scheduled this appointment due to concerns with a cough.  He has had a dry cough for 3 days, no fevers, no chills, no headaches, no body aches.  No sick contacts that he is aware of although he is still working as his job is considered essential.  He has had no recent travel.  He denies shortness of breath.   Observations/Objective: Patient sounds cheerful and well on the phone. I do not appreciate any increased work of breathing. Speech and thought processing are grossly intact. Patient reported vitals: None reported   Current Outpatient Medications:  .  atorvastatin (LIPITOR) 80 MG tablet, TAKE 1 TABLET (80 MG TOTAL) BY MOUTH DAILY., Disp: 90 tablet, Rfl: 2 .  carvedilol (COREG) 12.5 MG tablet, TAKE 1 TABLET (12.5 MG TOTAL) BY MOUTH 2 (TWO) TIMES DAILY WITH A MEAL., Disp: 180 tablet, Rfl: 2 .  clopidogrel (PLAVIX) 75 MG tablet, TAKE 1 TABLET (75 MG TOTAL) BY MOUTH DAILY., Disp: 90 tablet, Rfl: 2 .  glipiZIDE (GLUCOTROL XL) 10 MG 24 hr tablet, Take 1 tablet (10 mg total) by mouth daily with breakfast., Disp:  90 tablet, Rfl: 2 .  glucose blood test strip, Check glucose level before each meal ONE TOUCH VERIO TEST STRIP, Disp: 100 each, Rfl: 12 .  lisinopril (PRINIVIL,ZESTRIL) 10 MG tablet, TAKE 1 TABLET BY MOUTH EVERY DAY, Disp: 90 tablet, Rfl: 1 .  metFORMIN (GLUCOPHAGE) 1000 MG tablet, TAKE 1 TABLET (1,000 MG TOTAL) BY MOUTH 2 (TWO) TIMES DAILY WITH A MEAL., Disp: 180 tablet, Rfl: 2 .  nitroGLYCERIN (NITROSTAT) 0.4 MG SL tablet, Place 1 tablet (0.4 mg total) under the tongue every 5 (five) minutes as needed for chest pain., Disp: 25 tablet, Rfl: 2 .  sitaGLIPtin (JANUVIA) 100 MG tablet, Take 1 tablet (100 mg total) by mouth daily., Disp: 30 tablet, Rfl: 3  Review of Systems:  Constitutional: Denies fever, chills, diaphoresis, appetite change and fatigue.  HEENT: Denies photophobia, eye pain, redness, hearing loss, ear pain, congestion, sore throat, rhinorrhea, sneezing, mouth sores, trouble swallowing, neck pain, neck stiffness and tinnitus.   Respiratory: Denies SOB, DOE,  chest tightness,  and wheezing.   Cardiovascular: Denies chest pain, palpitations and leg swelling.  Gastrointestinal: Denies nausea, vomiting, abdominal pain, diarrhea, constipation, blood in stool and abdominal distention.  Genitourinary: Denies dysuria, urgency, frequency, hematuria, flank pain and difficulty urinating.  Endocrine: Denies: hot or cold intolerance, sweats, changes in hair or nails, polyuria, polydipsia. Musculoskeletal: Denies myalgias, back pain, joint swelling, arthralgias and gait problem.  Skin: Denies pallor, rash and wound.  Neurological: Denies dizziness, seizures, syncope, weakness, light-headedness, numbness and headaches.  Hematological: Denies adenopathy. Easy bruising, personal or family bleeding history  Psychiatric/Behavioral: Denies suicidal ideation, mood changes, confusion, nervousness, sleep disturbance and agitation   Assessment and Plan:  Viral URI with cough -Given limited exam  findings, PNA, pharyngitis, ear infection are not likely, hence abx have not been prescribed. -Have advised rest, fluids, OTC antihistamines, cough suppressants and mucinex. -RTC if no improvement in 10-14 days. -Have discussed stay at home guidelines and will provide with a letter to this effect that will be sent to him via MyChart for his employer.    I discussed the assessment and treatment plan with the patient. The patient was provided an opportunity to ask questions and all were answered. The patient agreed with the plan and demonstrated an understanding of the instructions.   The patient was advised to call back or seek an in-person evaluation if the symptoms worsen or if the condition fails to improve as anticipated.  I provided 20 minutes of non-face-to-face time during this encounter.   Lelon Frohlich, MD [facility] at [facility]

## 2018-08-27 ENCOUNTER — Telehealth: Payer: Self-pay | Admitting: *Deleted

## 2018-08-27 NOTE — Telephone Encounter (Signed)
Left message on machine for patient to call back and schedule a WebEx follow up visit with Dr Jerilee Hoh.  CRM

## 2018-08-28 ENCOUNTER — Other Ambulatory Visit: Payer: Self-pay

## 2018-08-28 ENCOUNTER — Ambulatory Visit (INDEPENDENT_AMBULATORY_CARE_PROVIDER_SITE_OTHER): Payer: BLUE CROSS/BLUE SHIELD | Admitting: Internal Medicine

## 2018-08-28 DIAGNOSIS — J069 Acute upper respiratory infection, unspecified: Secondary | ICD-10-CM | POA: Diagnosis not present

## 2018-08-28 DIAGNOSIS — B9789 Other viral agents as the cause of diseases classified elsewhere: Secondary | ICD-10-CM

## 2018-08-28 DIAGNOSIS — E118 Type 2 diabetes mellitus with unspecified complications: Secondary | ICD-10-CM

## 2018-08-28 MED ORDER — DULAGLUTIDE 0.75 MG/0.5ML ~~LOC~~ SOAJ: 0.7500 mg | SUBCUTANEOUS | 3 refills | Status: DC

## 2018-08-28 NOTE — Progress Notes (Signed)
Virtual Visit via Video Note  I connected with Matthew Prince on 08/28/18 at 10:00 AM EDT by a video enabled telemedicine application and verified that I am speaking with the correct person using two identifiers.  Location patient: home Location provider: work office Persons participating in the virtual visit: patient, provider  I discussed the limitations of evaluation and management by telemedicine and the availability of in person appointments. The patient expressed understanding and agreed to proceed.   HPI: We have set up this visit to follow up on his visit from last week for URI symptoms. He still has a mild, dry cough that is improving from last week. No fevers or SOB. He is monitoring his symptoms from home and self-isolating. He remains out of work for now. Letter to his employer was provided last week. He tells me that his CBGs remain high, never below 200. Most recent measurements 274, 378. These are FBS. Last visit his A1c had increased to 7.5 from 6.7. He is on max doses of metformin and glipizide. Had been on Jardiance with good control but now is unable to afford. We tried Januvia and he could not tolerate due to nausea. He has tried [place] and onglyza in past but had to stop due to cost issues.   ROS: Constitutional: Denies fever, chills, diaphoresis, appetite change and fatigue.  HEENT: Denies photophobia, eye pain, redness, hearing loss, ear pain,  mouth sores, trouble swallowing, neck pain, neck stiffness and tinnitus.   Respiratory: Denies SOB, DOE, chest tightness,  and wheezing.   Cardiovascular: Denies chest pain, palpitations and leg swelling.  Gastrointestinal: Denies nausea, vomiting, abdominal pain, diarrhea, constipation, blood in stool and abdominal distention.  Genitourinary: Denies dysuria, urgency, frequency, hematuria, flank pain and difficulty urinating.  Endocrine: Denies: hot or cold intolerance, sweats, changes in hair or nails, polyuria, polydipsia.  Musculoskeletal: Denies myalgias, back pain, joint swelling, arthralgias and gait problem.  Skin: Denies pallor, rash and wound.  Neurological: Denies dizziness, seizures, syncope, weakness, light-headedness, numbness and headaches.  Hematological: Denies adenopathy. Easy bruising, personal or family bleeding history  Psychiatric/Behavioral: Denies suicidal ideation, mood changes, confusion, nervousness, sleep disturbance and agitation   Past Medical History:  Diagnosis Date  . Allergy   . BPV (benign positional vertigo) 10/2009  . CAD S/P percutaneous coronary angioplasty    PCI to LAD, the DES  . Cardiomyopathy, ischemic - EF ~40-45% with distal Anterior - Antero& InferoApical hypokinesis. 11/29/2012   F/U Echo 02/2013: EF 50-55%. Mild anterior apical and apical lateral HK, Gr 1 DD  . Diabetes mellitus   . HISTORY OF: ST elevation myocardial infarction (STEMI) of inferior wall 11/29/2012    99% LAD occlusion - PCI with DES  . Hyperlipidemia   . Hypertension, essential   . Low back pain   . Presence of drug coated stent in LAD coronary artery 11/29/2012   Xience eXpedition DES 3.0 mm x 18 mm - (3.4 mm)    Past Surgical History:  Procedure Laterality Date  . CORONARY ANGIOPLASTY WITH STENT PLACEMENT  11/29/2012   99% thrombotic LAD occlusion - Xience Expedition 3.0 mm x 18 mm - post-dilated to 3.4 mm  . LEFT HEART CATHETERIZATION WITH CORONARY ANGIOGRAM N/A 11/29/2012   Procedure: LEFT HEART CATHETERIZATION WITH CORONARY ANGIOGRAM;  Surgeon: Leonie Man, MD;  Location: [facility] CATH LAB;  Service: Cardiovascular;  Laterality: N/A;  . NECK SURGERY     resection benign tumor  . PERCUTANEOUS CORONARY STENT INTERVENTION (PCI-S) N/A 11/29/2012  Procedure: PERCUTANEOUS CORONARY STENT INTERVENTION (PCI-S);  Surgeon: Leonie Man, MD;  Location: [facility] CATH LAB;  Service: Cardiovascular;  Laterality: N/A;  . TRANSTHORACIC ECHOCARDIOGRAM  03/12/2013   Post MI: Low normal EF (50-55%), apical false  tendon.  Mild HK of apical anterior and apical lateral wall.  Grade 1 diastolic dysfunction.    Family History  Problem Relation Age of Onset  . Healthy Unknown        Noncontributory; he is not very aware of his family's history.    SOCIAL HX:   reports that he has never smoked. He has never used smokeless tobacco. He reports that he does not drink alcohol. No history on file for drug.   Current Outpatient Medications:  .  atorvastatin (LIPITOR) 80 MG tablet, TAKE 1 TABLET (80 MG TOTAL) BY MOUTH DAILY., Disp: 90 tablet, Rfl: 2 .  carvedilol (COREG) 12.5 MG tablet, TAKE 1 TABLET (12.5 MG TOTAL) BY MOUTH 2 (TWO) TIMES DAILY WITH A MEAL., Disp: 180 tablet, Rfl: 2 .  clopidogrel (PLAVIX) 75 MG tablet, TAKE 1 TABLET (75 MG TOTAL) BY MOUTH DAILY., Disp: 90 tablet, Rfl: 2 .  Dulaglutide (TRULICITY) 9.47 SJ/6.2EZ SOPN, Inject 0.75 mg into the skin once a week for 30 days., Disp: 4 pen, Rfl: 3 .  glipiZIDE (GLUCOTROL XL) 10 MG 24 hr tablet, Take 1 tablet (10 mg total) by mouth daily with breakfast., Disp: 90 tablet, Rfl: 2 .  glucose blood test strip, Check glucose level before each meal ONE TOUCH VERIO TEST STRIP, Disp: 100 each, Rfl: 12 .  lisinopril (PRINIVIL,ZESTRIL) 10 MG tablet, TAKE 1 TABLET BY MOUTH EVERY DAY, Disp: 90 tablet, Rfl: 1 .  metFORMIN (GLUCOPHAGE) 1000 MG tablet, TAKE 1 TABLET (1,000 MG TOTAL) BY MOUTH 2 (TWO) TIMES DAILY WITH A MEAL., Disp: 180 tablet, Rfl: 2 .  nitroGLYCERIN (NITROSTAT) 0.4 MG SL tablet, Place 1 tablet (0.4 mg total) under the tongue every 5 (five) minutes as needed for chest pain., Disp: 25 tablet, Rfl: 2  EXAM:   VITALS per patient if applicable: none reported  GENERAL: alert, oriented, appears well and in no acute distress  HEENT: atraumatic, conjunttiva clear, no obvious abnormalities on inspection of external nose and ears  NECK: normal movements of the head and neck  LUNGS: on inspection no signs of respiratory distress, breathing rate appears  normal, no obvious gross increased work of breathing, gasping or wheezing  CV: no obvious cyanosis  MS: moves all visible extremities without noticeable abnormality  PSYCH/NEURO: pleasant and cooperative, no obvious depression or anxiety, speech and thought processing grossly intact  ASSESSMENT AND PLAN:   Viral URI with cough  -Progressing as expected. -Continue PRN cough suppressants. -He knows about warning signs/symptoms and when to seek care. -Continue to monitor and self-isolate at home.  Diabetes mellitus with complication (HCC) -Uncontrolled. -Try trulicity 6.62. -If cannot take due to financial reasons, may have to start basal insulin regimen. -Will monitor daily FBS and provide log at next  follow up visit.    I discussed the assessment and treatment plan with the patient. The patient was provided an opportunity to ask questions and all were answered. The patient agreed with the plan and demonstrated an understanding of the instructions.   The patient was advised to call back or seek an in-person evaluation if the symptoms worsen or if the condition fails to improve as anticipated.    Lelon Frohlich, MD  [facility] at [facility]

## 2018-08-28 NOTE — Telephone Encounter (Signed)
WebEx appointment scheduled

## 2018-09-02 ENCOUNTER — Telehealth: Payer: Self-pay | Admitting: *Deleted

## 2018-09-02 NOTE — Telephone Encounter (Signed)
We sent him a letter that said 7 days or 3 days after ALL symptoms have resolved, whichever period is longer

## 2018-09-02 NOTE — Telephone Encounter (Signed)
Copied from [place] [id number]. Topic: General - Other >> Sep 02, 2018  8:41 AM Valla Leaver wrote: Reason for CRM: Virtual visit done 08/28/2018 and no longer has symptoms. He needs to know when he needs to return to work? He has been in self isolation. Please advise. Prefers cell for e-visit.

## 2018-09-04 NOTE — Telephone Encounter (Signed)
I explained to the patient that another letter was not necessary.  Patient will return to work 09/05/2018.  Patient would like to keep his appointment to follow up with Dr Jerilee Hoh.

## 2018-09-10 ENCOUNTER — Ambulatory Visit (INDEPENDENT_AMBULATORY_CARE_PROVIDER_SITE_OTHER): Payer: BLUE CROSS/BLUE SHIELD | Admitting: Internal Medicine

## 2018-09-10 ENCOUNTER — Other Ambulatory Visit: Payer: Self-pay

## 2018-09-10 DIAGNOSIS — E118 Type 2 diabetes mellitus with unspecified complications: Secondary | ICD-10-CM | POA: Diagnosis not present

## 2018-09-10 NOTE — Progress Notes (Signed)
Virtual Visit via Telephone Note  I connected with Matthew Prince on 09/10/18 at 11:30 AM EDT by telephone and verified that I am speaking with the correct person using two identifiers.   I discussed the limitations, risks, security and privacy concerns of performing an evaluation and management service by telephone and the availability of in person appointments. I also discussed with the patient that there may be a patient responsible charge related to this service. The patient expressed understanding and agreed to proceed.  We initially attempted to connect via video chat but were unable to due to technical difficulties on the patient's end, so we converted this visit to a phone visit.   Location patient: home Location provider: work office Participants present for the call: patient, provider Patient did not have a visit in the prior 7 days to address this/these issue(s).   History of Present Illness:  This is a visit to follow up on viral URI and on DM since we started trulicity 3 weeks ago.  He has had no side effects with trulicity. He is able to financially afford it. His FBS have been between 100-115 since starting.  All URI symptoms have resolved and he has returned to work.   He has no acute complaints today.  Observations/Objective: Patient sounds cheerful and well on the phone. I do not appreciate any increased work of breathing. Speech and thought processing are grossly intact. Patient reported vitals: none reported   Current Outpatient Medications:  .  atorvastatin (LIPITOR) 80 MG tablet, TAKE 1 TABLET (80 MG TOTAL) BY MOUTH DAILY., Disp: 90 tablet, Rfl: 2 .  carvedilol (COREG) 12.5 MG tablet, TAKE 1 TABLET (12.5 MG TOTAL) BY MOUTH 2 (TWO) TIMES DAILY WITH A MEAL., Disp: 180 tablet, Rfl: 2 .  clopidogrel (PLAVIX) 75 MG tablet, TAKE 1 TABLET (75 MG TOTAL) BY MOUTH DAILY., Disp: 90 tablet, Rfl: 2 .  Dulaglutide (TRULICITY) 1.09 NA/3.5TD SOPN, Inject 0.75 mg into the  skin once a week for 30 days., Disp: 4 pen, Rfl: 3 .  glipiZIDE (GLUCOTROL XL) 10 MG 24 hr tablet, Take 1 tablet (10 mg total) by mouth daily with breakfast., Disp: 90 tablet, Rfl: 2 .  glucose blood test strip, Check glucose level before each meal ONE TOUCH VERIO TEST STRIP, Disp: 100 each, Rfl: 12 .  lisinopril (PRINIVIL,ZESTRIL) 10 MG tablet, TAKE 1 TABLET BY MOUTH EVERY DAY, Disp: 90 tablet, Rfl: 1 .  metFORMIN (GLUCOPHAGE) 1000 MG tablet, TAKE 1 TABLET (1,000 MG TOTAL) BY MOUTH 2 (TWO) TIMES DAILY WITH A MEAL., Disp: 180 tablet, Rfl: 2 .  nitroGLYCERIN (NITROSTAT) 0.4 MG SL tablet, Place 1 tablet (0.4 mg total) under the tongue every 5 (five) minutes as needed for chest pain., Disp: 25 tablet, Rfl: 2  Review of Systems:  Constitutional: Denies fever, chills, diaphoresis, appetite change and fatigue.  HEENT: Denies photophobia, eye pain, redness, hearing loss, ear pain, congestion, sore throat, rhinorrhea, sneezing, mouth sores, trouble swallowing, neck pain, neck stiffness and tinnitus.   Respiratory: Denies SOB, DOE, cough, chest tightness,  and wheezing.   Cardiovascular: Denies chest pain, palpitations and leg swelling.  Gastrointestinal: Denies nausea, vomiting, abdominal pain, diarrhea, constipation, blood in stool and abdominal distention.  Genitourinary: Denies dysuria, urgency, frequency, hematuria, flank pain and difficulty urinating.  Endocrine: Denies: hot or cold intolerance, sweats, changes in hair or nails, polyuria, polydipsia. Musculoskeletal: Denies myalgias, back pain, joint swelling, arthralgias and gait problem.  Skin: Denies pallor, rash and wound.  Neurological: Denies  dizziness, seizures, syncope, weakness, light-headedness, numbness and headaches.  Hematological: Denies adenopathy. Easy bruising, personal or family bleeding history  Psychiatric/Behavioral: Denies suicidal ideation, mood changes, confusion, nervousness, sleep disturbance and agitation   Assessment  and Plan:  Diabetes mellitus with complication (HCC) -Fasting CBGs 100-115 since initiating treatment with trulicity 7.42 3 weeks ago. -He is also on max doses of metformin and glipizide.  All viral URI symptoms have resolved and he is back at work.   I discussed the assessment and treatment plan with the patient. The patient was provided an opportunity to ask questions and all were answered. The patient agreed with the plan and demonstrated an understanding of the instructions.   The patient was advised to call back or seek an in-person evaluation if the symptoms worsen or if the condition fails to improve as anticipated.  I provided 12 minutes of non-face-to-face time during this encounter.   Lelon Frohlich, MD  Primary Care at [facility]

## 2018-09-13 ENCOUNTER — Ambulatory Visit: Payer: BLUE CROSS/BLUE SHIELD | Admitting: Internal Medicine

## 2018-09-18 ENCOUNTER — Telehealth: Payer: Self-pay | Admitting: Internal Medicine

## 2018-09-18 NOTE — Telephone Encounter (Signed)
Copied from [id number]. Topic: Quick Communication - See Telephone Encounter >> Sep 18, 2018  5:27 PM Ivar Drape wrote: CRM for notification. See Telephone encounter for: 09/18/18. Patient would like a refill on his glipiZIDE (GLUCOTROL XL) 10 MG 24 hr tablet medication and have it sent to his preferred pharmacy [facility] [address].

## 2018-09-19 MED ORDER — GLIPIZIDE ER 10 MG PO TB24: 10.0000 mg | ORAL_TABLET | Freq: Every day | ORAL | 1 refills | Status: DC

## 2018-12-09 ENCOUNTER — Telehealth: Payer: Self-pay | Admitting: Internal Medicine

## 2018-12-09 NOTE — Telephone Encounter (Signed)
refill request   metFORMIN (GLUCOPHAGE) 1000 MG tablet [[id number]]   carvedilol (COREG) 12.5 MG tablet [[id number]]   atorvastatin (LIPITOR) 80 MG tablet [[id number]]  clopidogrel (PLAVIX) 75 MG tablet [[id number]]   [id number] - [place], [place] - [address] AT [address] [phone] (Phone) [phone] (Fax

## 2018-12-10 MED ORDER — CARVEDILOL 12.5 MG PO TABS: 12.5000 mg | ORAL_TABLET | Freq: Two times a day (BID) | ORAL | 1 refills | Status: DC

## 2018-12-10 MED ORDER — CLOPIDOGREL BISULFATE 75 MG PO TABS: 75.0000 mg | ORAL_TABLET | Freq: Every day | ORAL | 1 refills | Status: DC

## 2018-12-10 MED ORDER — ATORVASTATIN CALCIUM 80 MG PO TABS: 80.0000 mg | ORAL_TABLET | Freq: Every day | ORAL | 1 refills | Status: DC

## 2018-12-10 MED ORDER — GLIPIZIDE ER 10 MG PO TB24: 10.0000 mg | ORAL_TABLET | Freq: Every day | ORAL | 1 refills | Status: DC

## 2018-12-10 NOTE — Telephone Encounter (Signed)
Refills sent

## 2018-12-28 ENCOUNTER — Other Ambulatory Visit: Payer: Self-pay | Admitting: Internal Medicine

## 2018-12-28 DIAGNOSIS — E118 Type 2 diabetes mellitus with unspecified complications: Secondary | ICD-10-CM

## 2019-01-18 ENCOUNTER — Other Ambulatory Visit: Payer: Self-pay | Admitting: Internal Medicine

## 2019-01-18 DIAGNOSIS — Z0000 Encounter for general adult medical examination without abnormal findings: Secondary | ICD-10-CM

## 2019-01-18 DIAGNOSIS — E118 Type 2 diabetes mellitus with unspecified complications: Secondary | ICD-10-CM

## 2019-01-20 ENCOUNTER — Other Ambulatory Visit: Payer: Self-pay | Admitting: Internal Medicine

## 2019-01-20 NOTE — Telephone Encounter (Signed)
Copied from [place] [id number]. Topic: Quick Communication - Rx Refill/Question >> Jan 20, 2019  4:06 PM Leward Quan A wrote: Medication: metFORMIN (GLUCOPHAGE) 1000 MG tablet   Has the patient contacted their pharmacy? Yes.   (Agent: If no, request that the patient contact the pharmacy for the refill.) (Agent: If yes, when and what did the pharmacy advise?)  Preferred Pharmacy (with phone number or street name): [id number] - [place], [address] AT [address] [phone] (Phone) [phone] (Fax)    Agent: Please be advised that RX refills may take up to 3 business days. We ask that you follow-up with your pharmacy.

## 2019-04-04 ENCOUNTER — Other Ambulatory Visit: Payer: Self-pay

## 2019-04-04 ENCOUNTER — Ambulatory Visit (INDEPENDENT_AMBULATORY_CARE_PROVIDER_SITE_OTHER): Payer: BC Managed Care – PPO | Admitting: Internal Medicine

## 2019-04-04 ENCOUNTER — Encounter: Payer: Self-pay | Admitting: Internal Medicine

## 2019-04-04 VITALS — BP 110/70 | HR 94 | Temp 97.8°F | Ht 67.5 in | Wt 188.9 lb

## 2019-04-04 DIAGNOSIS — Z23 Encounter for immunization: Secondary | ICD-10-CM

## 2019-04-04 DIAGNOSIS — I255 Ischemic cardiomyopathy: Secondary | ICD-10-CM | POA: Diagnosis not present

## 2019-04-04 DIAGNOSIS — I10 Essential (primary) hypertension: Secondary | ICD-10-CM | POA: Diagnosis not present

## 2019-04-04 DIAGNOSIS — E1151 Type 2 diabetes mellitus with diabetic peripheral angiopathy without gangrene: Secondary | ICD-10-CM

## 2019-04-04 DIAGNOSIS — E1169 Type 2 diabetes mellitus with other specified complication: Secondary | ICD-10-CM

## 2019-04-04 DIAGNOSIS — Z0000 Encounter for general adult medical examination without abnormal findings: Secondary | ICD-10-CM

## 2019-04-04 DIAGNOSIS — I2102 ST elevation (STEMI) myocardial infarction involving left anterior descending coronary artery: Secondary | ICD-10-CM

## 2019-04-04 DIAGNOSIS — E785 Hyperlipidemia, unspecified: Secondary | ICD-10-CM

## 2019-04-04 DIAGNOSIS — I2510 Atherosclerotic heart disease of native coronary artery without angina pectoris: Secondary | ICD-10-CM

## 2019-04-04 DIAGNOSIS — Z9861 Coronary angioplasty status: Secondary | ICD-10-CM

## 2019-04-04 NOTE — Patient Instructions (Signed)
-Nice seeing you today!!  -Return next week fasting for your blood work.  -Flu, pneumonia and first shingles vaccine today.  -Schedule follow up in 3 months.   Preventive Care 24-61 Years Old, Male Preventive care refers to lifestyle choices and visits with your health care provider that can promote health and wellness. This includes:  A yearly physical exam. This is also called an annual well check.  Regular dental and eye exams.  Immunizations.  Screening for certain conditions.  Healthy lifestyle choices, such as eating a healthy diet, getting regular exercise, not using drugs or products that contain nicotine and tobacco, and limiting alcohol use. What can I expect for my preventive care visit? Physical exam Your health care provider will check:  Height and weight. These may be used to calculate body mass index (BMI), which is a measurement that tells if you are at a healthy weight.  Heart rate and blood pressure.  Your skin for abnormal spots. Counseling Your health care provider may ask you questions about:  Alcohol, tobacco, and drug use.  Emotional well-being.  Home and relationship well-being.  Sexual activity.  Eating habits.  Work and work Statistician. What immunizations do I need?  Influenza (flu) vaccine  This is recommended every year. Tetanus, diphtheria, and pertussis (Tdap) vaccine  You may need a Td booster every 10 years. Varicella (chickenpox) vaccine  You may need this vaccine if you have not already been vaccinated. Zoster (shingles) vaccine  You may need this after age 84. Measles, mumps, and rubella (MMR) vaccine  You may need at least one dose of MMR if you were born in 1957 or later. You may also need a second dose. Pneumococcal conjugate (PCV13) vaccine  You may need this if you have certain conditions and were not previously vaccinated. Pneumococcal polysaccharide (PPSV23) vaccine  You may need one or two doses if you smoke  cigarettes or if you have certain conditions. Meningococcal conjugate (MenACWY) vaccine  You may need this if you have certain conditions. Hepatitis A vaccine  You may need this if you have certain conditions or if you travel or work in places where you may be exposed to hepatitis A. Hepatitis B vaccine  You may need this if you have certain conditions or if you travel or work in places where you may be exposed to hepatitis B. Haemophilus influenzae type b (Hib) vaccine  You may need this if you have certain risk factors. Human papillomavirus (HPV) vaccine  If recommended by your health care provider, you may need three doses over 6 months. You may receive vaccines as individual doses or as more than one vaccine together in one shot (combination vaccines). Talk with your health care provider about the risks and benefits of combination vaccines. What tests do I need? Blood tests  Lipid and cholesterol levels. These may be checked every 5 years, or more frequently if you are over 62 years old.  Hepatitis C test.  Hepatitis B test. Screening  Lung cancer screening. You may have this screening every year starting at age 68 if you have a 30-pack-year history of smoking and currently smoke or have quit within the past 15 years.  Prostate cancer screening. Recommendations will vary depending on your family history and other risks.  Colorectal cancer screening. All adults should have this screening starting at age 59 and continuing until age 78. Your health care provider may recommend screening at age 6 if you are at increased risk. You will have tests  every 1-10 years, depending on your results and the type of screening test.  Diabetes screening. This is done by checking your blood sugar (glucose) after you have not eaten for a while (fasting). You may have this done every 1-3 years.  Sexually transmitted disease (STD) testing. Follow these instructions at home: Eating and drinking   Eat a diet that includes fresh fruits and vegetables, whole grains, lean protein, and low-fat dairy products.  Take vitamin and mineral supplements as recommended by your health care provider.  Do not drink alcohol if your health care provider tells you not to drink.  If you drink alcohol: ? Limit how much you have to 0-2 drinks a day. ? Be aware of how much alcohol is in your drink. In the [place] one drink equals one 12 oz bottle of beer (355 mL), one 5 oz glass of wine (148 mL), or one 1 oz glass of hard liquor (44 mL). Lifestyle  Take daily care of your teeth and gums.  Stay active. Exercise for at least 30 minutes on 5 or more days each week.  Do not use any products that contain nicotine or tobacco, such as cigarettes, e-cigarettes, and chewing tobacco. If you need help quitting, ask your health care provider.  If you are sexually active, practice safe sex. Use a condom or other form of protection to prevent STIs (sexually transmitted infections).  Talk with your health care provider about taking a low-dose aspirin every day starting at age 34. What's next?  Go to your health care provider once a year for a well check visit.  Ask your health care provider how often you should have your eyes and teeth checked.  Stay up to date on all vaccines. This information is not intended to replace advice given to you by your health care provider. Make sure you discuss any questions you have with your health care provider. Document Released: 06/11/2015 Document Revised: 05/09/2018 Document Reviewed: 05/09/2018 Elsevier Patient Education  2020 [employer].

## 2019-04-04 NOTE — Addendum Note (Signed)
Addended by: Westley Hummer B on: 04/04/2019 05:31 PM   Modules accepted: Orders

## 2019-04-04 NOTE — Progress Notes (Signed)
Established Patient Office Visit     CC/Reason for Visit: Annual preventive exam  HPI: Matthew Prince is a 63 y.o. male who is coming in today for the above mentioned reasons. Past Medical History is significant for: Coronary artery disease status post ST elevated MI in 2014 with LAD stenting, benign essential hypertension that has been well controlled, history of type 2 diabetes, history of hyperlipidemia.  He has no acute complaints today.  He has routine eye and dental care.  He does not do much exercise.  He had a colonoscopy in 2012 and is a 10-year callback, has eye exam scheduled for later this year.  He will receive flu, Pneumovax and for shingles.   Past Medical/Surgical History: Past Medical History:  Diagnosis Date   Allergy    BPV (benign positional vertigo) 10/2009   CAD S/P percutaneous coronary angioplasty    PCI to LAD, the DES   Cardiomyopathy, ischemic - EF ~40-45% with distal Anterior - Antero& InferoApical hypokinesis. 11/29/2012   F/U Echo 02/2013: EF 50-55%. Mild anterior apical and apical lateral HK, Gr 1 DD   Diabetes mellitus    HISTORY OF: ST elevation myocardial infarction (STEMI) of inferior wall 11/29/2012    99% LAD occlusion - PCI with DES   Hyperlipidemia    Hypertension, essential    Low back pain    Presence of drug coated stent in LAD coronary artery 11/29/2012   Xience eXpedition DES 3.0 mm x 18 mm - (3.4 mm)    Past Surgical History:  Procedure Laterality Date   CORONARY ANGIOPLASTY WITH STENT PLACEMENT  11/29/2012   99% thrombotic LAD occlusion - Xience Expedition 3.0 mm x 18 mm - post-dilated to 3.4 mm   LEFT HEART CATHETERIZATION WITH CORONARY ANGIOGRAM N/A 11/29/2012   Procedure: LEFT HEART CATHETERIZATION WITH CORONARY ANGIOGRAM;  Surgeon: Leonie Man, MD;  Location: [facility] CATH LAB;  Service: Cardiovascular;  Laterality: N/A;   NECK SURGERY     resection benign tumor   PERCUTANEOUS CORONARY STENT INTERVENTION (PCI-S) N/A 11/29/2012     Procedure: PERCUTANEOUS CORONARY STENT INTERVENTION (PCI-S);  Surgeon: Leonie Man, MD;  Location: [facility] CATH LAB;  Service: Cardiovascular;  Laterality: N/A;   TRANSTHORACIC ECHOCARDIOGRAM  03/12/2013   Post MI: Low normal EF (50-55%), apical false tendon.  Mild HK of apical anterior and apical lateral wall.  Grade 1 diastolic dysfunction.    Social History:  reports that he has never smoked. He has never used smokeless tobacco. He reports that he does not drink alcohol. No history on file for drug.  Allergies: Allergies  Allergen Reactions   Hydromet [Hydrocodone-Homatropine]     Lip swelling with concomitant use with tramadol   Iodine     REACTION: unspecified   Ultram [Tramadol]     Lip swelling with concomitant use of Hytromet    Family History:  Family History  Problem Relation Age of Onset   Healthy Unknown        Noncontributory; he is not very aware of his family's history.     Current Outpatient Medications:    atorvastatin (LIPITOR) 80 MG tablet, Take 1 tablet (80 mg total) by mouth daily., Disp: 90 tablet, Rfl: 1   carvedilol (COREG) 12.5 MG tablet, Take 1 tablet (12.5 mg total) by mouth 2 (two) times daily with a meal., Disp: 180 tablet, Rfl: 1   clopidogrel (PLAVIX) 75 MG tablet, Take 1 tablet (75 mg total) by mouth daily., Disp: 90 tablet, Rfl:  1   glipiZIDE (GLUCOTROL XL) 10 MG 24 hr tablet, Take 1 tablet (10 mg total) by mouth daily with breakfast., Disp: 90 tablet, Rfl: 1   glucose blood test strip, Check glucose level before each meal ONE TOUCH VERIO TEST STRIP, Disp: 100 each, Rfl: 12   lisinopril (ZESTRIL) 10 MG tablet, TAKE 1 TABLET BY MOUTH EVERY DAY, Disp: 90 tablet, Rfl: 1   metFORMIN (GLUCOPHAGE) 1000 MG tablet, TAKE 1 TABLET (1,000 MG TOTAL) BY MOUTH 2 (TWO) TIMES DAILY WITH A MEAL., Disp: 180 tablet, Rfl: 1   nitroGLYCERIN (NITROSTAT) 0.4 MG SL tablet, Place 1 tablet (0.4 mg total) under the tongue every 5 (five) minutes as needed for  chest pain., Disp: 25 tablet, Rfl: 2   TRULICITY 5.57 DU/2.0UR SOPN, INJECT 0.75 MG INTO THE SKIN ONCE A WEEK, Disp: 2 pen, Rfl: 5  Review of Systems:  Constitutional: Denies fever, chills, diaphoresis, appetite change and fatigue.  HEENT: Denies photophobia, eye pain, redness, hearing loss, ear pain, congestion, sore throat, rhinorrhea, sneezing, mouth sores, trouble swallowing, neck pain, neck stiffness and tinnitus.   Respiratory: Denies SOB, DOE, cough, chest tightness,  and wheezing.   Cardiovascular: Denies chest pain, palpitations and leg swelling.  Gastrointestinal: Denies nausea, vomiting, abdominal pain, diarrhea, constipation, blood in stool and abdominal distention.  Genitourinary: Denies dysuria, urgency, frequency, hematuria, flank pain and difficulty urinating.  Endocrine: Denies: hot or cold intolerance, sweats, changes in hair or nails, polyuria, polydipsia. Musculoskeletal: Denies myalgias, back pain, joint swelling, arthralgias and gait problem.  Skin: Denies pallor, rash and wound.  Neurological: Denies dizziness, seizures, syncope, weakness, light-headedness, numbness and headaches.  Hematological: Denies adenopathy. Easy bruising, personal or family bleeding history  Psychiatric/Behavioral: Denies suicidal ideation, mood changes, confusion, nervousness, sleep disturbance and agitation    Physical Exam: Vitals:   04/04/19 0753  BP: 110/70  Pulse: 94  Temp: 97.8 F (36.6 C)  TempSrc: Temporal  SpO2: 98%  Weight: 188 lb 14.4 oz (85.7 kg)  Height: 5' 7.5" (1.715 m)    Body mass index is 29.15 kg/m.   Constitutional: NAD, calm, comfortable Eyes: PERRL, lids and conjunctivae normal ENMT: Mucous membranes are moist.Tympanic membrane is pearly white, no erythema or bulging. Neck: normal, supple, no masses, no thyromegaly Respiratory: clear to auscultation bilaterally, no wheezing, no crackles. Normal respiratory effort. No accessory muscle use.  Cardiovascular:  Regular rate and rhythm, no murmurs / rubs / gallops. No extremity edema. 2+ pedal pulses.  Abdomen: no tenderness, no masses palpated. No hepatosplenomegaly. Bowel sounds positive.  Musculoskeletal: no clubbing / cyanosis. No joint deformity upper and lower extremities. Good ROM, no contractures. Normal muscle tone.  Skin: no rashes, lesions, ulcers. No induration Neurologic: CN 2-12 grossly intact. Sensation intact, DTR normal. Strength 5/5 in all 4.  Psychiatric: Normal judgment and insight. Alert and oriented x 3. Normal mood.    Impression and Plan:  Encounter for preventive health examination  -Has routine eye and dental care. -He will receive flu, Pneumovax and for shingles vaccine today.  This will make him up-to-date on immunizations. -He is not fasting today, so will return next week for fasting labs. -Healthy lifestyle has been discussed in detail. -Had a colonoscopy in 2012 and is a 10-year callback. -Check PSA for prostate cancer screening. -He is overdue for diabetic eye exam but this is scheduled for December.  DM (diabetes mellitus), type 2 with peripheral vascular complications (HCC)  -Last A1c was 7.5 in January 2020.  Recheck A1c today. -He is  on maximal doses of Metformin and glipizide, also on Trulicity 0.99 weekly, states his CBGs are mostly well controlled with occasional rises above 300 if he has heavy carb loaded meal.  Hypertension, essential  -Well-controlled  Cardiomyopathy, ischemic - EF improved to 50 at 55% (near normal)) with distal Anterior - Antero& InferoApical hypokinesis. ST elevation myocardial infarction involving left anterior descending (LAD) coronary artery (HCC) CAD (coronary artery disease), native coronary artery - (STEMI) LAD 99% Thrombosis - PCI with Xience eXpedition 3.0 mm x `18 mm (3.4 mm post) -Stable, no chest pain, continue ACE inhibitor, beta-blocker, statin.  Hyperlipidemia associated with type 2 diabetes mellitus ([place]) -Last LDL  was 58 in March 2018, continue current Lipitor dose and check lipids today.   Patient Instructions  -Nice seeing you today!!  -Return next week fasting for your blood work.  -Flu, pneumonia and first shingles vaccine today.  -Schedule follow up in 3 months.   Preventive Care 35-65 Years Old, Male Preventive care refers to lifestyle choices and visits with your health care provider that can promote health and wellness. This includes:  A yearly physical exam. This is also called an annual well check.  Regular dental and eye exams.  Immunizations.  Screening for certain conditions.  Healthy lifestyle choices, such as eating a healthy diet, getting regular exercise, not using drugs or products that contain nicotine and tobacco, and limiting alcohol use. What can I expect for my preventive care visit? Physical exam Your health care provider will check:  Height and weight. These may be used to calculate body mass index (BMI), which is a measurement that tells if you are at a healthy weight.  Heart rate and blood pressure.  Your skin for abnormal spots. Counseling Your health care provider may ask you questions about:  Alcohol, tobacco, and drug use.  Emotional well-being.  Home and relationship well-being.  Sexual activity.  Eating habits.  Work and work Statistician. What immunizations do I need?  Influenza (flu) vaccine  This is recommended every year. Tetanus, diphtheria, and pertussis (Tdap) vaccine  You may need a Td booster every 10 years. Varicella (chickenpox) vaccine  You may need this vaccine if you have not already been vaccinated. Zoster (shingles) vaccine  You may need this after age 89. Measles, mumps, and rubella (MMR) vaccine  You may need at least one dose of MMR if you were born in 1957 or later. You may also need a second dose. Pneumococcal conjugate (PCV13) vaccine  You may need this if you have certain conditions and were not previously  vaccinated. Pneumococcal polysaccharide (PPSV23) vaccine  You may need one or two doses if you smoke cigarettes or if you have certain conditions. Meningococcal conjugate (MenACWY) vaccine  You may need this if you have certain conditions. Hepatitis A vaccine  You may need this if you have certain conditions or if you travel or work in places where you may be exposed to hepatitis A. Hepatitis B vaccine  You may need this if you have certain conditions or if you travel or work in places where you may be exposed to hepatitis B. Haemophilus influenzae type b (Hib) vaccine  You may need this if you have certain risk factors. Human papillomavirus (HPV) vaccine  If recommended by your health care provider, you may need three doses over 6 months. You may receive vaccines as individual doses or as more than one vaccine together in one shot (combination vaccines). Talk with your health care provider about the risks  and benefits of combination vaccines. What tests do I need? Blood tests  Lipid and cholesterol levels. These may be checked every 5 years, or more frequently if you are over 65 years old.  Hepatitis C test.  Hepatitis B test. Screening  Lung cancer screening. You may have this screening every year starting at age 59 if you have a 30-pack-year history of smoking and currently smoke or have quit within the past 15 years.  Prostate cancer screening. Recommendations will vary depending on your family history and other risks.  Colorectal cancer screening. All adults should have this screening starting at age 38 and continuing until age 28. Your health care provider may recommend screening at age 3 if you are at increased risk. You will have tests every 1-10 years, depending on your results and the type of screening test.  Diabetes screening. This is done by checking your blood sugar (glucose) after you have not eaten for a while (fasting). You may have this done every 1-3  years.  Sexually transmitted disease (STD) testing. Follow these instructions at home: Eating and drinking  Eat a diet that includes fresh fruits and vegetables, whole grains, lean protein, and low-fat dairy products.  Take vitamin and mineral supplements as recommended by your health care provider.  Do not drink alcohol if your health care provider tells you not to drink.  If you drink alcohol: ? Limit how much you have to 0-2 drinks a day. ? Be aware of how much alcohol is in your drink. In the [place] one drink equals one 12 oz bottle of beer (355 mL), one 5 oz glass of wine (148 mL), or one 1 oz glass of hard liquor (44 mL). Lifestyle  Take daily care of your teeth and gums.  Stay active. Exercise for at least 30 minutes on 5 or more days each week.  Do not use any products that contain nicotine or tobacco, such as cigarettes, e-cigarettes, and chewing tobacco. If you need help quitting, ask your health care provider.  If you are sexually active, practice safe sex. Use a condom or other form of protection to prevent STIs (sexually transmitted infections).  Talk with your health care provider about taking a low-dose aspirin every day starting at age 43. What's next?  Go to your health care provider once a year for a well check visit.  Ask your health care provider how often you should have your eyes and teeth checked.  Stay up to date on all vaccines. This information is not intended to replace advice given to you by your health care provider. Make sure you discuss any questions you have with your health care provider. Document Released: 06/11/2015 Document Revised: 05/09/2018 Document Reviewed: 05/09/2018 Elsevier Patient Education  2020 [place], MD [facility]

## 2019-04-11 ENCOUNTER — Other Ambulatory Visit: Payer: BC Managed Care – PPO

## 2019-04-11 ENCOUNTER — Other Ambulatory Visit: Payer: Self-pay

## 2019-04-11 DIAGNOSIS — E1151 Type 2 diabetes mellitus with diabetic peripheral angiopathy without gangrene: Secondary | ICD-10-CM | POA: Diagnosis not present

## 2019-04-11 DIAGNOSIS — I10 Essential (primary) hypertension: Secondary | ICD-10-CM

## 2019-04-11 DIAGNOSIS — E1169 Type 2 diabetes mellitus with other specified complication: Secondary | ICD-10-CM | POA: Diagnosis not present

## 2019-04-11 DIAGNOSIS — E785 Hyperlipidemia, unspecified: Secondary | ICD-10-CM

## 2019-04-11 DIAGNOSIS — Z0000 Encounter for general adult medical examination without abnormal findings: Secondary | ICD-10-CM

## 2019-04-12 LAB — LIPID PANEL
Cholesterol: 122 mg/dL (ref <200)
HDL: 28 mg/dL — ABNORMAL LOW (ref >=40)
LDL Cholesterol (Calc): 78 mg/dL (calc)
Non-HDL Cholesterol (Calc): 94 mg/dL (calc) (ref <130)
Total CHOL/HDL Ratio: 4.4 (calc) (ref <5.0)
Triglycerides: 77 mg/dL (ref <150)

## 2019-04-12 LAB — CBC WITH DIFFERENTIAL/PLATELET
Absolute Monocytes: 574 cells/uL (ref 200–950)
Basophils Absolute: 74 cells/uL (ref 0–200)
Basophils Relative: 0.9 %
Eosinophils Absolute: 148 cells/uL (ref 15–500)
Eosinophils Relative: 1.8 %
HCT: 41.2 % (ref 38.5–50.0)
Hemoglobin: 13.4 g/dL (ref 13.2–17.1)
Lymphs Abs: 4641 cells/uL — ABNORMAL HIGH (ref 850–3900)
MCH: 28.8 pg (ref 27.0–33.0)
MCHC: 32.5 g/dL (ref 32.0–36.0)
MCV: 88.6 fL (ref 80.0–100.0)
MPV: 10.5 fL (ref 7.5–12.5)
Monocytes Relative: 7 %
Neutro Abs: 2763 cells/uL (ref 1500–7800)
Neutrophils Relative %: 33.7 %
Platelets: 284 10*3/uL (ref 140–400)
RBC: 4.65 10*6/uL (ref 4.20–5.80)
RDW: 13.1 % (ref 11.0–15.0)
Total Lymphocyte: 56.6 %
WBC: 8.2 10*3/uL (ref 3.8–10.8)

## 2019-04-12 LAB — COMPREHENSIVE METABOLIC PANEL
AG Ratio: 1.7 (calc) (ref 1.0–2.5)
ALT: 31 U/L (ref 9–46)
AST: 28 U/L (ref 10–35)
Albumin: 5 g/dL (ref 3.6–5.1)
Alkaline phosphatase (APISO): 44 U/L (ref 35–144)
BUN: 11 mg/dL (ref 7–25)
CO2: 28 mmol/L (ref 20–32)
Calcium: 10.2 mg/dL (ref 8.6–10.3)
Chloride: 101 mmol/L (ref 98–110)
Creat: 0.98 mg/dL (ref 0.70–1.25)
Globulin: 3 g/dL (calc) (ref 1.9–3.7)
Glucose, Bld: 106 mg/dL — ABNORMAL HIGH (ref 65–99)
Potassium: 4.1 mmol/L (ref 3.5–5.3)
Sodium: 138 mmol/L (ref 135–146)
Total Bilirubin: 0.5 mg/dL (ref 0.2–1.2)
Total Protein: 8 g/dL (ref 6.1–8.1)

## 2019-04-12 LAB — HEMOGLOBIN A1C
Hgb A1c MFr Bld: 7.5 % of total Hgb — ABNORMAL HIGH (ref <5.7)
Mean Plasma Glucose: 169 (calc)
eAG (mmol/L): 9.3 (calc)

## 2019-04-12 LAB — VITAMIN D 25 HYDROXY (VIT D DEFICIENCY, FRACTURES): Vit D, 25-Hydroxy: 19 ng/mL — ABNORMAL LOW (ref 30–100)

## 2019-04-12 LAB — PSA: PSA: 0.7 ng/mL (ref <=4.0)

## 2019-04-12 LAB — VITAMIN B12: Vitamin B-12: 430 pg/mL (ref 200–1100)

## 2019-04-12 LAB — TSH: TSH: 2.75 mIU/L (ref 0.40–4.50)

## 2019-04-16 ENCOUNTER — Encounter: Payer: Self-pay | Admitting: Internal Medicine

## 2019-04-16 ENCOUNTER — Other Ambulatory Visit: Payer: Self-pay | Admitting: Internal Medicine

## 2019-04-16 DIAGNOSIS — E559 Vitamin D deficiency, unspecified: Secondary | ICD-10-CM | POA: Insufficient documentation

## 2019-04-16 DIAGNOSIS — E118 Type 2 diabetes mellitus with unspecified complications: Secondary | ICD-10-CM

## 2019-04-16 MED ORDER — TRULICITY 1.5 MG/0.5ML ~~LOC~~ SOAJ: 1.5000 mg | SUBCUTANEOUS | 1 refills | Status: DC

## 2019-04-16 MED ORDER — VITAMIN D (ERGOCALCIFEROL) 1.25 MG (50000 UNIT) PO CAPS: 50000.0000 [IU] | ORAL_CAPSULE | ORAL | 0 refills | Status: AC

## 2019-06-06 ENCOUNTER — Other Ambulatory Visit: Payer: Self-pay | Admitting: Internal Medicine

## 2019-07-03 ENCOUNTER — Other Ambulatory Visit: Payer: Self-pay | Admitting: Internal Medicine

## 2019-07-03 DIAGNOSIS — E559 Vitamin D deficiency, unspecified: Secondary | ICD-10-CM

## 2019-07-10 ENCOUNTER — Other Ambulatory Visit: Payer: Self-pay

## 2019-07-10 ENCOUNTER — Encounter: Payer: Self-pay | Admitting: Internal Medicine

## 2019-07-10 ENCOUNTER — Ambulatory Visit (INDEPENDENT_AMBULATORY_CARE_PROVIDER_SITE_OTHER): Payer: BC Managed Care – PPO | Admitting: Internal Medicine

## 2019-07-10 VITALS — BP 120/84 | HR 71 | Temp 97.6°F | Wt 194.0 lb

## 2019-07-10 DIAGNOSIS — I2510 Atherosclerotic heart disease of native coronary artery without angina pectoris: Secondary | ICD-10-CM

## 2019-07-10 DIAGNOSIS — E559 Vitamin D deficiency, unspecified: Secondary | ICD-10-CM | POA: Diagnosis not present

## 2019-07-10 DIAGNOSIS — E1169 Type 2 diabetes mellitus with other specified complication: Secondary | ICD-10-CM

## 2019-07-10 DIAGNOSIS — E1151 Type 2 diabetes mellitus with diabetic peripheral angiopathy without gangrene: Secondary | ICD-10-CM

## 2019-07-10 DIAGNOSIS — Z9861 Coronary angioplasty status: Secondary | ICD-10-CM

## 2019-07-10 DIAGNOSIS — E118 Type 2 diabetes mellitus with unspecified complications: Secondary | ICD-10-CM

## 2019-07-10 DIAGNOSIS — E785 Hyperlipidemia, unspecified: Secondary | ICD-10-CM

## 2019-07-10 DIAGNOSIS — I10 Essential (primary) hypertension: Secondary | ICD-10-CM

## 2019-07-10 LAB — POCT GLYCOSYLATED HEMOGLOBIN (HGB A1C): Hemoglobin A1C: 7.1 % — AB (ref 4.0–5.6)

## 2019-07-10 NOTE — Patient Instructions (Signed)
-  Nice seeing you today!!  -See you back in 3 months. 

## 2019-07-10 NOTE — Progress Notes (Signed)
Established Patient Office Visit     This visit occurred during the SARS-CoV-2 public health emergency.  Safety protocols were in place, including screening questions prior to the visit, additional usage of staff PPE, and extensive cleaning of exam room while observing appropriate contact time as indicated for disinfecting solutions.    CC/Reason for Visit: 83-month follow-up chronic medical conditions  HPI: Matthew Prince is a 64 y.o. male who is coming in today for the above mentioned reasons. Past Medical History is significant for: Coronary artery disease status post ST elevated MI in 2014 with LAD stenting, benign essential hypertension that has been well controlled, history of type 2 diabetes, history of hyperlipidemia.  Last visit in November we had increased his Trulicity dose as his 123456 was above goal at 7.5.  Unfortunately he did not get this medication until last week so his A1c today will not adequately reflect that change.  He is otherwise doing well.  He has no complaints since last visit.   Past Medical/Surgical History: Past Medical History:  Diagnosis Date  . Allergy   . BPV (benign positional vertigo) 10/2009  . CAD S/P percutaneous coronary angioplasty    PCI to LAD, the DES  . Cardiomyopathy, ischemic - EF ~40-45% with distal Anterior - Antero& InferoApical hypokinesis. 11/29/2012   F/U Echo 02/2013: EF 50-55%. Mild anterior apical and apical lateral HK, Gr 1 DD  . Diabetes mellitus   . HISTORY OF: ST elevation myocardial infarction (STEMI) of inferior wall 11/29/2012    99% LAD occlusion - PCI with DES  . Hyperlipidemia   . Hypertension, essential   . Low back pain   . Presence of drug coated stent in LAD coronary artery 11/29/2012   Xience eXpedition DES 3.0 mm x 18 mm - (3.4 mm)    Past Surgical History:  Procedure Laterality Date  . CORONARY ANGIOPLASTY WITH STENT PLACEMENT  11/29/2012   99% thrombotic LAD occlusion - Xience Expedition 3.0 mm x 18 mm -  post-dilated to 3.4 mm  . LEFT HEART CATHETERIZATION WITH CORONARY ANGIOGRAM N/A 11/29/2012   Procedure: LEFT HEART CATHETERIZATION WITH CORONARY ANGIOGRAM;  Surgeon: Leonie Man, MD;  Location: [facility] CATH LAB;  Service: Cardiovascular;  Laterality: N/A;  . NECK SURGERY     resection benign tumor  . PERCUTANEOUS CORONARY STENT INTERVENTION (PCI-S) N/A 11/29/2012   Procedure: PERCUTANEOUS CORONARY STENT INTERVENTION (PCI-S);  Surgeon: Leonie Man, MD;  Location: [facility] CATH LAB;  Service: Cardiovascular;  Laterality: N/A;  . TRANSTHORACIC ECHOCARDIOGRAM  03/12/2013   Post MI: Low normal EF (50-55%), apical false tendon.  Mild HK of apical anterior and apical lateral wall.  Grade 1 diastolic dysfunction.    Social History:  reports that he has never smoked. He has never used smokeless tobacco. He reports that he does not drink alcohol. No history on file for drug.  Allergies: Allergies  Allergen Reactions  . Hydromet [Hydrocodone-Homatropine]     Lip swelling with concomitant use with tramadol  . Iodine     REACTION: unspecified  . Ultram [Tramadol]     Lip swelling with concomitant use of Hytromet    Family History:  Family History  Problem Relation Age of Onset  . Healthy Unknown        Noncontributory; he is not very aware of his family's history.     Current Outpatient Medications:  .  atorvastatin (LIPITOR) 80 MG tablet, TAKE 1 TABLET BY MOUTH EVERY DAY, Disp: 90 tablet, Rfl:  1 .  carvedilol (COREG) 12.5 MG tablet, TAKE 1 TABLET BY MOUTH TWICE A DAY WITH MEALS, Disp: 180 tablet, Rfl: 1 .  clopidogrel (PLAVIX) 75 MG tablet, TAKE 1 TABLET BY MOUTH EVERY DAY, Disp: 90 tablet, Rfl: 1 .  Dulaglutide (TRULICITY) 1.5 0000000 SOPN, Inject 1.5 mg into the skin once a week., Disp: 12 pen, Rfl: 1 .  glipiZIDE (GLUCOTROL XL) 10 MG 24 hr tablet, Take 1 tablet (10 mg total) by mouth daily with breakfast., Disp: 90 tablet, Rfl: 1 .  glucose blood test strip, Check glucose level before each  meal ONE TOUCH VERIO TEST STRIP, Disp: 100 each, Rfl: 12 .  lisinopril (ZESTRIL) 10 MG tablet, TAKE 1 TABLET BY MOUTH EVERY DAY, Disp: 90 tablet, Rfl: 1 .  metFORMIN (GLUCOPHAGE) 1000 MG tablet, TAKE 1 TABLET (1,000 MG TOTAL) BY MOUTH 2 (TWO) TIMES DAILY WITH A MEAL., Disp: 180 tablet, Rfl: 1 .  nitroGLYCERIN (NITROSTAT) 0.4 MG SL tablet, Place 1 tablet (0.4 mg total) under the tongue every 5 (five) minutes as needed for chest pain., Disp: 25 tablet, Rfl: 2  Review of Systems:  Constitutional: Denies fever, chills, diaphoresis, appetite change and fatigue.  HEENT: Denies photophobia, eye pain, redness, hearing loss, ear pain, congestion, sore throat, rhinorrhea, sneezing, mouth sores, trouble swallowing, neck pain, neck stiffness and tinnitus.   Respiratory: Denies SOB, DOE, cough, chest tightness,  and wheezing.   Cardiovascular: Denies chest pain, palpitations and leg swelling.  Gastrointestinal: Denies nausea, vomiting, abdominal pain, diarrhea, constipation, blood in stool and abdominal distention.  Genitourinary: Denies dysuria, urgency, frequency, hematuria, flank pain and difficulty urinating.  Endocrine: Denies: hot or cold intolerance, sweats, changes in hair or nails, polyuria, polydipsia. Musculoskeletal: Denies myalgias, back pain, joint swelling, arthralgias and gait problem.  Skin: Denies pallor, rash and wound.  Neurological: Denies dizziness, seizures, syncope, weakness, light-headedness, numbness and headaches.  Hematological: Denies adenopathy. Easy bruising, personal or family bleeding history  Psychiatric/Behavioral: Denies suicidal ideation, mood changes, confusion, nervousness, sleep disturbance and agitation    Physical Exam: Vitals:   07/10/19 1537  BP: 120/84  Pulse: 71  Temp: 97.6 F (36.4 C)  TempSrc: Temporal  SpO2: 96%  Weight: 194 lb (88 kg)    Body mass index is 29.94 kg/m.   Constitutional: NAD, calm, comfortable Eyes: PERRL, lids and conjunctivae  normal ENMT: Mucous membranes are moist.  Respiratory: clear to auscultation bilaterally, no wheezing, no crackles. Normal respiratory effort. No accessory muscle use.  Cardiovascular: Regular rate and rhythm, no murmurs / rubs / gallops. No extremity edema.  Neurologic: Grossly intact and nonfocal Psychiatric: Normal judgment and insight. Alert and oriented x 3. Normal mood.    Impression and Plan:  DM (diabetes mellitus), type 2 with peripheral vascular complications (HCC) -123456 has improved from 7.5-7.1 today. -Suspect will be given better next visit due to increase Trulicity dose.  Vitamin D deficiency -Repeat levels.  Hyperlipidemia associated with type 2 diabetes mellitus ([place]) -Last LDL was 78 in November he is on atorvastatin.  Hypertension, essential -Well-controlled on current regimen.  CAD (coronary artery disease), native coronary artery - (STEMI) LAD 99% Thrombosis - PCI with Xience eXpedition 3.0 mm x `18 mm (3.4 mm post) -Compensated, no chest pain, followed by cardiology. -On statin, Coreg, lisinopril.    Patient Instructions  -Nice seeing you today!!  -See you back in 3 months.     Lelon Frohlich, MD [facility]

## 2019-07-16 ENCOUNTER — Ambulatory Visit: Payer: BC Managed Care – PPO | Admitting: Adult Health

## 2019-07-18 ENCOUNTER — Other Ambulatory Visit (INDEPENDENT_AMBULATORY_CARE_PROVIDER_SITE_OTHER): Payer: BC Managed Care – PPO

## 2019-07-18 ENCOUNTER — Other Ambulatory Visit: Payer: Self-pay

## 2019-07-18 DIAGNOSIS — E559 Vitamin D deficiency, unspecified: Secondary | ICD-10-CM

## 2019-07-18 LAB — VITAMIN D 25 HYDROXY (VIT D DEFICIENCY, FRACTURES): Vit D, 25-Hydroxy: 68 ng/mL (ref 30–100)

## 2019-07-25 ENCOUNTER — Other Ambulatory Visit: Payer: Self-pay | Admitting: Internal Medicine

## 2019-08-14 ENCOUNTER — Other Ambulatory Visit: Payer: Self-pay

## 2019-08-14 ENCOUNTER — Ambulatory Visit (INDEPENDENT_AMBULATORY_CARE_PROVIDER_SITE_OTHER): Payer: BC Managed Care – PPO | Admitting: Cardiology

## 2019-08-14 ENCOUNTER — Encounter: Payer: Self-pay | Admitting: Cardiology

## 2019-08-14 VITALS — BP 130/70 | HR 93 | Temp 95.2°F | Ht 68.0 in | Wt 193.4 lb

## 2019-08-14 DIAGNOSIS — I2510 Atherosclerotic heart disease of native coronary artery without angina pectoris: Secondary | ICD-10-CM | POA: Diagnosis not present

## 2019-08-14 DIAGNOSIS — Z9861 Coronary angioplasty status: Secondary | ICD-10-CM

## 2019-08-14 DIAGNOSIS — I255 Ischemic cardiomyopathy: Secondary | ICD-10-CM | POA: Diagnosis not present

## 2019-08-14 DIAGNOSIS — I10 Essential (primary) hypertension: Secondary | ICD-10-CM

## 2019-08-14 DIAGNOSIS — E785 Hyperlipidemia, unspecified: Secondary | ICD-10-CM

## 2019-08-14 DIAGNOSIS — E1169 Type 2 diabetes mellitus with other specified complication: Secondary | ICD-10-CM

## 2019-08-14 DIAGNOSIS — I2102 ST elevation (STEMI) myocardial infarction involving left anterior descending coronary artery: Secondary | ICD-10-CM

## 2019-08-14 DIAGNOSIS — E1151 Type 2 diabetes mellitus with diabetic peripheral angiopathy without gangrene: Secondary | ICD-10-CM

## 2019-08-14 MED ORDER — NITROGLYCERIN 0.4 MG SL SUBL: 0.4000 mg | SUBLINGUAL_TABLET | SUBLINGUAL | 6 refills | Status: DC | PRN

## 2019-08-14 NOTE — Progress Notes (Signed)
Primary Care Provider: Isaac Bliss, Rayford Halsted, MD Cardiologist: No primary care provider on file. Electrophysiologist: None  Clinic Note: Chief Complaint  Patient presents with  . Follow-up    3-year  . Coronary Artery Disease    LAD PCI in setting of STEMI  . Fatigue    Low energy level.   HPI:    Matthew Prince is a 64 y.o. male with a PMH notable for anterior STEMI-LAD PCI in July 2014 below who presents today for what amounts to be a 3-year follow-up.  Matthew Prince was last seen in April 2018--doing relatively well.  Was in good spirits.  Only indicated that he would like to build do more exercise.  Just too tired at end of the day.  No other issues.  Discussed the possibility of increasing ACE inhibitor (this was eventually done by his PCP).  Was supposed to be followed up in 1 year.  He had been on Farxiga, but because of insurance issues, it was no longer covered.  He was therefore converted to glipizide back in 2019.  Hope was to get him back on a GLP-1 agonist or SGLT2 inhibitor. ->  Was eventually started on Trulicity after having similar issue with Jardiance (and Januvia).  Recent Hospitalizations: None  Reviewed  CV studies:    The following studies were reviewed today: (if available, images/films reviewed: From Epic Chart or Care Everywhere) . None:   Interval History:   Matthew Prince returns here today pretty much doing well cardiac standpoint.  He says that he just feels like he does not have energy and then the day to do things.  He is a little bit tired, denies any dyspnea or necessary exercise intolerance.  He indicates that he just does not have much energy he would like to have.  He is not had any heart failure symptoms or recurrent angina.  No rapid regular heartbeats. He has been on stable medications with no bleeding issues on Plavix.  Thankfully no longer on aspirin. Finally able to be stabilized on Trulicity. He had some PACs on his EKG, but he denies any  sensation of palpitations.  CV Review of Symptoms (Summary): no chest pain or dyspnea on exertion positive for - Just feeling tired and lack of energy overall. negative for - edema, irregular heartbeat, orthopnea, palpitations, paroxysmal nocturnal dyspnea, rapid heart rate, shortness of breath or Syncope/near syncope, TIA/amaurosis fugax, claudication  The patient does not have symptoms concerning for COVID-19 infection (fever, chills, cough, or new shortness of breath).  The patient is practicing social distancing & Masking.  He is scheduled to have his initial Covid vaccine on 19 March.  REVIEWED OF SYSTEMS   Review of Systems  Constitutional: Positive for malaise/fatigue (Just does not feel like doing as much as he would like to).  HENT: Negative for congestion and nosebleeds.   Respiratory: Negative for shortness of breath.   Cardiovascular: Negative for leg swelling.  Gastrointestinal: Negative for blood in stool.  Genitourinary: Negative for frequency and hematuria.  Musculoskeletal: Negative for back pain, falls and joint pain.  Neurological: Negative for headaches.  Psychiatric/Behavioral: Negative.    I have reviewed and (if needed) personally updated the patient's problem list, medications, allergies, past medical and surgical history, social and family history.   PAST MEDICAL HISTORY   Past Medical History:  Diagnosis Date  . Allergy   . BPV (benign positional vertigo) 10/2009  . CAD S/P percutaneous coronary angioplasty    PCI to LAD,  the DES  . Cardiomyopathy, ischemic - EF ~40-45% with distal Anterior - Antero& InferoApical hypokinesis. 11/29/2012   F/U Echo 02/2013: EF 50-55%. Mild anterior apical and apical lateral HK, Gr 1 DD  . Diabetes mellitus   . HISTORY OF: ST elevation myocardial infarction (STEMI) of inferior wall 11/29/2012    99% LAD occlusion - PCI with DES  . Hyperlipidemia   . Hypertension, essential   . Low back pain   . Presence of drug coated stent  in LAD coronary artery 11/29/2012   Xience eXpedition DES 3.0 mm x 18 mm - (3.4 mm)    PAST SURGICAL HISTORY   Past Surgical History:  Procedure Laterality Date  . LEFT HEART CATHETERIZATION WITH CORONARY ANGIOGRAM N/A 11/29/2012   Procedure: LEFT HEART CATHETERIZATION WITH CORONARY ANGIOGRAM;  Surgeon: Leonie Man, MD;  Location: [facility] CATH LAB;  Service: Cardiovascular;  Laterality: N/A;  . NECK SURGERY     resection benign tumor  . PERCUTANEOUS CORONARY STENT INTERVENTION (PCI-S) N/A 11/29/2012   Procedure: PERCUTANEOUS CORONARY STENT INTERVENTION (PCI-S);  Surgeon: Leonie Man, MD;  Location: [facility] CATH LAB;;; 99% thrombotic LAD occlusion - Xience Expedition 3.0 mm x 18 mm - post-dilated to 3.4 mm  . TRANSTHORACIC ECHOCARDIOGRAM  03/12/2013   Post MI: Low normal EF (50-55%), apical false tendon.  Mild HK of apical anterior and apical lateral wall.  Grade 1 diastolic dysfunction.    MEDICATIONS/ALLERGIES   Current Meds  Medication Sig  . atorvastatin (LIPITOR) 80 MG tablet TAKE 1 TABLET BY MOUTH EVERY DAY  . carvedilol (COREG) 12.5 MG tablet TAKE 1 TABLET BY MOUTH TWICE A DAY WITH MEALS  . clopidogrel (PLAVIX) 75 MG tablet TAKE 1 TABLET BY MOUTH EVERY DAY  . Dulaglutide (TRULICITY) 1.5 0000000 SOPN Inject 1.5 mg into the skin once a week.  Marland Kitchen glipiZIDE (GLUCOTROL XL) 10 MG 24 hr tablet Take 1 tablet (10 mg total) by mouth daily with breakfast.  . glucose blood test strip Check glucose level before each meal ONE TOUCH VERIO TEST STRIP  . lisinopril (ZESTRIL) 10 MG tablet TAKE 1 TABLET BY MOUTH EVERY DAY  . metFORMIN (GLUCOPHAGE) 1000 MG tablet TAKE 1 TABLET (1,000 MG TOTAL) BY MOUTH 2 (TWO) TIMES DAILY WITH A MEAL.  . nitroGLYCERIN (NITROSTAT) 0.4 MG SL tablet Place 1 tablet (0.4 mg total) under the tongue every 5 (five) minutes as needed for chest pain.  . [DISCONTINUED] nitroGLYCERIN (NITROSTAT) 0.4 MG SL tablet Place 1 tablet (0.4 mg total) under the tongue every 5 (five) minutes as  needed for chest pain.    Allergies  Allergen Reactions  . Hydromet [Hydrocodone-Homatropine]     Lip swelling with concomitant use with tramadol  . Iodine     REACTION: unspecified  . Ultram [Tramadol]     Lip swelling with concomitant use of Hytromet    SOCIAL HISTORY/FAMILY HISTORY   Reviewed in Epic:  Pertinent findings: No new change  OBJCTIVE -PE, EKG, labs   Wt Readings from Last 3 Encounters:  08/14/19 193 lb 6.4 oz (87.7 kg)  07/10/19 194 lb (88 kg)  04/04/19 188 lb 14.4 oz (85.7 kg)    Physical Exam: BP 130/70   Pulse 93   Temp (!) 95.2 F (35.1 C)   Ht 5\' 8"  (1.727 m)   Wt 193 lb 6.4 oz (87.7 kg)   SpO2 96%   BMI 29.41 kg/m  Physical Exam  Constitutional: He is oriented to person, place, and time. He appears well-developed  and well-nourished. No distress.  HENT:  Head: Normocephalic and atraumatic.  Neck: No hepatojugular reflux and no JVD present. Carotid bruit is not present.  Cardiovascular: Normal rate, regular rhythm, normal heart sounds and intact distal pulses.  Occasional extrasystoles are present. PMI is not displaced. Exam reveals no gallop and no friction rub.  No murmur heard. Pulmonary/Chest: Effort normal and breath sounds normal. No respiratory distress. He has no wheezes. He has no rales. He exhibits no tenderness.  Abdominal: Soft. Bowel sounds are normal. He exhibits no distension. There is no abdominal tenderness. There is no rebound.  Musculoskeletal:        General: No edema. Normal range of motion.     Cervical back: Normal range of motion and neck supple.  Neurological: He is alert and oriented to person, place, and time.  Psychiatric: His behavior is normal. Judgment and thought content normal.    Adult ECG Report  Rate: 93 ;  Rhythm: normal sinus rhythm, premature atrial contractions (PAC) and Cannot exclude inferior MI, age-indeterminate.  Otherwise normal axis, intervals and durations.;   Narrative Interpretation: Stable  EKG.  Recent Labs:   Lab Results  Component Value Date   CHOL 122 04/11/2019   HDL 28 (L) 04/11/2019   LDLCALC 78 04/11/2019   LDLDIRECT 58.0 07/31/2016   TRIG 77 04/11/2019   CHOLHDL 4.4 04/11/2019   Lab Results  Component Value Date   CREATININE 0.98 04/11/2019   BUN 11 04/11/2019   NA 138 04/11/2019   K 4.1 04/11/2019   CL 101 04/11/2019   CO2 28 04/11/2019   Lab Results  Component Value Date   TSH 2.75 04/11/2019    ASSESSMENT/PLAN    Problem List Items Addressed This Visit    History of: STEMI (ST elevation myocardial infarction), ant wall (Chronic)    Minimally reduced EF by echo with anterior wall motion dramality that goes along with his MI.  Has had a full recovery.  No further anginal or failure symptoms      Relevant Medications   nitroGLYCERIN (NITROSTAT) 0.4 MG SL tablet   CAD (coronary artery disease), native coronary artery - (STEMI) LAD 99% Thrombosis - PCI with Xience eXpedition 3.0 mm x `18 mm (3.4 mm post) (Chronic)    Really single-vessel disease with an LAD stent.  Well now with no angina.  He does have some fatigue.  He is on previous dose of beta-blocker, but with PACs I would not want to reduce that there is.   Plan: For now continue current meds-carvedilol and lisinopril at current doses, clopidogrel along with LAD stent as well as no bleeding. On statin plus triple meds for diabetes.      Relevant Medications   nitroGLYCERIN (NITROSTAT) 0.4 MG SL tablet   Other Relevant Orders   EKG 12-Lead (Completed)   Cardiomyopathy, ischemic - EF improved to 50 at 55% (near normal)) with distal Anterior - Antero& InferoApical hypokinesis. (Chronic)    Pretty much resolved completely post PCI.  No real anginal or heart failure symptoms.  Euvolemic on exam.      Relevant Medications   nitroGLYCERIN (NITROSTAT) 0.4 MG SL tablet   Hyperlipidemia associated with type 2 diabetes mellitus (HCC) - Primary (Chronic)    Tolerating high-dose atorvastatin,  but his last lipids were not as well-controlled as it previously had been.  I like to see the next labs will show.  If his LDLs were in the wrong direction, my inclination would be to allow for a  month-long statin holiday to see if this helps his fatigue.  We could then consider converting to different statin (rosuvastatin) plus Zetia.      Hypertension, essential (Chronic)    Blood pressure is right at the borderline for class I hypertension on current dose of carvedilol and lisinopril.  Again resting heart rate 93 and PACs were make me leery of backing off carvedilol dose.  However he does have some fatigue.  We may potentially need to back off daytime dose of carvedilol and then potentially increase lisinopril to 20.      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      Relevant Medications   nitroGLYCERIN (NITROSTAT) 0.4 MG SL tablet       COVID-19 Education: The signs and symptoms of COVID-19 were discussed with the patient and how to seek care for testing (follow up with PCP or arrange E-visit).   The importance of social distancing was discussed today.  I spent a total of 21 minutes with the patient. >  50% of the time was spent in direct patient consultation.  Additional time spent with chart review  / charting (studies, outside notes, etc): 10 Total Time: 31 min   Current medicines are reviewed at length with the patient today.  (+/- concerns) none for now   Patient Instructions / Medication Changes & Studies & Tests Ordered   Patient Instructions  Medication Instructions:   No changes *If you need a refill on your cardiac medications before your next appointment, please call your pharmacy*   Lab Work: Not needed   Testing/Procedures:  not needed  Follow-Up: At [facility], you and your  health needs are our priority.  As part of our continuing mission to provide you with exceptional heart care, we have created designated Provider Care Teams.  These Care Teams include your primary Cardiologist (physician) and Advanced Practice Providers (APPs -  Physician Assistants and Nurse Practitioners) who all work together to provide you with the care you need, when you need it.  We recommend signing up for the patient portal called "MyChart".  Sign up information is provided on this After Visit Summary.  MyChart is used to connect with patients for Virtual Visits (Telemedicine).  Patients are able to view lab/test results, encounter notes, upcoming appointments, etc.  Non-urgent messages can be sent to your provider as well.   To learn more about what you can do with MyChart, go to [email or web address].    Your next appointment:   12 month(s)  The format for your next appointment:   In Person  Provider:   Glenetta Hew, MD   Other Instructions     Studies Ordered:   Orders Placed This Encounter  Procedures  . EKG 12-Lead     Glenetta Hew, M.D., M.S. Interventional Cardiologist   Pager # [phone] Phone # [phone] [address] [place], [place] [address]   Thank you for choosing Heartcare at [facility]!!

## 2019-08-14 NOTE — Patient Instructions (Signed)
Medication Instructions:   No changes *If you need a refill on your cardiac medications before your next appointment, please call your pharmacy*   Lab Work: Not needed   Testing/Procedures:  not needed  Follow-Up: At [facility], you and your health needs are our priority.  As part of our continuing mission to provide you with exceptional heart care, we have created designated Provider Care Teams.  These Care Teams include your primary Cardiologist (physician) and Advanced Practice Providers (APPs -  Physician Assistants and Nurse Practitioners) who all work together to provide you with the care you need, when you need it.  We recommend signing up for the patient portal called "MyChart".  Sign up information is provided on this After Visit Summary.  MyChart is used to connect with patients for Virtual Visits (Telemedicine).  Patients are able to view lab/test results, encounter notes, upcoming appointments, etc.  Non-urgent messages can be sent to your provider as well.   To learn more about what you can do with MyChart, go to [email or web address].    Your next appointment:   12 month(s)  The format for your next appointment:   In Person  Provider:   Glenetta Hew, MD   Other Instructions

## 2019-08-19 ENCOUNTER — Encounter: Payer: Self-pay | Admitting: Cardiology

## 2019-08-19 NOTE — Assessment & Plan Note (Signed)
Blood pressure is right at the borderline for class I hypertension on current dose of carvedilol and lisinopril.  Again resting heart rate 93 and PACs were make me leery of backing off carvedilol dose.  However he does have some fatigue.  We may potentially need to back off daytime dose of carvedilol and then potentially increase lisinopril to 20.

## 2019-08-19 NOTE — Assessment & Plan Note (Signed)
Minimally reduced EF by echo with anterior wall motion dramality that goes along with his MI.  Has had a full recovery.  No further anginal or failure symptoms

## 2019-08-19 NOTE — Assessment & Plan Note (Signed)
Pretty much resolved completely post PCI.  No real anginal or heart failure symptoms.  Euvolemic on exam.

## 2019-08-19 NOTE — Assessment & Plan Note (Signed)
Thankfully, he is now on a stable dose of Trulicity.

## 2019-08-19 NOTE — Assessment & Plan Note (Signed)
Tolerating high-dose atorvastatin, but his last lipids were not as well-controlled as it previously had been.  I like to see the next labs will show.  If his LDLs were in the wrong direction, my inclination would be to allow for a month-long statin holiday to see if this helps his fatigue.  We could then consider converting to different statin (rosuvastatin) plus Zetia.

## 2019-08-19 NOTE — Assessment & Plan Note (Signed)
Really single-vessel disease with an LAD stent.  Well now with no angina.  He does have some fatigue.  He is on previous dose of beta-blocker, but with PACs I would not want to reduce that there is.   Plan: For now continue current meds-carvedilol and lisinopril at current doses, clopidogrel along with LAD stent as well as no bleeding. On statin plus triple meds for diabetes.

## 2019-09-17 ENCOUNTER — Other Ambulatory Visit: Payer: Self-pay | Admitting: Internal Medicine

## 2019-09-17 DIAGNOSIS — E118 Type 2 diabetes mellitus with unspecified complications: Secondary | ICD-10-CM

## 2019-09-17 DIAGNOSIS — Z0000 Encounter for general adult medical examination without abnormal findings: Secondary | ICD-10-CM

## 2019-10-09 ENCOUNTER — Encounter: Payer: Self-pay | Admitting: Internal Medicine

## 2019-10-09 ENCOUNTER — Ambulatory Visit (INDEPENDENT_AMBULATORY_CARE_PROVIDER_SITE_OTHER): Payer: BC Managed Care – PPO | Admitting: Internal Medicine

## 2019-10-09 ENCOUNTER — Other Ambulatory Visit: Payer: Self-pay

## 2019-10-09 VITALS — BP 120/70 | HR 87 | Temp 97.3°F | Wt 184.0 lb

## 2019-10-09 DIAGNOSIS — E1169 Type 2 diabetes mellitus with other specified complication: Secondary | ICD-10-CM | POA: Diagnosis not present

## 2019-10-09 DIAGNOSIS — E118 Type 2 diabetes mellitus with unspecified complications: Secondary | ICD-10-CM | POA: Diagnosis not present

## 2019-10-09 DIAGNOSIS — I10 Essential (primary) hypertension: Secondary | ICD-10-CM | POA: Diagnosis not present

## 2019-10-09 DIAGNOSIS — K4090 Unilateral inguinal hernia, without obstruction or gangrene, not specified as recurrent: Secondary | ICD-10-CM

## 2019-10-09 DIAGNOSIS — E1151 Type 2 diabetes mellitus with diabetic peripheral angiopathy without gangrene: Secondary | ICD-10-CM | POA: Diagnosis not present

## 2019-10-09 DIAGNOSIS — E785 Hyperlipidemia, unspecified: Secondary | ICD-10-CM

## 2019-10-09 DIAGNOSIS — I2510 Atherosclerotic heart disease of native coronary artery without angina pectoris: Secondary | ICD-10-CM

## 2019-10-09 DIAGNOSIS — Z9861 Coronary angioplasty status: Secondary | ICD-10-CM

## 2019-10-09 LAB — POCT GLYCOSYLATED HEMOGLOBIN (HGB A1C): Hemoglobin A1C: 7.1 % — AB (ref 4.0–5.6)

## 2019-10-09 NOTE — Patient Instructions (Signed)
-Nice seeing you today!!  -Lab work today; will notify you once results are available.  -See you back in 3 months.   Inguinal Hernia, Adult An inguinal hernia is when fat or your intestines push through a weak spot in a muscle where your leg meets your lower belly (groin). This causes a rounded lump (bulge). This kind of hernia could also be:  In your scrotum, if you are male.  In folds of skin around your vagina, if you are male. There are three types of inguinal hernias. These include:  Hernias that can be pushed back into the belly (are reducible). This type rarely causes pain.  Hernias that cannot be pushed back into the belly (are incarcerated).  Hernias that cannot be pushed back into the belly and lose their blood supply (are strangulated). This type needs emergency surgery. If you do not have symptoms, you may not need treatment. If you have symptoms or a large hernia, you may need surgery. Follow these instructions at home: Lifestyle  Do these things if told by your doctor so you do not have trouble pooping (constipation): ? Drink enough fluid to keep your pee (urine) pale yellow. ? Eat foods that have a lot of fiber. These include fresh fruits and vegetables, whole grains, and beans. ? Limit foods that are high in fat and processed sugars. These include foods that are fried or sweet. ? Take medicine for trouble pooping.  Avoid lifting heavy objects.  Avoid standing for long amounts of time.  Do not use any products that contain nicotine or tobacco. These include cigarettes and e-cigarettes. If you need help quitting, ask your doctor.  Stay at a healthy weight. General instructions  You may try to push your hernia in by very gently pressing on it when you are lying down. Do not try to force the bulge back in if it will not push in easily.  Watch your hernia for any changes in shape, size, or color. Tell your doctor if you see any changes.  Take over-the-counter  and prescription medicines only as told by your doctor.  Keep all follow-up visits as told by your doctor. This is important. Contact a doctor if:  You have a fever.  You have new symptoms.  Your symptoms get worse. Get help right away if:  The area where your leg meets your lower belly has: ? Pain that gets worse suddenly. ? A bulge that gets bigger suddenly, and it does not get smaller after that. ? A bulge that turns red or purple. ? A bulge that is painful when you touch it.  You are a man, and your scrotum: ? Suddenly feels painful. ? Suddenly changes in size.  You cannot push the hernia in by very gently pressing on it when you are lying down. Do not try to force the bulge back in if it will not push in easily.  You feel sick to your stomach (nauseous), and that feeling does not go away.  You throw up (vomit), and that keeps happening.  You have a fast heartbeat.  You cannot poop (have a bowel movement) or pass gas. These symptoms may be an emergency. Do not wait to see if the symptoms will go away. Get medical help right away. Call your local emergency services (911 in the [place] Summary  An inguinal hernia is when fat or your intestines push through a weak spot in a muscle where your leg meets your lower belly (groin). This causes a  rounded lump (bulge).  If you do not have symptoms, you may not need treatment. If you have symptoms or a large hernia, you may need surgery.  Avoid lifting heavy objects. Also avoid standing for long amounts of time.  Do not try to force the bulge back in if it will not push in easily. This information is not intended to replace advice given to you by your health care provider. Make sure you discuss any questions you have with your health care provider. Document Revised: 06/16/2017 Document Reviewed: 02/14/2017 Elsevier Patient Education  [place].

## 2019-10-09 NOTE — Progress Notes (Signed)
Established Patient Office Visit     This visit occurred during the SARS-CoV-2 public health emergency.  Safety protocols were in place, including screening questions prior to the visit, additional usage of staff PPE, and extensive cleaning of exam room while observing appropriate contact time as indicated for disinfecting solutions.    CC/Reason for Visit: 42-month follow-up chronic medical conditions  HPI: Matthew Prince is a 64 y.o. male who is coming in today for the above mentioned reasons. Past Medical History is significant for: Coronary artery disease status post ST elevated MI in 2014 with LAD stenting, benign essential hypertension that has been well controlled, history of type 2 diabetes, history of hyperlipidemia.  He has no complaints.  He has lost 9 pounds since his last visit.  He would like me to look at a "swelling" in his left groin.  Does not cause pain.   Past Medical/Surgical History: Past Medical History:  Diagnosis Date  . Allergy   . BPV (benign positional vertigo) 10/2009  . CAD S/P percutaneous coronary angioplasty    PCI to LAD, the DES  . Cardiomyopathy, ischemic - EF ~40-45% with distal Anterior - Antero& InferoApical hypokinesis. 11/29/2012   F/U Echo 02/2013: EF 50-55%. Mild anterior apical and apical lateral HK, Gr 1 DD  . Diabetes mellitus   . HISTORY OF: ST elevation myocardial infarction (STEMI) of inferior wall 11/29/2012    99% LAD occlusion - PCI with DES  . Hyperlipidemia   . Hypertension, essential   . Low back pain   . Presence of drug coated stent in LAD coronary artery 11/29/2012   Xience eXpedition DES 3.0 mm x 18 mm - (3.4 mm)    Past Surgical History:  Procedure Laterality Date  . LEFT HEART CATHETERIZATION WITH CORONARY ANGIOGRAM N/A 11/29/2012   Procedure: LEFT HEART CATHETERIZATION WITH CORONARY ANGIOGRAM;  Surgeon: Leonie Man, MD;  Location: [facility] CATH LAB;  Service: Cardiovascular;  Laterality: N/A;  . NECK SURGERY     resection  benign tumor  . PERCUTANEOUS CORONARY STENT INTERVENTION (PCI-S) N/A 11/29/2012   Procedure: PERCUTANEOUS CORONARY STENT INTERVENTION (PCI-S);  Surgeon: Leonie Man, MD;  Location: [facility] CATH LAB;;; 99% thrombotic LAD occlusion - Xience Expedition 3.0 mm x 18 mm - post-dilated to 3.4 mm  . TRANSTHORACIC ECHOCARDIOGRAM  03/12/2013   Post MI: Low normal EF (50-55%), apical false tendon.  Mild HK of apical anterior and apical lateral wall.  Grade 1 diastolic dysfunction.    Social History:  reports that he has never smoked. He has never used smokeless tobacco. He reports that he does not drink alcohol. No history on file for drug.  Allergies: Allergies  Allergen Reactions  . Hydromet [Hydrocodone-Homatropine]     Lip swelling with concomitant use with tramadol  . Iodine     REACTION: unspecified  . Ultram [Tramadol]     Lip swelling with concomitant use of Hytromet    Family History:  Family History  Problem Relation Age of Onset  . Healthy Unknown        Noncontributory; he is not very aware of his family's history.     Current Outpatient Medications:  .  atorvastatin (LIPITOR) 80 MG tablet, TAKE 1 TABLET BY MOUTH EVERY DAY, Disp: 90 tablet, Rfl: 1 .  carvedilol (COREG) 12.5 MG tablet, TAKE 1 TABLET BY MOUTH TWICE A DAY WITH MEALS, Disp: 180 tablet, Rfl: 1 .  clopidogrel (PLAVIX) 75 MG tablet, TAKE 1 TABLET BY MOUTH EVERY DAY,  Disp: 90 tablet, Rfl: 1 .  Dulaglutide (TRULICITY) 1.5 0000000 SOPN, Inject 1.5 mg into the skin once a week., Disp: 12 pen, Rfl: 1 .  glipiZIDE (GLUCOTROL XL) 10 MG 24 hr tablet, TAKE 1 TABLET (10 MG TOTAL) BY MOUTH DAILY WITH BREAKFAST., Disp: 90 tablet, Rfl: 1 .  glucose blood test strip, Check glucose level before each meal ONE TOUCH VERIO TEST STRIP, Disp: 100 each, Rfl: 12 .  lisinopril (ZESTRIL) 10 MG tablet, TAKE 1 TABLET BY MOUTH EVERY DAY, Disp: 90 tablet, Rfl: 1 .  metFORMIN (GLUCOPHAGE) 1000 MG tablet, TAKE 1 TABLET (1,000 MG TOTAL) BY MOUTH 2  (TWO) TIMES DAILY WITH A MEAL., Disp: 180 tablet, Rfl: 1 .  nitroGLYCERIN (NITROSTAT) 0.4 MG SL tablet, Place 1 tablet (0.4 mg total) under the tongue every 5 (five) minutes as needed for chest pain., Disp: 25 tablet, Rfl: 6  Review of Systems:  Constitutional: Denies fever, chills, diaphoresis, appetite change and fatigue.  HEENT: Denies photophobia, eye pain, redness, hearing loss, ear pain, congestion, sore throat, rhinorrhea, sneezing, mouth sores, trouble swallowing, neck pain, neck stiffness and tinnitus.   Respiratory: Denies SOB, DOE, cough, chest tightness,  and wheezing.   Cardiovascular: Denies chest pain, palpitations and leg swelling.  Gastrointestinal: Denies nausea, vomiting, abdominal pain, diarrhea, constipation, blood in stool and abdominal distention.  Genitourinary: Denies dysuria, urgency, frequency, hematuria, flank pain and difficulty urinating.  Endocrine: Denies: hot or cold intolerance, sweats, changes in hair or nails, polyuria, polydipsia. Musculoskeletal: Denies myalgias, back pain, joint swelling, arthralgias and gait problem.  Skin: Denies pallor, rash and wound.  Neurological: Denies dizziness, seizures, syncope, weakness, light-headedness, numbness and headaches.  Hematological: Denies adenopathy. Easy bruising, personal or family bleeding history  Psychiatric/Behavioral: Denies suicidal ideation, mood changes, confusion, nervousness, sleep disturbance and agitation    Physical Exam: Vitals:   10/09/19 1523  BP: 120/70  Pulse: 87  Temp: (!) 97.3 F (36.3 C)  TempSrc: Temporal  SpO2: 97%  Weight: 184 lb (83.5 kg)    Body mass index is 27.98 kg/m.   Constitutional: NAD, calm, comfortable Eyes: PERRL, lids and conjunctivae normal ENMT: Mucous membranes are moist. Respiratory: clear to auscultation bilaterally, no wheezing, no crackles. Normal respiratory effort. No accessory muscle use.  Cardiovascular: Regular rate and rhythm, no murmurs / rubs /  gallops. No extremity edema. 2+ pedal pulses. No carotid bruits.  Abdomen: Inguinal hernia on the left is present. Neurologic: Grossly intact and nonfocal psychiatric: Normal judgment and insight. Alert and oriented x 3. Normal mood.    Impression and Plan:  DM (diabetes mellitus), type 2 with peripheral vascular complications (HCC) -123456 is 7.1 today, continue weight loss, lifestyle modifications, continue glipizide, Metformin and Trulicity.  Hyperlipidemia associated with type 2 diabetes mellitus ([place]) -Followed by cardiology, last LDL was 78 in November 2020.  Hypertension, essential -Well-controlled  CAD (coronary artery disease), native coronary artery - (STEMI) LAD 99% Thrombosis - PCI with Xience eXpedition 3.0 mm x `18 mm (3.4 mm post) -Stable, no chest pain, followed by cardiology.  Left inguinal hernia -Observation for now, refer to surgery if becomes un-reducible or causes pain.    Patient Instructions  -Nice seeing you today!!  -Lab work today; will notify you once results are available.  -See you back in 3 months.   Inguinal Hernia, Adult An inguinal hernia is when fat or your intestines push through a weak spot in a muscle where your leg meets your lower belly (groin). This causes a rounded lump (  bulge). This kind of hernia could also be:  In your scrotum, if you are male.  In folds of skin around your vagina, if you are male. There are three types of inguinal hernias. These include:  Hernias that can be pushed back into the belly (are reducible). This type rarely causes pain.  Hernias that cannot be pushed back into the belly (are incarcerated).  Hernias that cannot be pushed back into the belly and lose their blood supply (are strangulated). This type needs emergency surgery. If you do not have symptoms, you may not need treatment. If you have symptoms or a large hernia, you may need surgery. Follow these instructions at home: Lifestyle  Do these  things if told by your doctor so you do not have trouble pooping (constipation): ? Drink enough fluid to keep your pee (urine) pale yellow. ? Eat foods that have a lot of fiber. These include fresh fruits and vegetables, whole grains, and beans. ? Limit foods that are high in fat and processed sugars. These include foods that are fried or sweet. ? Take medicine for trouble pooping.  Avoid lifting heavy objects.  Avoid standing for long amounts of time.  Do not use any products that contain nicotine or tobacco. These include cigarettes and e-cigarettes. If you need help quitting, ask your doctor.  Stay at a healthy weight. General instructions  You may try to push your hernia in by very gently pressing on it when you are lying down. Do not try to force the bulge back in if it will not push in easily.  Watch your hernia for any changes in shape, size, or color. Tell your doctor if you see any changes.  Take over-the-counter and prescription medicines only as told by your doctor.  Keep all follow-up visits as told by your doctor. This is important. Contact a doctor if:  You have a fever.  You have new symptoms.  Your symptoms get worse. Get help right away if:  The area where your leg meets your lower belly has: ? Pain that gets worse suddenly. ? A bulge that gets bigger suddenly, and it does not get smaller after that. ? A bulge that turns red or purple. ? A bulge that is painful when you touch it.  You are a man, and your scrotum: ? Suddenly feels painful. ? Suddenly changes in size.  You cannot push the hernia in by very gently pressing on it when you are lying down. Do not try to force the bulge back in if it will not push in easily.  You feel sick to your stomach (nauseous), and that feeling does not go away.  You throw up (vomit), and that keeps happening.  You have a fast heartbeat.  You cannot poop (have a bowel movement) or pass gas. These symptoms may be an  emergency. Do not wait to see if the symptoms will go away. Get medical help right away. Call your local emergency services (911 in the [place] Summary  An inguinal hernia is when fat or your intestines push through a weak spot in a muscle where your leg meets your lower belly (groin). This causes a rounded lump (bulge).  If you do not have symptoms, you may not need treatment. If you have symptoms or a large hernia, you may need surgery.  Avoid lifting heavy objects. Also avoid standing for long amounts of time.  Do not try to force the bulge back in if it will not push in  easily. This information is not intended to replace advice given to you by your health care provider. Make sure you discuss any questions you have with your health care provider. Document Revised: 06/16/2017 Document Reviewed: 02/14/2017 Elsevier Patient Education  2020 [place], MD [facility]

## 2019-12-13 ENCOUNTER — Other Ambulatory Visit: Payer: Self-pay | Admitting: Internal Medicine

## 2019-12-13 DIAGNOSIS — E118 Type 2 diabetes mellitus with unspecified complications: Secondary | ICD-10-CM

## 2020-01-09 ENCOUNTER — Ambulatory Visit (INDEPENDENT_AMBULATORY_CARE_PROVIDER_SITE_OTHER): Payer: BC Managed Care – PPO | Admitting: Internal Medicine

## 2020-01-09 ENCOUNTER — Encounter: Payer: Self-pay | Admitting: Internal Medicine

## 2020-01-09 ENCOUNTER — Other Ambulatory Visit: Payer: Self-pay

## 2020-01-09 VITALS — BP 120/78 | HR 73 | Temp 98.0°F | Wt 190.8 lb

## 2020-01-09 DIAGNOSIS — E118 Type 2 diabetes mellitus with unspecified complications: Secondary | ICD-10-CM

## 2020-01-09 DIAGNOSIS — E559 Vitamin D deficiency, unspecified: Secondary | ICD-10-CM | POA: Diagnosis not present

## 2020-01-09 DIAGNOSIS — I2510 Atherosclerotic heart disease of native coronary artery without angina pectoris: Secondary | ICD-10-CM | POA: Diagnosis not present

## 2020-01-09 DIAGNOSIS — I10 Essential (primary) hypertension: Secondary | ICD-10-CM

## 2020-01-09 DIAGNOSIS — E785 Hyperlipidemia, unspecified: Secondary | ICD-10-CM

## 2020-01-09 DIAGNOSIS — Z9861 Coronary angioplasty status: Secondary | ICD-10-CM

## 2020-01-09 DIAGNOSIS — E1169 Type 2 diabetes mellitus with other specified complication: Secondary | ICD-10-CM

## 2020-01-09 LAB — POCT GLYCOSYLATED HEMOGLOBIN (HGB A1C): Hemoglobin A1C: 7.5 % — AB (ref 4.0–5.6)

## 2020-01-09 NOTE — Progress Notes (Signed)
Established Patient Office Visit     This visit occurred during the SARS-CoV-2 public health emergency.  Safety protocols were in place, including screening questions prior to the visit, additional usage of staff PPE, and extensive cleaning of exam room while observing appropriate contact time as indicated for disinfecting solutions.    CC/Reason for Visit: 36-month follow-up chronic medical conditions  HPI: Matthew Prince is a 64 y.o. male who is coming in today for the above mentioned reasons. Past Medical History is significant for:  Coronary artery disease status post ST elevated MI in 2014 with LAD stenting, benign essential hypertension that has been well controlled, history of type 2 diabetes, history of hyperlipidemia.  He has no acute complaints today.  He is here mainly to follow-up his diabetes.  We have done a food diary and he is still eating a lot of starchy foods including rice and mangoes every day, corn and potatoes almost every day as well.   Past Medical/Surgical History: Past Medical History:  Diagnosis Date  . Allergy   . BPV (benign positional vertigo) 10/2009  . CAD S/P percutaneous coronary angioplasty    PCI to LAD, the DES  . Cardiomyopathy, ischemic - EF ~40-45% with distal Anterior - Antero& InferoApical hypokinesis. 11/29/2012   F/U Echo 02/2013: EF 50-55%. Mild anterior apical and apical lateral HK, Gr 1 DD  . Diabetes mellitus   . HISTORY OF: ST elevation myocardial infarction (STEMI) of inferior wall 11/29/2012    99% LAD occlusion - PCI with DES  . Hyperlipidemia   . Hypertension, essential   . Low back pain   . Presence of drug coated stent in LAD coronary artery 11/29/2012   Xience eXpedition DES 3.0 mm x 18 mm - (3.4 mm)    Past Surgical History:  Procedure Laterality Date  . LEFT HEART CATHETERIZATION WITH CORONARY ANGIOGRAM N/A 11/29/2012   Procedure: LEFT HEART CATHETERIZATION WITH CORONARY ANGIOGRAM;  Surgeon: Leonie Man, MD;  Location: [facility]  CATH LAB;  Service: Cardiovascular;  Laterality: N/A;  . NECK SURGERY     resection benign tumor  . PERCUTANEOUS CORONARY STENT INTERVENTION (PCI-S) N/A 11/29/2012   Procedure: PERCUTANEOUS CORONARY STENT INTERVENTION (PCI-S);  Surgeon: Leonie Man, MD;  Location: [facility] CATH LAB;;; 99% thrombotic LAD occlusion - Xience Expedition 3.0 mm x 18 mm - post-dilated to 3.4 mm  . TRANSTHORACIC ECHOCARDIOGRAM  03/12/2013   Post MI: Low normal EF (50-55%), apical false tendon.  Mild HK of apical anterior and apical lateral wall.  Grade 1 diastolic dysfunction.    Social History:  reports that he has never smoked. He has never used smokeless tobacco. He reports that he does not drink alcohol. No history on file for drug use.  Allergies: Allergies  Allergen Reactions  . Hydromet [Hydrocodone-Homatropine]     Lip swelling with concomitant use with tramadol  . Iodine     REACTION: unspecified  . Ultram [Tramadol]     Lip swelling with concomitant use of Hytromet    Family History:  Family History  Problem Relation Age of Onset  . Healthy Unknown        Noncontributory; he is not very aware of his family's history.     Current Outpatient Medications:  .  atorvastatin (LIPITOR) 80 MG tablet, TAKE 1 TABLET BY MOUTH EVERY DAY, Disp: 90 tablet, Rfl: 0 .  carvedilol (COREG) 12.5 MG tablet, TAKE 1 TABLET BY MOUTH TWICE A DAY WITH MEALS, Disp: 180 tablet, Rfl:  1 .  clopidogrel (PLAVIX) 75 MG tablet, TAKE 1 TABLET BY MOUTH EVERY DAY, Disp: 90 tablet, Rfl: 0 .  glipiZIDE (GLUCOTROL XL) 10 MG 24 hr tablet, TAKE 1 TABLET (10 MG TOTAL) BY MOUTH DAILY WITH BREAKFAST., Disp: 90 tablet, Rfl: 1 .  glucose blood test strip, Check glucose level before each meal ONE TOUCH VERIO TEST STRIP, Disp: 100 each, Rfl: 12 .  lisinopril (ZESTRIL) 10 MG tablet, TAKE 1 TABLET BY MOUTH EVERY DAY, Disp: 90 tablet, Rfl: 1 .  metFORMIN (GLUCOPHAGE) 1000 MG tablet, TAKE 1 TABLET (1,000 MG TOTAL) BY MOUTH 2 (TWO) TIMES DAILY WITH  A MEAL., Disp: 180 tablet, Rfl: 1 .  nitroGLYCERIN (NITROSTAT) 0.4 MG SL tablet, Place 1 tablet (0.4 mg total) under the tongue every 5 (five) minutes as needed for chest pain., Disp: 25 tablet, Rfl: 6 .  TRULICITY 1.5 GM/0.1UU SOPN, INJECT 1.5MG  INTO SKIN ONCE A WEEK, Disp: 12 pen, Rfl: 1  Review of Systems:  Constitutional: Denies fever, chills, diaphoresis, appetite change and fatigue.  HEENT: Denies photophobia, eye pain, redness, hearing loss, ear pain, congestion, sore throat, rhinorrhea, sneezing, mouth sores, trouble swallowing, neck pain, neck stiffness and tinnitus.   Respiratory: Denies SOB, DOE, cough, chest tightness,  and wheezing.   Cardiovascular: Denies chest pain, palpitations and leg swelling.  Gastrointestinal: Denies nausea, vomiting, abdominal pain, diarrhea, constipation, blood in stool and abdominal distention.  Genitourinary: Denies dysuria, urgency, frequency, hematuria, flank pain and difficulty urinating.  Endocrine: Denies: hot or cold intolerance, sweats, changes in hair or nails, polyuria, polydipsia. Musculoskeletal: Denies myalgias, back pain, joint swelling, arthralgias and gait problem.  Skin: Denies pallor, rash and wound.  Neurological: Denies dizziness, seizures, syncope, weakness, light-headedness, numbness and headaches.  Hematological: Denies adenopathy. Easy bruising, personal or family bleeding history  Psychiatric/Behavioral: Denies suicidal ideation, mood changes, confusion, nervousness, sleep disturbance and agitation    Physical Exam: Vitals:   01/09/20 1502  BP: 120/78  Pulse: 73  Temp: 98 F (36.7 C)  TempSrc: Oral  SpO2: 97%  Weight: 190 lb 12.8 oz (86.5 kg)    Body mass index is 29.01 kg/m.   Constitutional: NAD, calm, comfortable Eyes: PERRL, lids and conjunctivae normal ENMT: Mucous membranes are moist.  Respiratory: clear to auscultation bilaterally, no wheezing, no crackles. Normal respiratory effort. No accessory muscle  use.  Cardiovascular: Regular rate and rhythm, no murmurs / rubs / gallops. No extremity edema.  Neurologic: Grossly intact and nonfocal Psychiatric: Normal judgment and insight. Alert and oriented x 3. Normal mood.    Impression and Plan:  Diabetes mellitus with complication ([place]) -V2Z has increased to 7.5, he is compliant with medications but after food diary review he has been eating too many starchy foods. -We have spent a large amount of time discussing this, he will try to work on lifestyle modifications and return in 3 months for follow-up.  Vitamin D deficiency -He has done several rounds of high-dose supplementation, recheck vitamin D when he returns.  Hypertension, essential -Well-controlled.  CAD (coronary artery disease), native coronary artery - (STEMI) LAD 99% Thrombosis - PCI with Xience eXpedition 3.0 mm x `18 mm (3.4 mm post) -Follows with cardiology.  Hyperlipidemia associated with type 2 diabetes mellitus ([place]) -Last LDL was 78 in November 2020.     Lelon Frohlich, MD [facility]

## 2020-03-07 ENCOUNTER — Other Ambulatory Visit: Payer: Self-pay | Admitting: Internal Medicine

## 2020-03-07 DIAGNOSIS — E118 Type 2 diabetes mellitus with unspecified complications: Secondary | ICD-10-CM

## 2020-03-07 DIAGNOSIS — Z0000 Encounter for general adult medical examination without abnormal findings: Secondary | ICD-10-CM

## 2020-03-13 ENCOUNTER — Other Ambulatory Visit: Payer: Self-pay | Admitting: Internal Medicine

## 2020-03-16 ENCOUNTER — Other Ambulatory Visit: Payer: Self-pay | Admitting: Internal Medicine

## 2020-04-13 ENCOUNTER — Other Ambulatory Visit: Payer: Self-pay

## 2020-04-13 ENCOUNTER — Encounter: Payer: Self-pay | Admitting: Internal Medicine

## 2020-04-13 ENCOUNTER — Ambulatory Visit (INDEPENDENT_AMBULATORY_CARE_PROVIDER_SITE_OTHER): Payer: BC Managed Care – PPO | Admitting: Internal Medicine

## 2020-04-13 VITALS — BP 120/70 | HR 76 | Temp 98.0°F | Wt 192.1 lb

## 2020-04-13 DIAGNOSIS — E559 Vitamin D deficiency, unspecified: Secondary | ICD-10-CM

## 2020-04-13 DIAGNOSIS — E785 Hyperlipidemia, unspecified: Secondary | ICD-10-CM

## 2020-04-13 DIAGNOSIS — I10 Essential (primary) hypertension: Secondary | ICD-10-CM

## 2020-04-13 DIAGNOSIS — E1151 Type 2 diabetes mellitus with diabetic peripheral angiopathy without gangrene: Secondary | ICD-10-CM

## 2020-04-13 DIAGNOSIS — E118 Type 2 diabetes mellitus with unspecified complications: Secondary | ICD-10-CM

## 2020-04-13 DIAGNOSIS — Z9861 Coronary angioplasty status: Secondary | ICD-10-CM

## 2020-04-13 DIAGNOSIS — I2510 Atherosclerotic heart disease of native coronary artery without angina pectoris: Secondary | ICD-10-CM | POA: Diagnosis not present

## 2020-04-13 DIAGNOSIS — I2102 ST elevation (STEMI) myocardial infarction involving left anterior descending coronary artery: Secondary | ICD-10-CM

## 2020-04-13 DIAGNOSIS — E1169 Type 2 diabetes mellitus with other specified complication: Secondary | ICD-10-CM

## 2020-04-13 LAB — POCT GLYCOSYLATED HEMOGLOBIN (HGB A1C): Hemoglobin A1C: 8.2 % — AB (ref 4.0–5.6)

## 2020-04-13 NOTE — Progress Notes (Signed)
Established Patient Office Visit     This visit occurred during the SARS-CoV-2 public health emergency.  Safety protocols were in place, including screening questions prior to the visit, additional usage of staff PPE, and extensive cleaning of exam room while observing appropriate contact time as indicated for disinfecting solutions.    CC/Reason for Visit: 3 month follow up chronic medical conditions  HPI: Matthew Prince is a 64 y.o. male who is coming in today for the above mentioned reasons. Past Medical History is significant for: Coronary artery disease status post ST elevated MI in 2014 with LAD stenting, benign essential hypertension that has been well controlled, history of type 2 diabetes, history of hyperlipidemia, h/o vit D deficiency.  He has no acute complaints today.  He is here mainly to follow-up his diabetes. He notes his CBGs have been running high. He has been out of his Trulicity for the past 4 weeks. Just now received a 90 day supply. He has improved his diet.   Past Medical/Surgical History: Past Medical History:  Diagnosis Date  . Allergy   . BPV (benign positional vertigo) 10/2009  . CAD S/P percutaneous coronary angioplasty    PCI to LAD, the DES  . Cardiomyopathy, ischemic - EF ~40-45% with distal Anterior - Antero& InferoApical hypokinesis. 11/29/2012   F/U Echo 02/2013: EF 50-55%. Mild anterior apical and apical lateral HK, Gr 1 DD  . Diabetes mellitus   . HISTORY OF: ST elevation myocardial infarction (STEMI) of inferior wall 11/29/2012    99% LAD occlusion - PCI with DES  . Hyperlipidemia   . Hypertension, essential   . Low back pain   . Presence of drug coated stent in LAD coronary artery 11/29/2012   Xience eXpedition DES 3.0 mm x 18 mm - (3.4 mm)    Past Surgical History:  Procedure Laterality Date  . LEFT HEART CATHETERIZATION WITH CORONARY ANGIOGRAM N/A 11/29/2012   Procedure: LEFT HEART CATHETERIZATION WITH CORONARY ANGIOGRAM;  Surgeon: Leonie Man, MD;  Location: [facility] CATH LAB;  Service: Cardiovascular;  Laterality: N/A;  . NECK SURGERY     resection benign tumor  . PERCUTANEOUS CORONARY STENT INTERVENTION (PCI-S) N/A 11/29/2012   Procedure: PERCUTANEOUS CORONARY STENT INTERVENTION (PCI-S);  Surgeon: Leonie Man, MD;  Location: [facility] CATH LAB;;; 99% thrombotic LAD occlusion - Xience Expedition 3.0 mm x 18 mm - post-dilated to 3.4 mm  . TRANSTHORACIC ECHOCARDIOGRAM  03/12/2013   Post MI: Low normal EF (50-55%), apical false tendon.  Mild HK of apical anterior and apical lateral wall.  Grade 1 diastolic dysfunction.    Social History:  reports that he has never smoked. He has never used smokeless tobacco. He reports that he does not drink alcohol. No history on file for drug use.  Allergies: Allergies  Allergen Reactions  . Hydromet [Hydrocodone-Homatropine]     Lip swelling with concomitant use with tramadol  . Iodine     REACTION: unspecified  . Ultram [Tramadol]     Lip swelling with concomitant use of Hytromet    Family History:  Family History  Problem Relation Age of Onset  . Healthy Unknown        Noncontributory; he is not very aware of his family's history.     Current Outpatient Medications:  .  atorvastatin (LIPITOR) 80 MG tablet, TAKE 1 TABLET BY MOUTH EVERY DAY, Disp: 90 tablet, Rfl: 0 .  carvedilol (COREG) 12.5 MG tablet, TAKE 1 TABLET BY MOUTH TWICE A DAY  WITH MEALS, Disp: 180 tablet, Rfl: 1 .  clopidogrel (PLAVIX) 75 MG tablet, TAKE 1 TABLET BY MOUTH EVERY DAY, Disp: 90 tablet, Rfl: 0 .  glipiZIDE (GLUCOTROL XL) 10 MG 24 hr tablet, TAKE 1 TABLET (10 MG TOTAL) BY MOUTH DAILY WITH BREAKFAST., Disp: 90 tablet, Rfl: 1 .  glucose blood test strip, Check glucose level before each meal ONE TOUCH VERIO TEST STRIP, Disp: 100 each, Rfl: 12 .  lisinopril (ZESTRIL) 10 MG tablet, TAKE 1 TABLET BY MOUTH EVERY DAY, Disp: 90 tablet, Rfl: 1 .  metFORMIN (GLUCOPHAGE) 1000 MG tablet, TAKE 1 TABLET BY MOUTH TWICE A DAY  WITH A MEAL, Disp: 180 tablet, Rfl: 1 .  nitroGLYCERIN (NITROSTAT) 0.4 MG SL tablet, Place 1 tablet (0.4 mg total) under the tongue every 5 (five) minutes as needed for chest pain., Disp: 25 tablet, Rfl: 6 .  TRULICITY 1.5 GO/1.1XB SOPN, INJECT 1.5MG  INTO SKIN ONCE A WEEK, Disp: 12 pen, Rfl: 1  Review of Systems:  Constitutional: Denies fever, chills, diaphoresis, appetite change and fatigue.  HEENT: Denies photophobia, eye pain, redness, hearing loss, ear pain, congestion, sore throat, rhinorrhea, sneezing, mouth sores, trouble swallowing, neck pain, neck stiffness and tinnitus.   Respiratory: Denies SOB, DOE, cough, chest tightness,  and wheezing.   Cardiovascular: Denies chest pain, palpitations and leg swelling.  Gastrointestinal: Denies nausea, vomiting, abdominal pain, diarrhea, constipation, blood in stool and abdominal distention.  Genitourinary: Denies dysuria, urgency, frequency, hematuria, flank pain and difficulty urinating.  Endocrine: Denies: hot or cold intolerance, sweats, changes in hair or nails, polyuria, polydipsia. Musculoskeletal: Denies myalgias, back pain, joint swelling, arthralgias and gait problem.  Skin: Denies pallor, rash and wound.  Neurological: Denies dizziness, seizures, syncope, weakness, light-headedness, numbness and headaches.  Hematological: Denies adenopathy. Easy bruising, personal or family bleeding history  Psychiatric/Behavioral: Denies suicidal ideation, mood changes, confusion, nervousness, sleep disturbance and agitation    Physical Exam: Vitals:   04/13/20 1506  BP: 120/70  Pulse: 76  Temp: 98 F (36.7 C)  TempSrc: Oral  SpO2: 98%  Weight: 192 lb 1.6 oz (87.1 kg)    Body mass index is 29.21 kg/m.   Constitutional: NAD, calm, comfortable Eyes: PERRL, lids and conjunctivae normal ENMT: Mucous membranes are moist. Respiratory: clear to auscultation bilaterally, no wheezing, no crackles. Normal respiratory effort. No accessory muscle  use.  Cardiovascular: Regular rate and rhythm, no murmurs / rubs / gallops. No extremity edema.  Neurologic: grossly intact and non-focal Psychiatric: Normal judgment and insight. Alert and oriented x 3. Normal mood.    Impression and Plan:  DM (diabetes mellitus), type 2 with peripheral vascular complications (HCC) -W6O increased to 8.2 from 7.5 3 months ago. -He has been out of his trulicity for 4 weeks; resume at 1.5 dose weekly and return in 3 months for follow up. -Lifestyle changes are encouraged and discussed.  Hypertension, essential -Well controlled.  CAD (coronary artery disease), native coronary artery - (STEMI) LAD 99% Thrombosis - PCI with Xience eXpedition 3.0 mm x `18 mm (3.4 mm ost) ST elevation myocardial infarction involving left anterior descending (LAD) coronary artery (HCC) -Stable, followed by cardiology.  Hyperlipidemia associated with type 2 diabetes mellitus ([place]) -Last LDL 78 in 2020. -on atorvastatin.  Vitamin D deficiency -Improved at last check.    Patient Instructions  -Nice seeing you today!!  -Schedule follow up in 3 months.     Lelon Frohlich, MD [facility]

## 2020-04-13 NOTE — Patient Instructions (Signed)
-  Nice seeing you today!!  -Schedule follow up in 3 months. 

## 2020-06-19 ENCOUNTER — Other Ambulatory Visit: Payer: Self-pay | Admitting: Internal Medicine

## 2020-07-13 ENCOUNTER — Other Ambulatory Visit: Payer: Self-pay

## 2020-07-14 ENCOUNTER — Encounter: Payer: Self-pay | Admitting: Internal Medicine

## 2020-07-14 ENCOUNTER — Ambulatory Visit (INDEPENDENT_AMBULATORY_CARE_PROVIDER_SITE_OTHER): Payer: BC Managed Care – PPO | Admitting: Internal Medicine

## 2020-07-14 VITALS — BP 120/78 | HR 77 | Temp 97.9°F | Wt 191.6 lb

## 2020-07-14 DIAGNOSIS — I10 Essential (primary) hypertension: Secondary | ICD-10-CM | POA: Diagnosis not present

## 2020-07-14 DIAGNOSIS — E118 Type 2 diabetes mellitus with unspecified complications: Secondary | ICD-10-CM

## 2020-07-14 DIAGNOSIS — E559 Vitamin D deficiency, unspecified: Secondary | ICD-10-CM | POA: Diagnosis not present

## 2020-07-14 DIAGNOSIS — I2510 Atherosclerotic heart disease of native coronary artery without angina pectoris: Secondary | ICD-10-CM

## 2020-07-14 DIAGNOSIS — E1169 Type 2 diabetes mellitus with other specified complication: Secondary | ICD-10-CM

## 2020-07-14 DIAGNOSIS — Z9861 Coronary angioplasty status: Secondary | ICD-10-CM

## 2020-07-14 DIAGNOSIS — E785 Hyperlipidemia, unspecified: Secondary | ICD-10-CM

## 2020-07-14 LAB — POCT GLYCOSYLATED HEMOGLOBIN (HGB A1C): Hemoglobin A1C: 7.6 % — AB (ref 4.0–5.6)

## 2020-07-14 NOTE — Progress Notes (Signed)
Established Patient Office Visit     This visit occurred during the SARS-CoV-2 public health emergency.  Safety protocols were in place, including screening questions prior to the visit, additional usage of staff PPE, and extensive cleaning of exam room while observing appropriate contact time as indicated for disinfecting solutions.    CC/Reason for Visit: 29-month follow-up chronic medical conditions  HPI: Matthew Prince is a 65 y.o. male who is coming in today for the above mentioned reasons. Past Medical History is significant for: Coronary artery disease status post ST elevated MI in 2014 with LAD stenting, benign essential hypertension that has been well controlled, history of type 2 diabetes, history of hyperlipidemia, h/o vit D deficiency.He has no acute complaints today.  He believes his diabetes is improved, he admits that he still has some dietary indiscretions.  His last A1c was 8.2 in November.   Past Medical/Surgical History: Past Medical History:  Diagnosis Date  . Allergy   . BPV (benign positional vertigo) 10/2009  . CAD S/P percutaneous coronary angioplasty    PCI to LAD, the DES  . Cardiomyopathy, ischemic - EF ~40-45% with distal Anterior - Antero& InferoApical hypokinesis. 11/29/2012   F/U Echo 02/2013: EF 50-55%. Mild anterior apical and apical lateral HK, Gr 1 DD  . Diabetes mellitus   . HISTORY OF: ST elevation myocardial infarction (STEMI) of inferior wall 11/29/2012    99% LAD occlusion - PCI with DES  . Hyperlipidemia   . Hypertension, essential   . Low back pain   . Presence of drug coated stent in LAD coronary artery 11/29/2012   Xience eXpedition DES 3.0 mm x 18 mm - (3.4 mm)    Past Surgical History:  Procedure Laterality Date  . LEFT HEART CATHETERIZATION WITH CORONARY ANGIOGRAM N/A 11/29/2012   Procedure: LEFT HEART CATHETERIZATION WITH CORONARY ANGIOGRAM;  Surgeon: Leonie Man, MD;  Location: [facility] CATH LAB;  Service: Cardiovascular;  Laterality: N/A;   . NECK SURGERY     resection benign tumor  . PERCUTANEOUS CORONARY STENT INTERVENTION (PCI-S) N/A 11/29/2012   Procedure: PERCUTANEOUS CORONARY STENT INTERVENTION (PCI-S);  Surgeon: Leonie Man, MD;  Location: [facility] CATH LAB;;; 99% thrombotic LAD occlusion - Xience Expedition 3.0 mm x 18 mm - post-dilated to 3.4 mm  . TRANSTHORACIC ECHOCARDIOGRAM  03/12/2013   Post MI: Low normal EF (50-55%), apical false tendon.  Mild HK of apical anterior and apical lateral wall.  Grade 1 diastolic dysfunction.    Social History:  reports that he has never smoked. He has never used smokeless tobacco. He reports that he does not drink alcohol. No history on file for drug use.  Allergies: Allergies  Allergen Reactions  . Hydromet [Hydrocodone-Homatropine]     Lip swelling with concomitant use with tramadol  . Iodine     REACTION: unspecified  . Ultram [Tramadol]     Lip swelling with concomitant use of Hytromet    Family History:  Family History  Problem Relation Age of Onset  . Healthy Other        Noncontributory; he is not very aware of his family's history.     Current Outpatient Medications:  .  atorvastatin (LIPITOR) 80 MG tablet, TAKE 1 TABLET BY MOUTH EVERY DAY, Disp: 90 tablet, Rfl: 0 .  carvedilol (COREG) 12.5 MG tablet, TAKE 1 TABLET BY MOUTH TWICE A DAY WITH MEALS, Disp: 180 tablet, Rfl: 1 .  clopidogrel (PLAVIX) 75 MG tablet, TAKE 1 TABLET BY MOUTH EVERY DAY,  Disp: 90 tablet, Rfl: 0 .  glipiZIDE (GLUCOTROL XL) 10 MG 24 hr tablet, TAKE 1 TABLET (10 MG TOTAL) BY MOUTH DAILY WITH BREAKFAST., Disp: 90 tablet, Rfl: 1 .  glucose blood test strip, Check glucose level before each meal ONE TOUCH VERIO TEST STRIP, Disp: 100 each, Rfl: 12 .  lisinopril (ZESTRIL) 10 MG tablet, TAKE 1 TABLET BY MOUTH EVERY DAY, Disp: 90 tablet, Rfl: 1 .  metFORMIN (GLUCOPHAGE) 1000 MG tablet, TAKE 1 TABLET BY MOUTH TWICE A DAY WITH A MEAL, Disp: 180 tablet, Rfl: 1 .  nitroGLYCERIN (NITROSTAT) 0.4 MG SL tablet,  Place 1 tablet (0.4 mg total) under the tongue every 5 (five) minutes as needed for chest pain., Disp: 25 tablet, Rfl: 6 .  TRULICITY 1.5 EB/5.8XE SOPN, INJECT 1.5MG  INTO SKIN ONCE A WEEK, Disp: 12 pen, Rfl: 1  Review of Systems:  Constitutional: Denies fever, chills, diaphoresis, appetite change and fatigue.  HEENT: Denies photophobia, eye pain, redness, hearing loss, ear pain, congestion, sore throat, rhinorrhea, sneezing, mouth sores, trouble swallowing, neck pain, neck stiffness and tinnitus.   Respiratory: Denies SOB, DOE, cough, chest tightness,  and wheezing.   Cardiovascular: Denies chest pain, palpitations and leg swelling.  Gastrointestinal: Denies nausea, vomiting, abdominal pain, diarrhea, constipation, blood in stool and abdominal distention.  Genitourinary: Denies dysuria, urgency, frequency, hematuria, flank pain and difficulty urinating.  Endocrine: Denies: hot or cold intolerance, sweats, changes in hair or nails, polyuria, polydipsia. Musculoskeletal: Denies myalgias, back pain, joint swelling, arthralgias and gait problem.  Skin: Denies pallor, rash and wound.  Neurological: Denies dizziness, seizures, syncope, weakness, light-headedness, numbness and headaches.  Hematological: Denies adenopathy. Easy bruising, personal or family bleeding history  Psychiatric/Behavioral: Denies suicidal ideation, mood changes, confusion, nervousness, sleep disturbance and agitation    Physical Exam: Vitals:   07/14/20 1416  BP: 120/78  Pulse: 77  Temp: 97.9 F (36.6 C)  TempSrc: Oral  SpO2: 98%  Weight: 191 lb 9.6 oz (86.9 kg)    Body mass index is 29.13 kg/m.   Constitutional: NAD, calm, comfortable Eyes: PERRL, lids and conjunctivae normal ENMT: Mucous membranes are moist.  Respiratory: clear to auscultation bilaterally, no wheezing, no crackles. Normal respiratory effort. No accessory muscle use.  Cardiovascular: Regular rate and rhythm, no murmurs / rubs / gallops. No  extremity edema.   Neurologic: Grossly intact and nonfocal. Psychiatric: Normal judgment and insight. Alert and oriented x 3. Normal mood.    Impression and Plan:  Diabetes mellitus with complication ([place]) -N4M is improved although still not quite at goal at 7.6. -He is on maximum doses of glipizide and Metformin.  He is also on Trulicity 1.5 weekly. -We have discussed increasing Trulicity dose, however he prefers to work on dietary changes over the next 3 months and then make that decision.  Vitamin D deficiency -Check vitamin D with next physical.  Hypertension, essential -Well-controlled on current regimen.  CAD (coronary artery disease), native coronary artery - (STEMI) LAD 99% Thrombosis - PCI with Xience eXpedition 3.0 mm x `18 mm (3.4 mm post) -Noted, stable on statin, Plavix, beta-blocker, ACE inhibitor.  Hyperlipidemia associated with type 2 diabetes mellitus (HCC) -Last A1c was 78 in 2020, he is on atorvastatin daily. -Check lipids when he returns for CPE.    Lelon Frohlich, MD [facility]

## 2020-08-12 ENCOUNTER — Other Ambulatory Visit: Payer: Self-pay | Admitting: Internal Medicine

## 2020-08-12 DIAGNOSIS — Z0000 Encounter for general adult medical examination without abnormal findings: Secondary | ICD-10-CM

## 2020-08-12 DIAGNOSIS — E118 Type 2 diabetes mellitus with unspecified complications: Secondary | ICD-10-CM

## 2020-10-05 ENCOUNTER — Other Ambulatory Visit: Payer: Self-pay | Admitting: Internal Medicine

## 2020-10-13 ENCOUNTER — Ambulatory Visit (INDEPENDENT_AMBULATORY_CARE_PROVIDER_SITE_OTHER): Payer: BC Managed Care – PPO | Admitting: Internal Medicine

## 2020-10-13 ENCOUNTER — Other Ambulatory Visit: Payer: Self-pay

## 2020-10-13 ENCOUNTER — Other Ambulatory Visit: Payer: Self-pay | Admitting: Internal Medicine

## 2020-10-13 ENCOUNTER — Encounter: Payer: Self-pay | Admitting: Internal Medicine

## 2020-10-13 VITALS — BP 120/78 | HR 82 | Temp 98.2°F | Ht 67.5 in | Wt 186.1 lb

## 2020-10-13 DIAGNOSIS — E1169 Type 2 diabetes mellitus with other specified complication: Secondary | ICD-10-CM | POA: Diagnosis not present

## 2020-10-13 DIAGNOSIS — I10 Essential (primary) hypertension: Secondary | ICD-10-CM | POA: Diagnosis not present

## 2020-10-13 DIAGNOSIS — Z23 Encounter for immunization: Secondary | ICD-10-CM

## 2020-10-13 DIAGNOSIS — I2510 Atherosclerotic heart disease of native coronary artery without angina pectoris: Secondary | ICD-10-CM

## 2020-10-13 DIAGNOSIS — E1151 Type 2 diabetes mellitus with diabetic peripheral angiopathy without gangrene: Secondary | ICD-10-CM | POA: Diagnosis not present

## 2020-10-13 DIAGNOSIS — E785 Hyperlipidemia, unspecified: Secondary | ICD-10-CM

## 2020-10-13 DIAGNOSIS — Z9861 Coronary angioplasty status: Secondary | ICD-10-CM

## 2020-10-13 DIAGNOSIS — E559 Vitamin D deficiency, unspecified: Secondary | ICD-10-CM | POA: Diagnosis not present

## 2020-10-13 DIAGNOSIS — Z0000 Encounter for general adult medical examination without abnormal findings: Secondary | ICD-10-CM

## 2020-10-13 DIAGNOSIS — E118 Type 2 diabetes mellitus with unspecified complications: Secondary | ICD-10-CM

## 2020-10-13 LAB — CBC WITH DIFFERENTIAL/PLATELET
Basophils Absolute: 0 10*3/uL (ref 0.0–0.1)
Basophils Relative: 0.7 % (ref 0.0–3.0)
Eosinophils Absolute: 0.2 10*3/uL (ref 0.0–0.7)
Eosinophils Relative: 3.1 % (ref 0.0–5.0)
HCT: 40.9 % (ref 39.0–52.0)
Hemoglobin: 13.7 g/dL (ref 13.0–17.0)
Lymphocytes Relative: 46.7 % — ABNORMAL HIGH (ref 12.0–46.0)
Lymphs Abs: 2.6 10*3/uL (ref 0.7–4.0)
MCHC: 33.6 g/dL (ref 30.0–36.0)
MCV: 88.7 fl (ref 78.0–100.0)
Monocytes Absolute: 0.4 10*3/uL (ref 0.1–1.0)
Monocytes Relative: 8.2 % (ref 3.0–12.0)
Neutro Abs: 2.3 10*3/uL (ref 1.4–7.7)
Neutrophils Relative %: 41.3 % — ABNORMAL LOW (ref 43.0–77.0)
Platelets: 235 10*3/uL (ref 150.0–400.0)
RBC: 4.61 Mil/uL (ref 4.22–5.81)
RDW: 13.9 % (ref 11.5–15.5)
WBC: 5.5 10*3/uL (ref 4.0–10.5)

## 2020-10-13 LAB — LIPID PANEL
Cholesterol: 126 mg/dL (ref 0–200)
HDL: 26.2 mg/dL — ABNORMAL LOW (ref >39.00)
LDL Cholesterol: 86 mg/dL (ref 0–99)
NonHDL: 99.92
Total CHOL/HDL Ratio: 5
Triglycerides: 69 mg/dL (ref 0.0–149.0)
VLDL: 13.8 mg/dL (ref 0.0–40.0)

## 2020-10-13 LAB — COMPREHENSIVE METABOLIC PANEL
ALT: 23 U/L (ref 0–53)
AST: 19 U/L (ref 0–37)
Albumin: 4.8 g/dL (ref 3.5–5.2)
Alkaline Phosphatase: 43 U/L (ref 39–117)
BUN: 14 mg/dL (ref 6–23)
CO2: 28 mEq/L (ref 19–32)
Calcium: 9.9 mg/dL (ref 8.4–10.5)
Chloride: 103 mEq/L (ref 96–112)
Creatinine, Ser: 0.89 mg/dL (ref 0.40–1.50)
GFR: 91.51 mL/min (ref >60.00)
Glucose, Bld: 130 mg/dL — ABNORMAL HIGH (ref 70–99)
Potassium: 4.4 mEq/L (ref 3.5–5.1)
Sodium: 140 mEq/L (ref 135–145)
Total Bilirubin: 0.5 mg/dL (ref 0.2–1.2)
Total Protein: 7.3 g/dL (ref 6.0–8.3)

## 2020-10-13 LAB — HEMOGLOBIN A1C: Hgb A1c MFr Bld: 7.6 % — ABNORMAL HIGH (ref 4.6–6.5)

## 2020-10-13 LAB — VITAMIN B12: Vitamin B-12: 247 pg/mL (ref 211–911)

## 2020-10-13 LAB — TSH: TSH: 1.52 u[IU]/mL (ref 0.35–4.50)

## 2020-10-13 LAB — VITAMIN D 25 HYDROXY (VIT D DEFICIENCY, FRACTURES): VITD: 17.92 ng/mL — ABNORMAL LOW (ref 30.00–100.00)

## 2020-10-13 MED ORDER — VITAMIN D (ERGOCALCIFEROL) 1.25 MG (50000 UNIT) PO CAPS
50000.0000 [IU] | ORAL_CAPSULE | ORAL | 0 refills | Status: AC
Start: 2020-10-13 — End: 2020-12-30

## 2020-10-13 MED ORDER — EZETIMIBE 10 MG PO TABS: 10.0000 mg | ORAL_TABLET | Freq: Every day | ORAL | 1 refills | Status: DC

## 2020-10-13 MED ORDER — TRULICITY 3 MG/0.5ML ~~LOC~~ SOAJ: 3.0000 mg | SUBCUTANEOUS | 2 refills | Status: DC

## 2020-10-13 NOTE — Addendum Note (Signed)
Addended by: Westley Hummer B on: 10/13/2020 08:10 AM   Modules accepted: Orders

## 2020-10-13 NOTE — Progress Notes (Signed)
Established Patient Office Visit     This visit occurred during the SARS-CoV-2 public health emergency.  Safety protocols were in place, including screening questions prior to the visit, additional usage of staff PPE, and extensive cleaning of exam room while observing appropriate contact time as indicated for disinfecting solutions.    CC/Reason for Visit: Annual preventive exam and follow-up chronic conditions  HPI: Matthew Prince is a 65 y.o. male who is coming in today for the above mentioned reasons. Past Medical History is significant for: Coronary artery disease status post ST elevated MI in 2014 with LAD stenting, benign essential hypertension that has been well controlled, history of type 2 diabetes, history of hyperlipidemia, h/o vit D deficiency.He has no acute complaints today.  He has lost 6 pounds since his visit in February.  He has routine eye care and dental care, he has no signs of diabetic retinopathy.  He does not exercise routinely.  He had a colonoscopy in 2012 and is due for an update in August.  He is due for his second pneumonia, his second shingles and his fourth COVID vaccines.  His 10-year tetanus booster will be due in July.   Past Medical/Surgical History: Past Medical History:  Diagnosis Date  . Allergy   . BPV (benign positional vertigo) 10/2009  . CAD S/P percutaneous coronary angioplasty    PCI to LAD, the DES  . Cardiomyopathy, ischemic - EF ~40-45% with distal Anterior - Antero& InferoApical hypokinesis. 11/29/2012   F/U Echo 02/2013: EF 50-55%. Mild anterior apical and apical lateral HK, Gr 1 DD  . Diabetes mellitus   . HISTORY OF: ST elevation myocardial infarction (STEMI) of inferior wall 11/29/2012    99% LAD occlusion - PCI with DES  . Hyperlipidemia   . Hypertension, essential   . Low back pain   . Presence of drug coated stent in LAD coronary artery 11/29/2012   Xience eXpedition DES 3.0 mm x 18 mm - (3.4 mm)    Past Surgical History:   Procedure Laterality Date  . LEFT HEART CATHETERIZATION WITH CORONARY ANGIOGRAM N/A 11/29/2012   Procedure: LEFT HEART CATHETERIZATION WITH CORONARY ANGIOGRAM;  Surgeon: Leonie Man, MD;  Location: [facility] CATH LAB;  Service: Cardiovascular;  Laterality: N/A;  . NECK SURGERY     resection benign tumor  . PERCUTANEOUS CORONARY STENT INTERVENTION (PCI-S) N/A 11/29/2012   Procedure: PERCUTANEOUS CORONARY STENT INTERVENTION (PCI-S);  Surgeon: Leonie Man, MD;  Location: [facility] CATH LAB;;; 99% thrombotic LAD occlusion - Xience Expedition 3.0 mm x 18 mm - post-dilated to 3.4 mm  . TRANSTHORACIC ECHOCARDIOGRAM  03/12/2013   Post MI: Low normal EF (50-55%), apical false tendon.  Mild HK of apical anterior and apical lateral wall.  Grade 1 diastolic dysfunction.    Social History:  reports that he has never smoked. He has never used smokeless tobacco. He reports that he does not drink alcohol. No history on file for drug use.  Allergies: Allergies  Allergen Reactions  . Hydromet [Hydrocodone Bit-Homatrop Mbr]     Lip swelling with concomitant use with tramadol  . Iodine     REACTION: unspecified  . Ultram [Tramadol]     Lip swelling with concomitant use of Hytromet    Family History:  Family History  Problem Relation Age of Onset  . Healthy Other        Noncontributory; he is not very aware of his family's history.     Current Outpatient Medications:  .  atorvastatin (LIPITOR) 80 MG tablet, TAKE 1 TABLET BY MOUTH EVERY DAY, Disp: 90 tablet, Rfl: 0 .  carvedilol (COREG) 12.5 MG tablet, TAKE 1 TABLET BY MOUTH TWICE A DAY WITH MEALS, Disp: 180 tablet, Rfl: 1 .  clopidogrel (PLAVIX) 75 MG tablet, TAKE 1 TABLET BY MOUTH EVERY DAY, Disp: 90 tablet, Rfl: 0 .  glipiZIDE (GLUCOTROL XL) 10 MG 24 hr tablet, TAKE 1 TABLET BY MOUTH DAILY WITH BREAKFAST., Disp: 90 tablet, Rfl: 1 .  glucose blood test strip, Check glucose level before each meal ONE TOUCH VERIO TEST STRIP, Disp: 100 each, Rfl: 12 .   lisinopril (ZESTRIL) 10 MG tablet, TAKE 1 TABLET BY MOUTH EVERY DAY, Disp: 90 tablet, Rfl: 1 .  metFORMIN (GLUCOPHAGE) 1000 MG tablet, TAKE 1 TABLET BY MOUTH TWICE A DAY WITH MEALS, Disp: 180 tablet, Rfl: 1 .  nitroGLYCERIN (NITROSTAT) 0.4 MG SL tablet, Place 1 tablet (0.4 mg total) under the tongue every 5 (five) minutes as needed for chest pain., Disp: 25 tablet, Rfl: 6 .  TRULICITY 1.5 HW/2.9HB SOPN, INJECT 1.5MG  INTO SKIN ONCE A WEEK, Disp: 12 pen, Rfl: 1  Review of Systems:  Constitutional: Denies fever, chills, diaphoresis, appetite change and fatigue.  HEENT: Denies photophobia, eye pain, redness, hearing loss, ear pain, congestion, sore throat, rhinorrhea, sneezing, mouth sores, trouble swallowing, neck pain, neck stiffness and tinnitus.   Respiratory: Denies SOB, DOE, cough, chest tightness,  and wheezing.   Cardiovascular: Denies chest pain, palpitations and leg swelling.  Gastrointestinal: Denies nausea, vomiting, abdominal pain, diarrhea, constipation, blood in stool and abdominal distention.  Genitourinary: Denies dysuria, urgency, frequency, hematuria, flank pain and difficulty urinating.  Endocrine: Denies: hot or cold intolerance, sweats, changes in hair or nails, polyuria, polydipsia. Musculoskeletal: Denies myalgias, back pain, joint swelling, arthralgias and gait problem.  Skin: Denies pallor, rash and wound.  Neurological: Denies dizziness, seizures, syncope, weakness, light-headedness, numbness and headaches.  Hematological: Denies adenopathy. Easy bruising, personal or family bleeding history  Psychiatric/Behavioral: Denies suicidal ideation, mood changes, confusion, nervousness, sleep disturbance and agitation    Physical Exam: Vitals:   10/13/20 0657  BP: 120/78  Pulse: 82  Temp: 98.2 F (36.8 C)  TempSrc: Oral  SpO2: 99%  Weight: 186 lb 1.6 oz (84.4 kg)  Height: 5' 7.5" (1.715 m)    Body mass index is 28.72 kg/m.   Constitutional: NAD, calm,  comfortable Eyes: PERRL, lids and conjunctivae normal ENMT: Mucous membranes are moist. Posterior pharynx clear of any exudate or lesions. Normal dentition. Tympanic membrane is pearly white, no erythema or bulging. Neck: normal, supple, no masses, no thyromegaly Respiratory: clear to auscultation bilaterally, no wheezing, no crackles. Normal respiratory effort. No accessory muscle use.  Cardiovascular: Regular rate and rhythm, no murmurs / rubs / gallops. No extremity edema. 2+ pedal pulses. No carotid bruits.  Abdomen: no tenderness, no masses palpated. No hepatosplenomegaly. Bowel sounds positive.  Musculoskeletal: no clubbing / cyanosis. No joint deformity upper and lower extremities. Good ROM, no contractures. Normal muscle tone.  Skin: no rashes, lesions, ulcers. No induration Neurologic: CN 2-12 grossly intact. Sensation intact, DTR normal. Strength 5/5 in all 4.  Psychiatric: Normal judgment and insight. Alert and oriented x 3. Normal mood.    Impression and Plan:  Encounter for preventive health examination -He has routine eye and dental care. -PCV 13 and second shingles in office today, he will get his fourth COVID-vaccine at the pharmacy, his 10-year tetanus booster will be due in July, can update next visit. -Screening  labs today. -Healthy lifestyle discussed in detail. -He will be due for repeat colonoscopy in August of this year.  Vitamin D deficiency  - Plan: VITAMIN D 25 Hydroxy (Vit-D Deficiency, Fractures)  DM (diabetes mellitus), type 2 with peripheral vascular complications (HCC)  - Plan: CBC with Differential/Platelet, Comprehensive metabolic panel, Hemoglobin A1c -Last A1c in February was 7.6, he understands that goal is less than 7.  Recheck A1c today.  Hypertension, essential -Well-controlled.  CAD (coronary artery disease), native coronary artery - (STEMI) LAD 99% Thrombosis - PCI with Xience eXpedition 3.0 mm x `18 mm (3.4 mm post) -Followed annually by  cardiology.  Hyperlipidemia associated with type 2 diabetes mellitus ([place])  - Plan: Lipid panel -Goal LDL would be less than 70. -Last lipid panel in November 2020 with a total cholesterol of 122, LDL of 70 (glycerides of 77.  Need for vaccination against Streptococcus pneumoniae -PCV 13 today as he has already had a PPSV 23 in 2020.  Need for shingles vaccine -Second shingles vaccine today.   Patient Instructions   -Nice seeing you today!!  -Lab work today; will notify you once results are available.  -Pneumonia and shingles vaccines today.  -Remember your 4th COVID vaccine.  -Schedule follow up in 3 months.   Preventive Care 47-60 Years Old, Male Preventive care refers to lifestyle choices and visits with your health care provider that can promote health and wellness. This includes:  A yearly physical exam. This is also called an annual wellness visit.  Regular dental and eye exams.  Immunizations.  Screening for certain conditions.  Healthy lifestyle choices, such as: ? Eating a healthy diet. ? Getting regular exercise. ? Not using drugs or products that contain nicotine and tobacco. ? Limiting alcohol use. What can I expect for my preventive care visit? Physical exam Your health care provider will check your:  Height and weight. These may be used to calculate your BMI (body mass index). BMI is a measurement that tells if you are at a healthy weight.  Heart rate and blood pressure.  Body temperature.  Skin for abnormal spots. Counseling Your health care provider may ask you questions about your:  Past medical problems.  Family's medical history.  Alcohol, tobacco, and drug use.  Emotional well-being.  Home life and relationship well-being.  Sexual activity.  Diet, exercise, and sleep habits.  Work and work Statistician.  Access to firearms. What immunizations do I need? Vaccines are usually given at various ages, according to a schedule.  Your health care provider will recommend vaccines for you based on your age, medical history, and lifestyle or other factors, such as travel or where you work.   What tests do I need? Blood tests  Lipid and cholesterol levels. These may be checked every 5 years, or more often if you are over 24 years old.  Hepatitis C test.  Hepatitis B test. Screening  Lung cancer screening. You may have this screening every year starting at age 41 if you have a 30-pack-year history of smoking and currently smoke or have quit within the past 15 years.  Prostate cancer screening. Recommendations will vary depending on your family history and other risks.  Genital exam to check for testicular cancer or hernias.  Colorectal cancer screening. ? All adults should have this screening starting at age 39 and continuing until age 59. ? Your health care provider may recommend screening at age 12 if you are at increased risk. ? You will have tests every 1-10  years, depending on your results and the type of screening test.  Diabetes screening. ? This is done by checking your blood sugar (glucose) after you have not eaten for a while (fasting). ? You may have this done every 1-3 years.  STD (sexually transmitted disease) testing, if you are at risk. Follow these instructions at home: Eating and drinking  Eat a diet that includes fresh fruits and vegetables, whole grains, lean protein, and low-fat dairy products.  Take vitamin and mineral supplements as recommended by your health care provider.  Do not drink alcohol if your health care provider tells you not to drink.  If you drink alcohol: ? Limit how much you have to 0-2 drinks a day. ? Be aware of how much alcohol is in your drink. In the [place] one drink equals one 12 oz bottle of beer (355 mL), one 5 oz glass of wine (148 mL), or one 1 oz glass of hard liquor (44 mL).   Lifestyle  Take daily care of your teeth and gums. Brush your teeth every morning  and night with fluoride toothpaste. Floss one time each day.  Stay active. Exercise for at least 30 minutes 5 or more days each week.  Do not use any products that contain nicotine or tobacco, such as cigarettes, e-cigarettes, and chewing tobacco. If you need help quitting, ask your health care provider.  Do not use drugs.  If you are sexually active, practice safe sex. Use a condom or other form of protection to prevent STIs (sexually transmitted infections).  If told by your health care provider, take low-dose aspirin daily starting at age 65.  Find healthy ways to cope with stress, such as: ? Meditation, yoga, or listening to music. ? Journaling. ? Talking to a trusted person. ? Spending time with friends and family. Safety  Always wear your seat belt while driving or riding in a vehicle.  Do not drive: ? If you have been drinking alcohol. Do not ride with someone who has been drinking. ? When you are tired or distracted. ? While texting.  Wear a helmet and other protective equipment during sports activities.  If you have firearms in your house, make sure you follow all gun safety procedures. What's next?  Go to your health care provider once a year for an annual wellness visit.  Ask your health care provider how often you should have your eyes and teeth checked.  Stay up to date on all vaccines. This information is not intended to replace advice given to you by your health care provider. Make sure you discuss any questions you have with your health care provider. Document Revised: 02/11/2019 Document Reviewed: 05/09/2018 Elsevier Patient Education  2021 [place], [place] [facility]

## 2020-10-13 NOTE — Addendum Note (Signed)
Addended by: Elmer Picker on: 10/13/2020 07:45 AM   Modules accepted: Orders

## 2020-10-13 NOTE — Patient Instructions (Signed)
-Nice seeing you today!!  -Lab work today; will notify you once results are available.  -Pneumonia and shingles vaccines today.  -Remember your 4th COVID vaccine.  -Schedule follow up in 3 months.   Preventive Care 63-63 Years Old, Male Preventive care refers to lifestyle choices and visits with your health care provider that can promote health and wellness. This includes:  A yearly physical exam. This is also called an annual wellness visit.  Regular dental and eye exams.  Immunizations.  Screening for certain conditions.  Healthy lifestyle choices, such as: ? Eating a healthy diet. ? Getting regular exercise. ? Not using drugs or products that contain nicotine and tobacco. ? Limiting alcohol use. What can I expect for my preventive care visit? Physical exam Your health care provider will check your:  Height and weight. These may be used to calculate your BMI (body mass index). BMI is a measurement that tells if you are at a healthy weight.  Heart rate and blood pressure.  Body temperature.  Skin for abnormal spots. Counseling Your health care provider may ask you questions about your:  Past medical problems.  Family's medical history.  Alcohol, tobacco, and drug use.  Emotional well-being.  Home life and relationship well-being.  Sexual activity.  Diet, exercise, and sleep habits.  Work and work Statistician.  Access to firearms. What immunizations do I need? Vaccines are usually given at various ages, according to a schedule. Your health care provider will recommend vaccines for you based on your age, medical history, and lifestyle or other factors, such as travel or where you work.   What tests do I need? Blood tests  Lipid and cholesterol levels. These may be checked every 5 years, or more often if you are over 63 years old.  Hepatitis C test.  Hepatitis B test. Screening  Lung cancer screening. You may have this screening every year starting  at age 63 if you have a 30-pack-year history of smoking and currently smoke or have quit within the past 15 years.  Prostate cancer screening. Recommendations will vary depending on your family history and other risks.  Genital exam to check for testicular cancer or hernias.  Colorectal cancer screening. ? All adults should have this screening starting at age 63 and continuing until age 74. ? Your health care provider may recommend screening at age 63 if you are at increased risk. ? You will have tests every 1-10 years, depending on your results and the type of screening test.  Diabetes screening. ? This is done by checking your blood sugar (glucose) after you have not eaten for a while (fasting). ? You may have this done every 1-3 years.  STD (sexually transmitted disease) testing, if you are at risk. Follow these instructions at home: Eating and drinking  Eat a diet that includes fresh fruits and vegetables, whole grains, lean protein, and low-fat dairy products.  Take vitamin and mineral supplements as recommended by your health care provider.  Do not drink alcohol if your health care provider tells you not to drink.  If you drink alcohol: ? Limit how much you have to 0-2 drinks a day. ? Be aware of how much alcohol is in your drink. In the [place] one drink equals one 12 oz bottle of beer (355 mL), one 5 oz glass of wine (148 mL), or one 1 oz glass of hard liquor (44 mL).   Lifestyle  Take daily care of your teeth and gums. Brush your teeth every  morning and night with fluoride toothpaste. Floss one time each day.  Stay active. Exercise for at least 30 minutes 5 or more days each week.  Do not use any products that contain nicotine or tobacco, such as cigarettes, e-cigarettes, and chewing tobacco. If you need help quitting, ask your health care provider.  Do not use drugs.  If you are sexually active, practice safe sex. Use a condom or other form of protection to prevent STIs  (sexually transmitted infections).  If told by your health care provider, take low-dose aspirin daily starting at age 77.  Find healthy ways to cope with stress, such as: ? Meditation, yoga, or listening to music. ? Journaling. ? Talking to a trusted person. ? Spending time with friends and family. Safety  Always wear your seat belt while driving or riding in a vehicle.  Do not drive: ? If you have been drinking alcohol. Do not ride with someone who has been drinking. ? When you are tired or distracted. ? While texting.  Wear a helmet and other protective equipment during sports activities.  If you have firearms in your house, make sure you follow all gun safety procedures. What's next?  Go to your health care provider once a year for an annual wellness visit.  Ask your health care provider how often you should have your eyes and teeth checked.  Stay up to date on all vaccines. This information is not intended to replace advice given to you by your health care provider. Make sure you discuss any questions you have with your health care provider. Document Revised: 02/11/2019 Document Reviewed: 05/09/2018 Elsevier Patient Education  2021 [employer].

## 2020-10-15 ENCOUNTER — Other Ambulatory Visit: Payer: Self-pay | Admitting: Internal Medicine

## 2020-10-15 DIAGNOSIS — E559 Vitamin D deficiency, unspecified: Secondary | ICD-10-CM

## 2020-10-15 DIAGNOSIS — E785 Hyperlipidemia, unspecified: Secondary | ICD-10-CM

## 2020-10-15 DIAGNOSIS — E1169 Type 2 diabetes mellitus with other specified complication: Secondary | ICD-10-CM

## 2020-12-31 ENCOUNTER — Other Ambulatory Visit: Payer: Self-pay | Admitting: Internal Medicine

## 2020-12-31 DIAGNOSIS — E559 Vitamin D deficiency, unspecified: Secondary | ICD-10-CM

## 2021-01-13 ENCOUNTER — Other Ambulatory Visit: Payer: Self-pay

## 2021-01-13 ENCOUNTER — Ambulatory Visit (INDEPENDENT_AMBULATORY_CARE_PROVIDER_SITE_OTHER): Payer: BC Managed Care – PPO | Admitting: Internal Medicine

## 2021-01-13 ENCOUNTER — Encounter: Payer: Self-pay | Admitting: Internal Medicine

## 2021-01-13 VITALS — BP 130/70 | HR 70 | Temp 98.1°F | Wt 184.5 lb

## 2021-01-13 DIAGNOSIS — Z9861 Coronary angioplasty status: Secondary | ICD-10-CM

## 2021-01-13 DIAGNOSIS — I10 Essential (primary) hypertension: Secondary | ICD-10-CM

## 2021-01-13 DIAGNOSIS — E118 Type 2 diabetes mellitus with unspecified complications: Secondary | ICD-10-CM

## 2021-01-13 DIAGNOSIS — I2510 Atherosclerotic heart disease of native coronary artery without angina pectoris: Secondary | ICD-10-CM | POA: Diagnosis not present

## 2021-01-13 DIAGNOSIS — E1169 Type 2 diabetes mellitus with other specified complication: Secondary | ICD-10-CM

## 2021-01-13 DIAGNOSIS — E785 Hyperlipidemia, unspecified: Secondary | ICD-10-CM

## 2021-01-13 LAB — POCT GLYCOSYLATED HEMOGLOBIN (HGB A1C): Hemoglobin A1C: 6.8 % — AB (ref 4.0–5.6)

## 2021-01-13 NOTE — Progress Notes (Signed)
Established Patient Office Visit     This visit occurred during the SARS-CoV-2 public health emergency.  Safety protocols were in place, including screening questions prior to the visit, additional usage of staff PPE, and extensive cleaning of exam room while observing appropriate contact time as indicated for disinfecting solutions.    CC/Reason for Visit: Follow-up chronic medical conditions  HPI: Matthew Prince is a 65 y.o. male who is coming in today for the above mentioned reasons. Past Medical History is significant for: Coronary artery disease status post ST elevated MI in 2014 with LAD stenting, benign essential hypertension that has been well controlled, history of type 2 diabetes, history of hyperlipidemia, h/o vit D deficiency.  This weekend he had a low blood sugar of 50, he was shaky, diaphoretic.  This improved after taking orange juice.  This was an isolated episode.  Other than this there have been no changes, he has no concerns.   Past Medical/Surgical History: Past Medical History:  Diagnosis Date   Allergy    BPV (benign positional vertigo) 10/2009   CAD S/P percutaneous coronary angioplasty    PCI to LAD, the DES   Cardiomyopathy, ischemic - EF ~40-45% with distal Anterior - Antero& InferoApical hypokinesis. 11/29/2012   F/U Echo 02/2013: EF 50-55%. Mild anterior apical and apical lateral HK, Gr 1 DD   Diabetes mellitus    HISTORY OF: ST elevation myocardial infarction (STEMI) of inferior wall 11/29/2012    99% LAD occlusion - PCI with DES   Hyperlipidemia    Hypertension, essential    Low back pain    Presence of drug coated stent in LAD coronary artery 11/29/2012   Xience eXpedition DES 3.0 mm x 18 mm - (3.4 mm)    Past Surgical History:  Procedure Laterality Date   LEFT HEART CATHETERIZATION WITH CORONARY ANGIOGRAM N/A 11/29/2012   Procedure: LEFT HEART CATHETERIZATION WITH CORONARY ANGIOGRAM;  Surgeon: Leonie Man, MD;  Location: [facility] CATH LAB;  Service:  Cardiovascular;  Laterality: N/A;   NECK SURGERY     resection benign tumor   PERCUTANEOUS CORONARY STENT INTERVENTION (PCI-S) N/A 11/29/2012   Procedure: PERCUTANEOUS CORONARY STENT INTERVENTION (PCI-S);  Surgeon: Leonie Man, MD;  Location: [facility] CATH LAB;;; 99% thrombotic LAD occlusion - Xience Expedition 3.0 mm x 18 mm - post-dilated to 3.4 mm   TRANSTHORACIC ECHOCARDIOGRAM  03/12/2013   Post MI: Low normal EF (50-55%), apical false tendon.  Mild HK of apical anterior and apical lateral wall.  Grade 1 diastolic dysfunction.    Social History:  reports that he has never smoked. He has never used smokeless tobacco. He reports that he does not drink alcohol. No history on file for drug use.  Allergies: Allergies  Allergen Reactions   Hydromet [Hydrocodone Bit-Homatrop Mbr]     Lip swelling with concomitant use with tramadol   Iodine     REACTION: unspecified   Ultram [Tramadol]     Lip swelling with concomitant use of Hytromet    Family History:  Family History  Problem Relation Age of Onset   Healthy Other        Noncontributory; he is not very aware of his family's history.     Current Outpatient Medications:    atorvastatin (LIPITOR) 80 MG tablet, TAKE 1 TABLET BY MOUTH EVERY DAY, Disp: 90 tablet, Rfl: 0   carvedilol (COREG) 12.5 MG tablet, TAKE 1 TABLET BY MOUTH TWICE A DAY WITH MEALS, Disp: 180 tablet, Rfl: 1  clopidogrel (PLAVIX) 75 MG tablet, TAKE 1 TABLET BY MOUTH EVERY DAY, Disp: 90 tablet, Rfl: 0   ezetimibe (ZETIA) 10 MG tablet, Take 1 tablet (10 mg total) by mouth daily., Disp: 90 tablet, Rfl: 1   glipiZIDE (GLUCOTROL XL) 10 MG 24 hr tablet, TAKE 1 TABLET BY MOUTH DAILY WITH BREAKFAST., Disp: 90 tablet, Rfl: 1   glucose blood test strip, Check glucose level before each meal ONE TOUCH VERIO TEST STRIP, Disp: 100 each, Rfl: 12   lisinopril (ZESTRIL) 10 MG tablet, TAKE 1 TABLET BY MOUTH EVERY DAY, Disp: 90 tablet, Rfl: 1   metFORMIN (GLUCOPHAGE) 1000 MG tablet, TAKE 1  TABLET BY MOUTH TWICE A DAY WITH MEALS, Disp: 180 tablet, Rfl: 1   nitroGLYCERIN (NITROSTAT) 0.4 MG SL tablet, Place 1 tablet (0.4 mg total) under the tongue every 5 (five) minutes as needed for chest pain., Disp: 25 tablet, Rfl: 6  Review of Systems:  Constitutional: Denies fever, chills, diaphoresis, appetite change and fatigue.  HEENT: Denies photophobia, eye pain, redness, hearing loss, ear pain, congestion, sore throat, rhinorrhea, sneezing, mouth sores, trouble swallowing, neck pain, neck stiffness and tinnitus.   Respiratory: Denies SOB, DOE, cough, chest tightness,  and wheezing.   Cardiovascular: Denies chest pain, palpitations and leg swelling.  Gastrointestinal: Denies nausea, vomiting, abdominal pain, diarrhea, constipation, blood in stool and abdominal distention.  Genitourinary: Denies dysuria, urgency, frequency, hematuria, flank pain and difficulty urinating.  Endocrine: Denies: hot or cold intolerance, sweats, changes in hair or nails, polyuria, polydipsia. Musculoskeletal: Denies myalgias, back pain, joint swelling, arthralgias and gait problem.  Skin: Denies pallor, rash and wound.  Neurological: Denies dizziness, seizures, syncope, weakness, light-headedness, numbness and headaches.  Hematological: Denies adenopathy. Easy bruising, personal or family bleeding history  Psychiatric/Behavioral: Denies suicidal ideation, mood changes, confusion, nervousness, sleep disturbance and agitation    Physical Exam: Vitals:   01/13/21 1540  BP: 130/70  Pulse: 70  Temp: 98.1 F (36.7 C)  TempSrc: Oral  SpO2: 97%  Weight: 184 lb 8 oz (83.7 kg)    Body mass index is 28.47 kg/m.   Constitutional: NAD, calm, comfortable Eyes: PERRL, lids and conjunctivae normal ENMT: Mucous membranes are moist.  Respiratory: clear to auscultation bilaterally, no wheezing, no crackles. Normal respiratory effort. No accessory muscle use.  Cardiovascular: Regular rate and rhythm, no murmurs /  rubs / gallops. No extremity edema.  Neurologic: Grossly intact and nonfocal Psychiatric: Normal judgment and insight. Alert and oriented x 3. Normal mood.    Impression and Plan:  Diabetes mellitus with complication ([place])  -With an isolated hypoglycemic event. -A1c has improved from 7.6-6.8. -Continue metformin and glipizide at current doses. -We have discussed action plan for hypoglycemia.  He knows to contact either [place] or 911 services for prolonged hypoglycemia. -As this was an isolated event, do not believe we need to change his regimen.  Hyperlipidemia associated with type 2 diabetes mellitus ([place]) -At last visit we added ezetimibe to maximum dose atorvastatin due to suboptimal LDL control. -Recheck next visit.  Hypertension, essential -Blood pressure is well controlled, continue current regimen.  CAD (coronary artery disease), native coronary artery - (STEMI) LAD 99% Thrombosis - PCI with Xience eXpedition 3.0 mm x `18 mm (3.4 mm post) -Stable, no chest pain, no shortness of breath, follows yearly with cardiology.  Time spent: 31 minutes reviewing chart, interviewing and examining patient and formulating plan of care.     Lelon Frohlich, MD [facility]

## 2021-01-19 ENCOUNTER — Other Ambulatory Visit: Payer: Self-pay | Admitting: Internal Medicine

## 2021-01-19 DIAGNOSIS — E559 Vitamin D deficiency, unspecified: Secondary | ICD-10-CM

## 2021-02-01 DIAGNOSIS — Z9189 Other specified personal risk factors, not elsewhere classified: Secondary | ICD-10-CM | POA: Diagnosis not present

## 2021-02-01 DIAGNOSIS — Z1152 Encounter for screening for COVID-19: Secondary | ICD-10-CM | POA: Diagnosis not present

## 2021-02-08 DIAGNOSIS — Z20822 Contact with and (suspected) exposure to covid-19: Secondary | ICD-10-CM | POA: Diagnosis not present

## 2021-02-16 ENCOUNTER — Other Ambulatory Visit: Payer: Self-pay | Admitting: Internal Medicine

## 2021-03-01 ENCOUNTER — Encounter: Payer: Self-pay | Admitting: Gastroenterology

## 2021-03-25 ENCOUNTER — Other Ambulatory Visit: Payer: Self-pay | Admitting: Internal Medicine

## 2021-03-25 DIAGNOSIS — E118 Type 2 diabetes mellitus with unspecified complications: Secondary | ICD-10-CM

## 2021-04-02 ENCOUNTER — Other Ambulatory Visit: Payer: Self-pay | Admitting: Internal Medicine

## 2021-04-02 DIAGNOSIS — E118 Type 2 diabetes mellitus with unspecified complications: Secondary | ICD-10-CM

## 2021-04-02 DIAGNOSIS — Z0000 Encounter for general adult medical examination without abnormal findings: Secondary | ICD-10-CM

## 2021-04-02 DIAGNOSIS — E785 Hyperlipidemia, unspecified: Secondary | ICD-10-CM

## 2021-04-02 DIAGNOSIS — E1169 Type 2 diabetes mellitus with other specified complication: Secondary | ICD-10-CM

## 2021-04-11 ENCOUNTER — Encounter: Payer: Self-pay | Admitting: Internal Medicine

## 2021-05-02 ENCOUNTER — Telehealth: Payer: Self-pay

## 2021-05-02 ENCOUNTER — Encounter: Payer: Self-pay | Admitting: Internal Medicine

## 2021-05-02 ENCOUNTER — Ambulatory Visit (INDEPENDENT_AMBULATORY_CARE_PROVIDER_SITE_OTHER): Payer: BC Managed Care – PPO | Admitting: Internal Medicine

## 2021-05-02 VITALS — BP 150/84 | HR 91 | Ht 67.5 in | Wt 187.0 lb

## 2021-05-02 DIAGNOSIS — Z1211 Encounter for screening for malignant neoplasm of colon: Secondary | ICD-10-CM | POA: Diagnosis not present

## 2021-05-02 NOTE — Telephone Encounter (Signed)
[facility] Pre-operative Risk Assessment     Request for surgical clearance:     Endoscopy Procedure  What type of surgery is being performed?     Colonoscopy  When is this surgery scheduled?     05-13-21  What type of clearance is required ?   Pharmacy  Are there any medications that need to be held prior to surgery and how long? Plavix x 5 days  Practice name and name of physician performing surgery?      [facility]  What is your office phone and fax number?      Phone- [phone]  [phone]  Anesthesia type (None, local, MAC, general) ?       MAC

## 2021-05-02 NOTE — Progress Notes (Signed)
Chief Complaint: Colon cancer screening  HPI : 65 year old with history of CAD s/p PCI on Plavix, DM, and HTN presents for colon cancer screening.  He is here to talk about a screening colonoscopy.  His last colonoscopy was done 10 years ago by Dr. Sharlett Iles that showed only a hyperplastic polyp.  Denies hematochezia, melena, weight loss, changes in bowel habits.  Denies other GI issues.  Denies family history of colon cancer.  He does take Plavix due to a heart stent that was placed in the past.  His last heart stent was placed in 2014.  He works as an Nurse, adult.   Past Medical History:  Diagnosis Date   Allergy    BPV (benign positional vertigo) 10/2009   CAD S/P percutaneous coronary angioplasty    PCI to LAD, the DES   Cardiomyopathy, ischemic - EF ~40-45% with distal Anterior - Antero& InferoApical hypokinesis. 11/29/2012   F/U Echo 02/2013: EF 50-55%. Mild anterior apical and apical lateral HK, Gr 1 DD   Diabetes mellitus    HISTORY OF: ST elevation myocardial infarction (STEMI) of inferior wall 11/29/2012    99% LAD occlusion - PCI with DES   Hyperlipidemia    Hypertension, essential    Low back pain    Presence of drug coated stent in LAD coronary artery 11/29/2012   Xience eXpedition DES 3.0 mm x 18 mm - (3.4 mm)     Past Surgical History:  Procedure Laterality Date   LEFT HEART CATHETERIZATION WITH CORONARY ANGIOGRAM N/A 11/29/2012   Procedure: LEFT HEART CATHETERIZATION WITH CORONARY ANGIOGRAM;  Surgeon: Leonie Man, MD;  Location: [facility] CATH LAB;  Service: Cardiovascular;  Laterality: N/A;   NECK SURGERY     resection benign tumor   PERCUTANEOUS CORONARY STENT INTERVENTION (PCI-S) N/A 11/29/2012   Procedure: PERCUTANEOUS CORONARY STENT INTERVENTION (PCI-S);  Surgeon: Leonie Man, MD;  Location: [facility] CATH LAB;;; 99% thrombotic LAD occlusion - Xience Expedition 3.0 mm x 18 mm - post-dilated to 3.4 mm   TRANSTHORACIC ECHOCARDIOGRAM  03/12/2013   Post MI: Low normal  EF (50-55%), apical false tendon.  Mild HK of apical anterior and apical lateral wall.  Grade 1 diastolic dysfunction.   Family History  Problem Relation Age of Onset   Healthy Other        Noncontributory; he is not very aware of his family's history.   Social History   Tobacco Use   Smoking status: Never   Smokeless tobacco: Never  Substance Use Topics   Alcohol use: No   Current Outpatient Medications  Medication Sig Dispense Refill   atorvastatin (LIPITOR) 80 MG tablet TAKE 1 TABLET BY MOUTH EVERY DAY 90 tablet 1   carvedilol (COREG) 12.5 MG tablet TAKE 1 TABLET BY MOUTH TWICE A DAY WITH MEALS 180 tablet 1   clopidogrel (PLAVIX) 75 MG tablet TAKE 1 TABLET BY MOUTH EVERY DAY 90 tablet 1   ezetimibe (ZETIA) 10 MG tablet TAKE 1 TABLET BY MOUTH EVERY DAY 90 tablet 1   glipiZIDE (GLUCOTROL XL) 10 MG 24 hr tablet TAKE 1 TABLET BY MOUTH EVERY DAY WITH BREAKFAST 90 tablet 1   glucose blood test strip Check glucose level before each meal ONE TOUCH VERIO TEST STRIP 100 each 12   lisinopril (ZESTRIL) 10 MG tablet TAKE 1 TABLET BY MOUTH EVERY DAY 90 tablet 1   metFORMIN (GLUCOPHAGE) 1000 MG tablet TAKE 1 TABLET BY MOUTH TWICE A DAY WITH MEALS 180 tablet 1  TRULICITY 3 EB/3.4DH SOPN INJECT 3 MG AS DIRECTED ONCE A WEEK. 6 mL 1   No current facility-administered medications for this visit.   Allergies  Allergen Reactions   Hydromet [Hydrocodone Bit-Homatrop Mbr]     Lip swelling with concomitant use with tramadol   Iodine     REACTION: unspecified   Ultram [Tramadol]     Lip swelling with concomitant use of Hytromet     Review of Systems: All systems reviewed and negative except where noted in HPI.   Physical Exam: BP (!) 150/84   Pulse 91   Ht 5' 7.5" (1.715 m)   Wt 187 lb (84.8 kg)   BMI 28.86 kg/m  Constitutional: Pleasant,well-developed, male in no acute distress. HEENT: Normocephalic and atraumatic. Conjunctivae are normal. No scleral icterus. Cardiovascular: Normal  rate, regular rhythm.  Pulmonary/chest: Effort normal and breath sounds normal. No wheezing, rales or rhonchi. Abdominal: Soft, nondistended, nontender. Bowel sounds active throughout. There are no masses palpable. No hepatomegaly. Extremities: No edema Neurological: Alert and oriented to person place and time. Skin: Skin is warm and dry. No rashes noted. Psychiatric: Normal mood and affect. Behavior is normal.  Colonoscopy 01/09/11:  Path: Hyperplastic polyp  ASSESSMENT AND PLAN:  Colon cancer screening Diverticulosis Patient presents to discuss colon cancer screening.  His last colonoscopy was in 2012 that showed only diverticulosis and one hyperplastic polyp.  It was recommended that he get 10-year follow-up.  He is due for colonoscopy for colon cancer screening. - Colonosopy [facility]. Will reach out to cardiologist Dr. Ellyn Hack about holding Plavix  Christia Reading, MD

## 2021-05-02 NOTE — Patient Instructions (Addendum)
If you are age 63 or younger, your body mass index should be between 19-25. Your Body mass index is 28.86 kg/m. If this is out of the aformentioned range listed, please consider follow up with your Primary Care Provider.  ________________________________________________________  The [facility] providers would like to encourage you to use [facility] to communicate with providers for non-urgent requests or questions.  Due to long hold times on the telephone, sending your provider a message by [facility] may be a faster and more efficient way to get a response.  Please allow 48 business hours for a response.  Please remember that this is for non-urgent requests.  _______________________________________________________  Dennis Bast have been scheduled for a colonoscopy. Please follow written instructions given to you at your visit today.  Please pick up your prep supplies at the pharmacy within the next 1-3 days. If you use inhalers (even only as needed), please bring them with you on the day of your procedure.  Due to recent changes in healthcare laws, you may see the results of your imaging and laboratory studies on MyChart before your provider has had a chance to review them.  We understand that in some cases there may be results that are confusing or concerning to you. Not all laboratory results come back in the same time frame and the provider may be waiting for multiple results in order to interpret others.  Please give [place] 48 hours in order for your provider to thoroughly review all the results before contacting the office for clarification of your results.   Thank you for entrusting me with your care and choosing [facility].  Dr Lorenso Courier

## 2021-05-03 NOTE — Telephone Encounter (Signed)
Dr. Ellyn Hack Hx of anterior STEMI treated with DES to LAD in 2014. He is on plavix monotherapy. We are asked to hold plavix for colonoscopy.   OK to hold plavix?

## 2021-05-04 NOTE — Telephone Encounter (Signed)
Yes - fine to hold for C-Scope. 5 d preop.  Glenetta Hew, MD

## 2021-05-05 ENCOUNTER — Telehealth: Payer: Self-pay | Admitting: Cardiology

## 2021-05-05 ENCOUNTER — Telehealth: Payer: Self-pay

## 2021-05-05 NOTE — Telephone Encounter (Addendum)
   Name: Matthew Prince  DOB: 04-28-1956  MRN: [id number]  Primary Cardiologist: Glenetta Hew, MD  Chart reviewed as part of pre-operative protocol coverage. Antiplatelet therapy addressed below but when reviewing chart, patient has not been seen since 07/2019 (well over 1 year ago). Reached out to Dr. Ellyn Hack via secure chat to clarify whether pt may proceed without updated appt and just get back on track for f/u after study, or needs OV first. Per GI notes, colonoscopy is for screening, not acute issue.  Per Dr. Ellyn Hack, [name] to clear. We just need to ensure no new issues from last OV. Called patient but got VM. LMTCB. Will then need to get back on track for usual f/u.   Addendum: Patient called operator back and phone note routed back to pre-op pool. Tried to call him back but we could not reach him when returning call again. Operator left another msg to call back. His regular # goes right to VM, his home phone just rings.  Charlie Pitter, PA-C  05/05/2021, 9:42 AM

## 2021-05-05 NOTE — Telephone Encounter (Signed)
Returned pt's call. No answer at this time. Left message for him to return the call.  Pt needs to speak with Charlie Pitter, PA-C

## 2021-05-05 NOTE — Telephone Encounter (Signed)
Follow Up:     Patient says he is returning a call from today,, but he did not know who called him

## 2021-05-05 NOTE — Telephone Encounter (Signed)
Duplicate, will address in other call note.

## 2021-05-06 NOTE — Telephone Encounter (Signed)
I tried to call patient again 3x today, no answer. Although not ideal to have to speak with on-call team, this is fairly time sensitive due to procedure date so asked him to call back tomorrow during 8-5 to discuss finalizing preop clearance as below. For a 5 day hold he'd need to begin holding Plavix as of Sunday if stable. See notes below. The other number listed in his chart just rings and then says unable to take call.

## 2021-05-09 NOTE — Telephone Encounter (Signed)
Please reschedule colonoscopy with Dr. Lorenso Courier to allow a 5 day hold of Plavix.

## 2021-05-09 NOTE — Telephone Encounter (Signed)
Left message on patient's cell phone and on [facility] [place] (on [place]) for someone to return call to reschedule procedure per Dr Fuller Plan as patient was not available to for instructions on how to hold Plavix.  Will continue to try and reach patient.

## 2021-05-09 NOTE — Telephone Encounter (Signed)
   Name: Matthew Prince  DOB: 06-19-1955  MRN: [id number]   Primary Cardiologist: Glenetta Hew, MD  Chart reviewed as part of pre-operative protocol coverage. Patient was contacted 05/09/2021 in reference to pre-operative risk assessment for pending surgery as outlined below.  Seddrick Bologna was last seen on 08/14/2019 by Dr. Ellyn Hack.  Since that day, Antwuan Eckley has done well without chest pain or worsening dyspnea.  Unfortunately, despite multiple attempts, we were only able to speak with Mr. Schellhase today on 12/12. He mentions the last dose of plavix was this morning. Therefore, there is not enough time between now and the date of the surgery for a full 5 day hold. He is cleared from the cardiac perspective. Will defer to GI service to decide if they are willing to do the GI procedure with a shorter holding time of plavix or does the procedure need to be rescheduled. I have instructed the patient to start holding plavix and restart as soon as possible at the GI doctor's discretion.  Therefore, based on ACC/AHA guidelines, the patient would be at acceptable risk for the planned procedure without further cardiovascular testing.   The patient was advised that if he develops new symptoms prior to surgery to contact our office to arrange for a follow-up visit, and he verbalized understanding.  I will route this recommendation to the requesting party via Epic fax function and remove from pre-op pool. Please call with questions.  [place], [place] 05/09/2021, 9:02 AM

## 2021-05-10 NOTE — Telephone Encounter (Signed)
Patient returned call and was rescheduled for 05-20-21.  I attempted to reach patient to go over new prep instructions including Plavix 5 day hold.  Will send patient [place] with instructions, and advised patient to return call to discuss by phone. Will continue efforts.

## 2021-05-12 NOTE — Telephone Encounter (Signed)
Patient advised that he has been given clearance to hold Plavix 5 days prior to procedure scheduled for 05-20-21.  Patient advised to take last dose of Plavix on 05-14-21, and he will be advised when to restart Plavix by Dr Lorenso Courier after the procedure.  Patient agreed to plan and verbalized understanding.  No further questions.

## 2021-05-13 ENCOUNTER — Encounter: Payer: BC Managed Care – PPO | Admitting: Internal Medicine

## 2021-05-19 ENCOUNTER — Encounter: Payer: Self-pay | Admitting: Internal Medicine

## 2021-05-19 ENCOUNTER — Ambulatory Visit (INDEPENDENT_AMBULATORY_CARE_PROVIDER_SITE_OTHER): Payer: BC Managed Care – PPO | Admitting: Internal Medicine

## 2021-05-19 VITALS — BP 140/80 | HR 83 | Temp 97.6°F | Wt 188.9 lb

## 2021-05-19 DIAGNOSIS — I2510 Atherosclerotic heart disease of native coronary artery without angina pectoris: Secondary | ICD-10-CM | POA: Diagnosis not present

## 2021-05-19 DIAGNOSIS — I10 Essential (primary) hypertension: Secondary | ICD-10-CM | POA: Diagnosis not present

## 2021-05-19 DIAGNOSIS — E1151 Type 2 diabetes mellitus with diabetic peripheral angiopathy without gangrene: Secondary | ICD-10-CM | POA: Diagnosis not present

## 2021-05-19 DIAGNOSIS — Z23 Encounter for immunization: Secondary | ICD-10-CM | POA: Diagnosis not present

## 2021-05-19 DIAGNOSIS — E785 Hyperlipidemia, unspecified: Secondary | ICD-10-CM

## 2021-05-19 DIAGNOSIS — E1169 Type 2 diabetes mellitus with other specified complication: Secondary | ICD-10-CM

## 2021-05-19 DIAGNOSIS — Z9861 Coronary angioplasty status: Secondary | ICD-10-CM

## 2021-05-19 LAB — POCT GLYCOSYLATED HEMOGLOBIN (HGB A1C): Hemoglobin A1C: 7 % — AB (ref 4.0–5.6)

## 2021-05-19 NOTE — Patient Instructions (Signed)
-  Nice seeing you today!!  -Flu vaccine today.  -Please check your BP 2-3 times a week and bring into your next visit.  -Schedule follow up in 3 months.

## 2021-05-19 NOTE — Progress Notes (Signed)
Established Patient Office Visit     This visit occurred during the SARS-CoV-2 public health emergency.  Safety protocols were in place, including screening questions prior to the visit, additional usage of staff PPE, and extensive cleaning of exam room while observing appropriate contact time as indicated for disinfecting solutions.    CC/Reason for Visit: 45-month follow-up chronic medical conditions  HPI: Matthew Prince is a 65 y.o. male who is coming in today for the above mentioned reasons. Past Medical History is significant for: Hypertension, type 2 diabetes, hyperlipidemia, vitamin D deficiency, coronary artery disease status post ST elevated MI in 2014 with LAD stent.  He has no acute concerns or hypoglycemic episodes.  He is scheduled for his colonoscopy tomorrow.  He is requesting a flu vaccine today.   Past Medical/Surgical History: Past Medical History:  Diagnosis Date   Allergy    BPV (benign positional vertigo) 10/2009   CAD S/P percutaneous coronary angioplasty    PCI to LAD, the DES   Cardiomyopathy, ischemic - EF ~40-45% with distal Anterior - Antero& InferoApical hypokinesis. 11/29/2012   F/U Echo 02/2013: EF 50-55%. Mild anterior apical and apical lateral HK, Gr 1 DD   Diabetes mellitus    HISTORY OF: ST elevation myocardial infarction (STEMI) of inferior wall 11/29/2012    99% LAD occlusion - PCI with DES   Hyperlipidemia    Hypertension, essential    Low back pain    Presence of drug coated stent in LAD coronary artery 11/29/2012   Xience eXpedition DES 3.0 mm x 18 mm - (3.4 mm)    Past Surgical History:  Procedure Laterality Date   LEFT HEART CATHETERIZATION WITH CORONARY ANGIOGRAM N/A 11/29/2012   Procedure: LEFT HEART CATHETERIZATION WITH CORONARY ANGIOGRAM;  Surgeon: Leonie Man, MD;  Location: [facility] CATH LAB;  Service: Cardiovascular;  Laterality: N/A;   NECK SURGERY     resection benign tumor   PERCUTANEOUS CORONARY STENT INTERVENTION (PCI-S) N/A 11/29/2012    Procedure: PERCUTANEOUS CORONARY STENT INTERVENTION (PCI-S);  Surgeon: Leonie Man, MD;  Location: [facility] CATH LAB;;; 99% thrombotic LAD occlusion - Xience Expedition 3.0 mm x 18 mm - post-dilated to 3.4 mm   TRANSTHORACIC ECHOCARDIOGRAM  03/12/2013   Post MI: Low normal EF (50-55%), apical false tendon.  Mild HK of apical anterior and apical lateral wall.  Grade 1 diastolic dysfunction.    Social History:  reports that he has never smoked. He has never used smokeless tobacco. He reports that he does not drink alcohol. No history on file for drug use.  Allergies: Allergies  Allergen Reactions   Hydromet [Hydrocodone Bit-Homatrop Mbr]     Lip swelling with concomitant use with tramadol   Iodine     REACTION: unspecified   Ultram [Tramadol]     Lip swelling with concomitant use of Hytromet    Family History:  Family History  Problem Relation Age of Onset   Healthy Other        Noncontributory; he is not very aware of his family's history.     Current Outpatient Medications:    atorvastatin (LIPITOR) 80 MG tablet, TAKE 1 TABLET BY MOUTH EVERY DAY, Disp: 90 tablet, Rfl: 1   carvedilol (COREG) 12.5 MG tablet, TAKE 1 TABLET BY MOUTH TWICE A DAY WITH MEALS, Disp: 180 tablet, Rfl: 1   ezetimibe (ZETIA) 10 MG tablet, TAKE 1 TABLET BY MOUTH EVERY DAY, Disp: 90 tablet, Rfl: 1   glipiZIDE (GLUCOTROL XL) 10 MG 24 hr  tablet, TAKE 1 TABLET BY MOUTH EVERY DAY WITH BREAKFAST, Disp: 90 tablet, Rfl: 1   glucose blood test strip, Check glucose level before each meal ONE TOUCH VERIO TEST STRIP, Disp: 100 each, Rfl: 12   lisinopril (ZESTRIL) 10 MG tablet, TAKE 1 TABLET BY MOUTH EVERY DAY, Disp: 90 tablet, Rfl: 1   metFORMIN (GLUCOPHAGE) 1000 MG tablet, TAKE 1 TABLET BY MOUTH TWICE A DAY WITH MEALS, Disp: 180 tablet, Rfl: 1   TRULICITY 3 BU/3.8GT SOPN, INJECT 3 MG AS DIRECTED ONCE A WEEK., Disp: 6 mL, Rfl: 1   clopidogrel (PLAVIX) 75 MG tablet, TAKE 1 TABLET BY MOUTH EVERY DAY (Patient not taking:  Reported on 05/19/2021), Disp: 90 tablet, Rfl: 1  Review of Systems:  Constitutional: Denies fever, chills, diaphoresis, appetite change and fatigue.  HEENT: Denies photophobia, eye pain, redness, hearing loss, ear pain, congestion, sore throat, rhinorrhea, sneezing, mouth sores, trouble swallowing, neck pain, neck stiffness and tinnitus.   Respiratory: Denies SOB, DOE, cough, chest tightness,  and wheezing.   Cardiovascular: Denies chest pain, palpitations and leg swelling.  Gastrointestinal: Denies nausea, vomiting, abdominal pain, diarrhea, constipation, blood in stool and abdominal distention.  Genitourinary: Denies dysuria, urgency, frequency, hematuria, flank pain and difficulty urinating.  Endocrine: Denies: hot or cold intolerance, sweats, changes in hair or nails, polyuria, polydipsia. Musculoskeletal: Denies myalgias, back pain, joint swelling, arthralgias and gait problem.  Skin: Denies pallor, rash and wound.  Neurological: Denies dizziness, seizures, syncope, weakness, light-headedness, numbness and headaches.  Hematological: Denies adenopathy. Easy bruising, personal or family bleeding history  Psychiatric/Behavioral: Denies suicidal ideation, mood changes, confusion, nervousness, sleep disturbance and agitation    Physical Exam: Vitals:   05/19/21 1530  BP: 140/80  Pulse: 83  Temp: 97.6 F (36.4 C)  TempSrc: Oral  SpO2: 98%  Weight: 188 lb 14.4 oz (85.7 kg)    Body mass index is 29.15 kg/m.   Constitutional: NAD, calm, comfortable Eyes: PERRL, lids and conjunctivae normal ENMT: Mucous membranes are moist.  Respiratory: clear to auscultation bilaterally, no wheezing, no crackles. Normal respiratory effort. No accessory muscle use.  Cardiovascular: Regular rate and rhythm, no murmurs / rubs / gallops. No extremity edema.  Neurologic: Grossly intact and nonfocal Psychiatric: Normal judgment and insight. Alert and oriented x 3. Normal mood.    Impression and  Plan:  DM (diabetes mellitus), type 2 with peripheral vascular complications ([place])  - Plan: POCT glycosylated hemoglobin (Hb A1C) -A1c demonstrates good control at 7.0.  Hyperlipidemia associated with type 2 diabetes mellitus ([place]) -Last lipid panel in May 2022 with a total cholesterol of 126, triglycerides 69 and LDL 86, goal LDL is less than 70. -At last visit ezetimibe was added to maximal dose atorvastatin, recheck lipids when he returns.  Hypertension, essential -Blood pressure is noted to be elevated today. -He will do ambulatory blood pressure monitoring and we will discuss during his follow-up visit in 3 months.  CAD (coronary artery disease), native coronary artery - (STEMI) LAD 99% Thrombosis - PCI with Xience eXpedition 3.0 mm x `18 mm (3.4 mm post) -Stable, no chest pain.  Need for influenza vaccination -Flu vaccine administered today.  Time spent: 32 minutes reviewing chart, interviewing and examining patient and formulating plan of care.   Patient Instructions  -Nice seeing you today!!  -Flu vaccine today.  -Please check your BP 2-3 times a week and bring into your next visit.  -Schedule follow up in 3 months.    Lelon Frohlich, MD [facility]  at [facility]

## 2021-05-19 NOTE — Addendum Note (Signed)
Addended by: Westley Hummer B on: 05/19/2021 04:16 PM   Modules accepted: Orders

## 2021-05-20 ENCOUNTER — Encounter: Payer: Self-pay | Admitting: Internal Medicine

## 2021-05-20 ENCOUNTER — Ambulatory Visit (AMBULATORY_SURGERY_CENTER): Payer: BC Managed Care – PPO | Admitting: Internal Medicine

## 2021-05-20 VITALS — BP 126/83 | HR 80 | Temp 97.8°F | Resp 15 | Ht 67.0 in | Wt 187.0 lb

## 2021-05-20 DIAGNOSIS — D123 Benign neoplasm of transverse colon: Secondary | ICD-10-CM

## 2021-05-20 DIAGNOSIS — D122 Benign neoplasm of ascending colon: Secondary | ICD-10-CM

## 2021-05-20 DIAGNOSIS — Z1211 Encounter for screening for malignant neoplasm of colon: Secondary | ICD-10-CM

## 2021-05-20 DIAGNOSIS — K635 Polyp of colon: Secondary | ICD-10-CM

## 2021-05-20 DIAGNOSIS — D124 Benign neoplasm of descending colon: Secondary | ICD-10-CM

## 2021-05-20 MED ORDER — DEXTROSE 5 % IV SOLN: Freq: Once | INTRAVENOUS | Status: AC

## 2021-05-20 MED ORDER — SODIUM CHLORIDE 0.9 % IV SOLN: 500.0000 mL | Freq: Once | INTRAVENOUS | Status: DC

## 2021-05-20 NOTE — Progress Notes (Signed)
VS CW  Pt's states no medical or surgical changes since previsit or office visit.  CGB 68 on admission. IV D5W hung at 0906 per protocol.  0921 follow up CBG 86

## 2021-05-20 NOTE — Op Note (Signed)
[place] Patient Name: [name] Procedure Date: 05/20/2021 9:31 AM MRN: [id number] Endoscopist: Sonny Masters "Matthew Prince ,  Age: 66 Referring MD:  Date of Birth: March 21, 1956 Gender: Male Account #: [id number] Procedure:                Colonoscopy Indications:              Screening for colorectal malignant neoplasm Medicines:                Monitored Anesthesia Care Procedure:                Pre-Anesthesia Assessment:                           - Prior to the procedure, a History and Physical                            was performed, and patient medications and                            allergies were reviewed. The patient's tolerance of                            previous anesthesia was also reviewed. The risks                            and benefits of the procedure and the sedation                            options and risks were discussed with the patient.                            All questions were answered, and informed consent                            was obtained. Prior Anticoagulants: The patient has                            taken Plavix (clopidogrel), last dose was 5 days                            prior to procedure. ASA Grade Assessment: III - A                            patient with severe systemic disease. After                            reviewing the risks and benefits, the patient was                            deemed in satisfactory condition to undergo the                            procedure.  After obtaining informed consent, the colonoscope                            was passed under direct vision. Throughout the                            procedure, the patient's blood pressure, pulse, and                            oxygen saturations were monitored continuously. The                            CF [id number] #[id number] was introduced through the anus                            and advanced to the the terminal ileum. The                             colonoscopy was performed without difficulty. The                            patient tolerated the procedure well. The quality                            of the bowel preparation was adequate. The terminal                            ileum, ileocecal valve, appendiceal orifice, and                            rectum were photographed. Scope In: 9:40:30 AM Scope Out: 10:04:00 AM Scope Withdrawal Time: 0 hours 17 minutes 42 seconds  Total Procedure Duration: 0 hours 23 minutes 30 seconds  Findings:                 The terminal ileum appeared normal.                           Four sessile polyps were found in the descending                            colon, transverse colon and ascending colon. The                            polyps were 2 to 6 mm in size. These polyps were                            removed with a cold snare. Resection and retrieval                            were complete.                           Multiple small and large-mouthed diverticula were  found in the entire colon.                           Non-bleeding internal hemorrhoids were found during                            retroflexion. Complications:            No immediate complications. Estimated Blood Loss:     Estimated blood loss was minimal. Impression:               - The examined portion of the ileum was normal.                           - Four 2 to 6 mm polyps in the descending colon, in                            the transverse colon and in the ascending colon,                            removed with a cold snare. Resected and retrieved.                           - Diverticulosis in the entire examined colon.                           - Non-bleeding internal hemorrhoids. Recommendation:           - Discharge patient to home (with escort).                           - Await pathology results.                           - The findings and recommendations were  discussed                            with the patient. Sonny Masters "Matthew Prince,  05/20/2021 10:10:16 AM

## 2021-05-20 NOTE — Patient Instructions (Signed)
Discharge instructions given. Handouts on polyps,diverticulosis and hemorrhoids. Resume previous medications. YOU HAD AN ENDOSCOPIC PROCEDURE TODAY AT [facility]:   Refer to the procedure report that was given to you for any specific questions about what was found during the examination.  If the procedure report does not answer your questions, please call your gastroenterologist to clarify.  If you requested that your care partner not be given the details of your procedure findings, then the procedure report has been included in a sealed envelope for you to review at your convenience later.  YOU SHOULD EXPECT: Some feelings of bloating in the abdomen. Passage of more gas than usual.  Walking can help get rid of the air that was put into your GI tract during the procedure and reduce the bloating. If you had a lower endoscopy (such as a colonoscopy or flexible sigmoidoscopy) you may notice spotting of blood in your stool or on the toilet paper. If you underwent a bowel prep for your procedure, you may not have a normal bowel movement for a few days.  Please Note:  You might notice some irritation and congestion in your nose or some drainage.  This is from the oxygen used during your procedure.  There is no need for concern and it should clear up in a day or so.  SYMPTOMS TO REPORT IMMEDIATELY:  Following lower endoscopy (colonoscopy or flexible sigmoidoscopy):  Excessive amounts of blood in the stool  Significant tenderness or worsening of abdominal pains  Swelling of the abdomen that is new, acute  Fever of 100F or higher   For urgent or emergent issues, a gastroenterologist can be reached at any hour by calling [phone]. Do not use MyChart messaging for urgent concerns.    DIET:  We do recommend a small meal at first, but then you may proceed to your regular diet.  Drink plenty of fluids but you should avoid alcoholic beverages for 24 hours.  ACTIVITY:  You should  plan to take it easy for the rest of today and you should NOT DRIVE or use heavy machinery until tomorrow (because of the sedation medicines used during the test).    FOLLOW UP: Our staff will call the number listed on your records 48-72 hours following your procedure to check on you and address any questions or concerns that you may have regarding the information given to you following your procedure. If we do not reach you, we will leave a message.  We will attempt to reach you two times.  During this call, we will ask if you have developed any symptoms of COVID 19. If you develop any symptoms (ie: fever, flu-like symptoms, shortness of breath, cough etc.) before then, please call [phone].  If you test positive for Covid 19 in the 2 weeks post procedure, please call and report this information to [place].    If any biopsies were taken you will be contacted by phone or by letter within the next 1-3 weeks.  Please call us at [phone] if you have not heard about the biopsies in 3 weeks.    SIGNATURES/CONFIDENTIALITY: You and/or your care partner have signed paperwork which will be entered into your electronic medical record.  These signatures attest to the fact that that the information above on your After Visit Summary has been reviewed and is understood.  Full responsibility of the confidentiality of this discharge information lies with you and/or your care-partner.

## 2021-05-20 NOTE — Progress Notes (Signed)
Report to PACU, RN, vss, BBS= Clear.  

## 2021-05-20 NOTE — Progress Notes (Signed)
GASTROENTEROLOGY PROCEDURE H&P NOTE   Primary Care Physician: Isaac Bliss, Rayford Halsted, MD    Reason for Procedure:   Colon cancer screening  Plan:    Colonoscopy  Patient is appropriate for endoscopic procedure(s) in the ambulatory ([place]) setting.  The nature of the procedure, as well as the risks, benefits, and alternatives were carefully and thoroughly reviewed with the patient. Ample time for discussion and questions allowed. The patient understood, was satisfied, and agreed to proceed.     HPI: Matthew Prince is a 65 y.o. male who presents for colonoscopy for evaluation of colon cancer screening .  Patient was most recently seen in the Gastroenterology Clinic on 05/02/21.  No interval change in medical history since that appointment. Please refer to that note for full details regarding GI history and clinical presentation.   Past Medical History:  Diagnosis Date   Allergy    BPV (benign positional vertigo) 10/2009   CAD S/P percutaneous coronary angioplasty    PCI to LAD, the DES   Cardiomyopathy, ischemic - EF ~40-45% with distal Anterior - Antero& InferoApical hypokinesis. 11/29/2012   F/U Echo 02/2013: EF 50-55%. Mild anterior apical and apical lateral HK, Gr 1 DD   Diabetes mellitus    HISTORY OF: ST elevation myocardial infarction (STEMI) of inferior wall 11/29/2012    99% LAD occlusion - PCI with DES   Hyperlipidemia    Hypertension, essential    Low back pain    Presence of drug coated stent in LAD coronary artery 11/29/2012   Xience eXpedition DES 3.0 mm x 18 mm - (3.4 mm)    Past Surgical History:  Procedure Laterality Date   LEFT HEART CATHETERIZATION WITH CORONARY ANGIOGRAM N/A 11/29/2012   Procedure: LEFT HEART CATHETERIZATION WITH CORONARY ANGIOGRAM;  Surgeon: Leonie Man, MD;  Location[facility] CATH LAB;  Service: Cardiovascular;  Laterality: N/A;   NECK SURGERY     resection benign tumor   PERCUTANEOUS CORONARY STENT INTERVENTION (PCI-S) N/A 11/29/2012    Procedure: PERCUTANEOUS CORONARY STENT INTERVENTION (PCI-S);  Surgeon: Leonie Man, MD;  Location: [facility] CATH LAB;;; 99% thrombotic LAD occlusion - Xience Expedition 3.0 mm x 18 mm - post-dilated to 3.4 mm   TRANSTHORACIC ECHOCARDIOGRAM  03/12/2013   Post MI: Low normal EF (50-55%), apical false tendon.  Mild HK of apical anterior and apical lateral wall.  Grade 1 diastolic dysfunction.    Prior to Admission medications   Medication Sig Start Date End Date Taking? Authorizing Provider  atorvastatin (LIPITOR) 80 MG tablet TAKE 1 TABLET BY MOUTH EVERY DAY 02/16/21  Yes Isaac Bliss, Rayford Halsted, MD  carvedilol (COREG) 12.5 MG tablet TAKE 1 TABLET BY MOUTH TWICE A DAY WITH MEALS 04/04/21  Yes Isaac Bliss, Rayford Halsted, MD  ezetimibe (ZETIA) 10 MG tablet TAKE 1 TABLET BY MOUTH EVERY DAY 04/04/21  Yes Isaac Bliss, Rayford Halsted, MD  glipiZIDE (GLUCOTROL XL) 10 MG 24 hr tablet TAKE 1 TABLET BY MOUTH EVERY DAY WITH BREAKFAST 04/04/21  Yes Isaac Bliss, Rayford Halsted, MD  glucose blood test strip Check glucose level before each meal ONE TOUCH VERIO TEST STRIP 12/30/15  Yes Marletta Lor, MD  lisinopril (ZESTRIL) 10 MG tablet TAKE 1 TABLET BY MOUTH EVERY DAY 04/04/21  Yes Isaac Bliss, Rayford Halsted, MD  metFORMIN (GLUCOPHAGE) 1000 MG tablet TAKE 1 TABLET BY MOUTH TWICE A DAY WITH MEALS 04/04/21  Yes Isaac Bliss, Rayford Halsted, MD  TRULICITY 3 ST/4.1DQ SOPN INJECT 3 MG AS DIRECTED ONCE A  WEEK. 03/25/21 06/23/21 Yes Isaac Bliss, Rayford Halsted, MD  clopidogrel (PLAVIX) 75 MG tablet TAKE 1 TABLET BY MOUTH EVERY DAY 02/16/21   Isaac Bliss, Rayford Halsted, MD    Current Outpatient Medications  Medication Sig Dispense Refill   atorvastatin (LIPITOR) 80 MG tablet TAKE 1 TABLET BY MOUTH EVERY DAY 90 tablet 1   carvedilol (COREG) 12.5 MG tablet TAKE 1 TABLET BY MOUTH TWICE A DAY WITH MEALS 180 tablet 1   ezetimibe (ZETIA) 10 MG tablet TAKE 1 TABLET BY MOUTH EVERY DAY 90 tablet 1   glipiZIDE (GLUCOTROL XL) 10 MG 24  hr tablet TAKE 1 TABLET BY MOUTH EVERY DAY WITH BREAKFAST 90 tablet 1   glucose blood test strip Check glucose level before each meal ONE TOUCH VERIO TEST STRIP 100 each 12   lisinopril (ZESTRIL) 10 MG tablet TAKE 1 TABLET BY MOUTH EVERY DAY 90 tablet 1   metFORMIN (GLUCOPHAGE) 1000 MG tablet TAKE 1 TABLET BY MOUTH TWICE A DAY WITH MEALS 962 tablet 1   TRULICITY 3 EZ/6.6QH SOPN INJECT 3 MG AS DIRECTED ONCE A WEEK. 6 mL 1   clopidogrel (PLAVIX) 75 MG tablet TAKE 1 TABLET BY MOUTH EVERY DAY 90 tablet 1   Current Facility-Administered Medications  Medication Dose Route Frequency Provider Last Rate Last Admin   0.9 %  sodium chloride infusion  500 mL Intravenous Once Sharyn Creamer, MD       dextrose 5 % solution   Intravenous Once Sharyn Creamer, MD        Allergies as of 05/20/2021 - Review Complete 05/20/2021  Allergen Reaction Noted   Hydromet [hydrocodone bit-homatrop mbr]  01/02/2014   Iodine  08/10/2006   Ultram [tramadol]  01/02/2014    Family History  Problem Relation Age of Onset   Healthy Other        Noncontributory; he is not very aware of his family's history.    Social History   Socioeconomic History   Marital status: Single    Spouse name: Not on file   Number of children: Not on file   Years of education: Not on file   Highest education level: Not on file  Occupational History   Not on file  Tobacco Use   Smoking status: Never   Smokeless tobacco: Never  Substance and Sexual Activity   Alcohol use: No   Drug use: Not Currently   Sexual activity: Not on file  Other Topics Concern   Not on file  Social History Narrative   He is married, lives in [place].  He works for [employer].   He is now back very active, walking several miles a day.  He is anxious to go back to work and to start cardiac rehabilitation.  He does not drink and does not smoke.   Social Determinants of Health   Financial Resource Strain: Not on file  Food  Insecurity: Not on file  Transportation Needs: Not on file  Physical Activity: Not on file  Stress: Not on file  Social Connections: Not on file  Intimate Partner Violence: Not on file    Physical Exam: Vital signs in last 24 hours: BP (!) 149/93    Pulse 89    Temp 97.8 F (36.6 C)    Ht 5\' 7"  (1.702 m)    Wt 187 lb (84.8 kg)    SpO2 97%    BMI 29.29 kg/m  GEN: NAD EYE: Sclerae anicteric ENT: MMM CV: Non-tachycardic Pulm:  No increased WOB GI: Soft NEURO:  Alert & Oriented   Christia Reading, MD [facility]   05/20/2021 9:33 AM

## 2021-05-20 NOTE — Progress Notes (Signed)
Called to room to assist during endoscopic procedure.  Patient ID and intended procedure confirmed with present staff. Received instructions for my participation in the procedure from the performing physician.  

## 2021-05-25 ENCOUNTER — Telehealth: Payer: Self-pay

## 2021-05-25 ENCOUNTER — Telehealth: Payer: Self-pay | Admitting: *Deleted

## 2021-05-25 NOTE — Telephone Encounter (Signed)
Opened in error. SChaplin, RN,BSN  

## 2021-05-25 NOTE — Telephone Encounter (Signed)
°  Follow up Call-  Call back number 05/20/2021  Post procedure Call Back phone  # [phone]  Permission to leave phone message Yes  Some recent data might be hidden     Patient questions:  Do you have a fever, pain , or abdominal swelling? No. Pain Score  0 *  Have you tolerated food without any problems? Yes.    Have you been able to return to your normal activities? Yes.    Do you have any questions about your discharge instructions: Diet   No. Medications  No. Follow up visit  No.  Do you have questions or concerns about your Care? No.  Actions: * If pain score is 4 or above: No action needed, pain <4. Entered in error this patient not reached on f/u callback ,left message

## 2021-05-27 ENCOUNTER — Encounter: Payer: Self-pay | Admitting: Internal Medicine

## 2021-07-23 ENCOUNTER — Other Ambulatory Visit: Payer: Self-pay | Admitting: Internal Medicine

## 2021-08-17 ENCOUNTER — Ambulatory Visit (INDEPENDENT_AMBULATORY_CARE_PROVIDER_SITE_OTHER): Payer: BC Managed Care – PPO | Admitting: Internal Medicine

## 2021-08-17 VITALS — BP 120/70 | HR 76 | Temp 98.0°F | Wt 185.9 lb

## 2021-08-17 DIAGNOSIS — E1169 Type 2 diabetes mellitus with other specified complication: Secondary | ICD-10-CM | POA: Diagnosis not present

## 2021-08-17 DIAGNOSIS — Z9861 Coronary angioplasty status: Secondary | ICD-10-CM

## 2021-08-17 DIAGNOSIS — I2510 Atherosclerotic heart disease of native coronary artery without angina pectoris: Secondary | ICD-10-CM

## 2021-08-17 DIAGNOSIS — E785 Hyperlipidemia, unspecified: Secondary | ICD-10-CM | POA: Diagnosis not present

## 2021-08-17 DIAGNOSIS — E1151 Type 2 diabetes mellitus with diabetic peripheral angiopathy without gangrene: Secondary | ICD-10-CM | POA: Diagnosis not present

## 2021-08-17 DIAGNOSIS — I10 Essential (primary) hypertension: Secondary | ICD-10-CM | POA: Diagnosis not present

## 2021-08-17 LAB — POCT GLYCOSYLATED HEMOGLOBIN (HGB A1C): Hemoglobin A1C: 7.2 % — AB (ref 4.0–5.6)

## 2021-08-17 MED ORDER — TRULICITY 1.5 MG/0.5ML ~~LOC~~ SOAJ: 1.5000 mg | SUBCUTANEOUS | 0 refills | Status: AC

## 2021-08-17 NOTE — Progress Notes (Signed)
? ? ? ?Established Patient Office Visit ? ? ? ? ?This visit occurred during the SARS-CoV-2 public health emergency.  Safety protocols were in place, including screening questions prior to the visit, additional usage of staff PPE, and extensive cleaning of exam room while observing appropriate contact time as indicated for disinfecting solutions.  ? ? ?CC/Reason for Visit: 72-monthfollow-up chronic medical conditions ? ?HPI: Matthew Prince a 66y.o. male who is coming in today for the above mentioned reasons. Past Medical History is significant for: Hypertension, hyperlipidemia, type 2 diabetes, coronary artery disease status post non-ST elevated MI and vitamin D deficiency.  At last visit we added ezetimibe to his 80 mg of atorvastatin due to suboptimal LDL, he is due for lipid recheck today.  He quit taking his Trulicity 2 weeks ago because 1 day he noticed his CBG to be 60.  Unsurprisingly his A1c has increased to 7.2 today. ? ? ?Past Medical/Surgical History: ?Past Medical History:  ?Diagnosis Date  ? Allergy   ? BPV (benign positional vertigo) 10/2009  ? CAD S/P percutaneous coronary angioplasty   ? PCI to LAD, the DES  ? Cardiomyopathy, ischemic - EF ~40-45% with distal Anterior - Antero& InferoApical hypokinesis. 11/29/2012  ? F/U Echo 02/2013: EF 50-55%. Mild anterior apical and apical lateral HK, Gr 1 DD  ? Diabetes mellitus   ? HISTORY OF: ST elevation myocardial infarction (STEMI) of inferior wall 11/29/2012  ?  99% LAD occlusion - PCI with DES  ? Hyperlipidemia   ? Hypertension, essential   ? Low back pain   ? Presence of drug coated stent in LAD coronary artery 11/29/2012  ? Xience eXpedition DES 3.0 mm x 18 mm - (3.4 mm)  ? ? ?Past Surgical History:  ?Procedure Laterality Date  ? LEFT HEART CATHETERIZATION WITH CORONARY ANGIOGRAM N/A 11/29/2012  ? Procedure: LEFT HEART CATHETERIZATION WITH CORONARY ANGIOGRAM;  Surgeon: DLeonie Man MD;  [facility];  Service: Cardiovascular;  Laterality: N/A;  ?  NECK SURGERY    ? resection benign tumor  ? PERCUTANEOUS CORONARY STENT INTERVENTION (PCI-S) N/A 11/29/2012  ? Procedure: PERCUTANEOUS CORONARY STENT INTERVENTION (PCI-S);  Surgeon: DLeonie Man MD;  [facility];;; 99% thrombotic LAD occlusion - Xience Expedition 3.0 mm x 18 mm - post-dilated to 3.4 mm  ? TRANSTHORACIC ECHOCARDIOGRAM  03/12/2013  ? Post MI: Low normal EF (50-55%), apical false tendon.  Mild HK of apical anterior and apical lateral wall.  Grade 1 diastolic dysfunction.  ? ? ?Social History: ? reports that he has never smoked. He has never used smokeless tobacco. He reports that he does not currently use drugs. He reports that he does not drink alcohol. ? ?Allergies: ?Allergies  ?Allergen Reactions  ? Hydromet [Hydrocodone Bit-Homatrop Mbr]   ?  Lip swelling with concomitant use with tramadol  ? Iodine   ?  REACTION: unspecified  ? Ultram [Tramadol]   ?  Lip swelling with concomitant use of Hytromet  ? ? ?Family History:  ?Family History  ?Problem Relation Age of Onset  ? Healthy Other   ?     Noncontributory; he is not very aware of his family's history.  ? ? ? ?Current Outpatient Medications:  ?  atorvastatin (LIPITOR) 80 MG tablet, TAKE 1 TABLET BY MOUTH EVERY DAY, Disp: 90 tablet, Rfl: 1 ?  carvedilol (COREG) 12.5 MG tablet, TAKE 1 TABLET BY MOUTH TWICE A DAY WITH MEALS, Disp: 180 tablet, Rfl: 1 ?  clopidogrel (  PLAVIX) 75 MG tablet, TAKE 1 TABLET BY MOUTH EVERY DAY, Disp: 90 tablet, Rfl: 1 ?  Dulaglutide (TRULICITY) 1.5 HE/5.2DP SOPN, Inject 1.5 mg into the skin once a week., Disp: 6 mL, Rfl: 0 ?  ezetimibe (ZETIA) 10 MG tablet, TAKE 1 TABLET BY MOUTH EVERY DAY, Disp: 90 tablet, Rfl: 1 ?  glucose blood test strip, Check glucose level before each meal ONE TOUCH VERIO TEST STRIP, Disp: 100 each, Rfl: 12 ?  lisinopril (ZESTRIL) 10 MG tablet, TAKE 1 TABLET BY MOUTH EVERY DAY, Disp: 90 tablet, Rfl: 1 ?  metFORMIN (GLUCOPHAGE) 1000 MG tablet, TAKE 1 TABLET BY MOUTH TWICE A DAY WITH MEALS,  Disp: 180 tablet, Rfl: 1 ? ?Current Facility-Administered Medications:  ?  dextrose 5 % solution, , Intravenous, Once, Sharyn Creamer, MD ? ?Review of Systems:  ?Constitutional: Denies fever, chills, diaphoresis, appetite change and fatigue.  ?HEENT: Denies photophobia, eye pain, redness, hearing loss, ear pain, congestion, sore throat, rhinorrhea, sneezing, mouth sores, trouble swallowing, neck pain, neck stiffness and tinnitus.   ?Respiratory: Denies SOB, DOE, cough, chest tightness,  and wheezing.   ?Cardiovascular: Denies chest pain, palpitations and leg swelling.  ?Gastrointestinal: Denies nausea, vomiting, abdominal pain, diarrhea, constipation, blood in stool and abdominal distention.  ?Genitourinary: Denies dysuria, urgency, frequency, hematuria, flank pain and difficulty urinating.  ?Endocrine: Denies: hot or cold intolerance, sweats, changes in hair or nails, polyuria, polydipsia. ?Musculoskeletal: Denies myalgias, back pain, joint swelling, arthralgias and gait problem.  ?Skin: Denies pallor, rash and wound.  ?Neurological: Denies dizziness, seizures, syncope, weakness, light-headedness, numbness and headaches.  ?Hematological: Denies adenopathy. Easy bruising, personal or family bleeding history  ?Psychiatric/Behavioral: Denies suicidal ideation, mood changes, confusion, nervousness, sleep disturbance and agitation ? ? ? ?Physical Exam: ?Vitals:  ? 08/17/21 1518  ?BP: 120/70  ?Pulse: 76  ?Temp: 98 ?F (36.7 ?C)  ?TempSrc: Oral  ?SpO2: 98%  ?Weight: 185 lb 14.4 oz (84.3 kg)  ? ? ?Body mass index is 29.12 kg/m?. ? ? ?Constitutional: NAD, calm, comfortable ?Eyes: PERRL, lids and conjunctivae normal ?ENMT: Mucous membranes are moist.  ?Respiratory: clear to auscultation bilaterally, no wheezing, no crackles. Normal respiratory effort. No accessory muscle use.  ?Cardiovascular: Regular rate and rhythm, no murmurs / rubs / gallops. No extremity edema. ?Neurologic: Grossly intact and nonfocal ?Psychiatric:  Normal judgment and insight. Alert and oriented x 3. Normal mood.  ? ? ?Impression and Plan: ? ?DM (diabetes mellitus), type 2 with peripheral vascular complications ([place]) - Plan: POCT glycosylated hemoglobin (Hb A1C), Dulaglutide (TRULICITY) 1.5 OE/4.2PN SOPN ? ?Hyperlipidemia associated with type 2 diabetes mellitus ([place]) - Plan: Lipid panel ? ?Hypertension, essential ? ?CAD (coronary artery disease), native coronary artery - (STEMI) LAD 99% Thrombosis - PCI with Xience eXpedition 3.0 mm x `18 mm (3.4 mm post) ? ?-Blood pressure is well controlled. ?-Check lipids today, goal LDL less than 70. ?-A1c has increased to 7.2.  Do not believe Trulicity is causing hypoglycemia, glipizide might be at fault.  Continue metformin, resume Trulicity at 1.5 mg, discontinue glipizide.  Return in 3 months for follow-up. ? ?Time spent: 34 minutes reviewing chart, interviewing and examining patient and formulating plan of care. ? ? ?Patient Instructions  ?-Nice seeing you today!! ? ?-Lab work today; will notify you once results are available. ? ?-Resume trulicity 1.5 mg injected weekly. ? ?-Stop glipizide; continue metformin. ? ?-Schedule follow up in 3 months. ? ? ? ?Lelon Frohlich, MD ?[facility] ? ? ?

## 2021-08-17 NOTE — Patient Instructions (Signed)
-  Nice seeing you today!! ? ?-Lab work today; will notify you once results are available. ? ?-Resume trulicity 1.5 mg injected weekly. ? ?-Stop glipizide; continue metformin. ? ?-Schedule follow up in 3 months. ?

## 2021-08-18 LAB — LIPID PANEL
Cholesterol: 88 mg/dL (ref 0–200)
HDL: 24.4 mg/dL — ABNORMAL LOW (ref >39.00)
LDL Cholesterol: 37 mg/dL (ref 0–99)
NonHDL: 63.3
Total CHOL/HDL Ratio: 4
Triglycerides: 134 mg/dL (ref 0.0–149.0)
VLDL: 26.8 mg/dL (ref 0.0–40.0)

## 2021-10-13 ENCOUNTER — Other Ambulatory Visit: Payer: Self-pay | Admitting: Internal Medicine

## 2021-10-13 DIAGNOSIS — E118 Type 2 diabetes mellitus with unspecified complications: Secondary | ICD-10-CM

## 2021-10-17 ENCOUNTER — Other Ambulatory Visit: Payer: Self-pay | Admitting: Internal Medicine

## 2021-10-17 DIAGNOSIS — E118 Type 2 diabetes mellitus with unspecified complications: Secondary | ICD-10-CM

## 2021-10-17 DIAGNOSIS — Z0000 Encounter for general adult medical examination without abnormal findings: Secondary | ICD-10-CM

## 2021-11-11 ENCOUNTER — Other Ambulatory Visit: Payer: Self-pay | Admitting: Internal Medicine

## 2021-11-11 DIAGNOSIS — E1169 Type 2 diabetes mellitus with other specified complication: Secondary | ICD-10-CM

## 2021-11-24 ENCOUNTER — Encounter: Payer: Self-pay | Admitting: Internal Medicine

## 2021-11-24 ENCOUNTER — Ambulatory Visit (INDEPENDENT_AMBULATORY_CARE_PROVIDER_SITE_OTHER): Payer: BC Managed Care – PPO | Admitting: Internal Medicine

## 2021-11-24 VITALS — BP 120/80 | HR 80 | Temp 97.7°F | Wt 184.8 lb

## 2021-11-24 DIAGNOSIS — E1169 Type 2 diabetes mellitus with other specified complication: Secondary | ICD-10-CM

## 2021-11-24 DIAGNOSIS — I2510 Atherosclerotic heart disease of native coronary artery without angina pectoris: Secondary | ICD-10-CM

## 2021-11-24 DIAGNOSIS — E1151 Type 2 diabetes mellitus with diabetic peripheral angiopathy without gangrene: Secondary | ICD-10-CM | POA: Diagnosis not present

## 2021-11-24 DIAGNOSIS — I10 Essential (primary) hypertension: Secondary | ICD-10-CM | POA: Diagnosis not present

## 2021-11-24 DIAGNOSIS — E559 Vitamin D deficiency, unspecified: Secondary | ICD-10-CM | POA: Diagnosis not present

## 2021-11-24 DIAGNOSIS — E785 Hyperlipidemia, unspecified: Secondary | ICD-10-CM

## 2021-11-24 DIAGNOSIS — Z9861 Coronary angioplasty status: Secondary | ICD-10-CM

## 2021-11-24 LAB — POCT GLYCOSYLATED HEMOGLOBIN (HGB A1C): Hemoglobin A1C: 7.7 % — AB (ref 4.0–5.6)

## 2021-11-24 NOTE — Progress Notes (Signed)
Established Patient Office Visit     CC/Reason for Visit: 3 month follow up chronic medical conditions  HPI: Matthew Prince is a 66 y.o. male who is coming in today for the above mentioned reasons. Past Medical History is significant for: Hypertension, hyperlipidemia, type 2 diabetes, coronary artery disease status post non-ST elevated MI and vitamin D deficiency.  He admits to significant dietary indiscretions.  He did resume his Trulicity at last visit.  He is requesting his vitamin D be checked.   Past Medical/Surgical History: Past Medical History:  Diagnosis Date   Allergy    BPV (benign positional vertigo) 10/2009   CAD S/P percutaneous coronary angioplasty    PCI to LAD, the DES   Cardiomyopathy, ischemic - EF ~40-45% with distal Anterior - Antero& InferoApical hypokinesis. 11/29/2012   F/U Echo 02/2013: EF 50-55%. Mild anterior apical and apical lateral HK, Gr 1 DD   Diabetes mellitus    HISTORY OF: ST elevation myocardial infarction (STEMI) of inferior wall 11/29/2012    99% LAD occlusion - PCI with DES   Hyperlipidemia    Hypertension, essential    Low back pain    Presence of drug coated stent in LAD coronary artery 11/29/2012   Xience eXpedition DES 3.0 mm x 18 mm - (3.4 mm)    Past Surgical History:  Procedure Laterality Date   LEFT HEART CATHETERIZATION WITH CORONARY ANGIOGRAM N/A 11/29/2012   Procedure: LEFT HEART CATHETERIZATION WITH CORONARY ANGIOGRAM;  Surgeon: Leonie Man, MD;  Location[facility] LAB;  Service: Cardiovascular;  Laterality: N/A;   NECK SURGERY     resection benign tumor   PERCUTANEOUS CORONARY STENT INTERVENTION (PCI-S) N/A 11/29/2012   Procedure: PERCUTANEOUS CORONARY STENT INTERVENTION (PCI-S);  Surgeon: Leonie Man, MD;  Location: [facility] CATH LAB;;; 99% thrombotic LAD occlusion - Xience Expedition 3.0 mm x 18 mm - post-dilated to 3.4 mm   TRANSTHORACIC ECHOCARDIOGRAM  03/12/2013   Post MI: Low normal EF (50-55%), apical false tendon.  Mild HK of  apical anterior and apical lateral wall.  Grade 1 diastolic dysfunction.    Social History:  reports that he has never smoked. He has never used smokeless tobacco. He reports that he does not currently use drugs. He reports that he does not drink alcohol.  Allergies: Allergies  Allergen Reactions   Hydromet [Hydrocodone Bit-Homatrop Mbr]     Lip swelling with concomitant use with tramadol   Iodine     REACTION: unspecified   Ultram [Tramadol]     Lip swelling with concomitant use of Hytromet    Family History:  Family History  Problem Relation Age of Onset   Healthy Other        Noncontributory; he is not very aware of his family's history.     Current Outpatient Medications:    atorvastatin (LIPITOR) 80 MG tablet, TAKE 1 TABLET BY MOUTH EVERY DAY, Disp: 90 tablet, Rfl: 1   carvedilol (COREG) 12.5 MG tablet, TAKE 1 TABLET BY MOUTH TWICE A DAY WITH MEALS, Disp: 180 tablet, Rfl: 1   clopidogrel (PLAVIX) 75 MG tablet, TAKE 1 TABLET BY MOUTH EVERY DAY, Disp: 90 tablet, Rfl: 1   ezetimibe (ZETIA) 10 MG tablet, TAKE 1 TABLET BY MOUTH EVERY DAY, Disp: 90 tablet, Rfl: 1   glucose blood test strip, Check glucose level before each meal ONE TOUCH VERIO TEST STRIP, Disp: 100 each, Rfl: 12   lisinopril (ZESTRIL) 10 MG tablet, TAKE 1 TABLET BY MOUTH EVERY DAY,  Disp: 90 tablet, Rfl: 1   metFORMIN (GLUCOPHAGE) 1000 MG tablet, TAKE 1 TABLET BY MOUTH TWICE A DAY WITH MEALS, Disp: 180 tablet, Rfl: 1   TRULICITY 3 BU/3.8GT SOPN, INJECT 3 MG AS DIRECTED ONCE A WEEK., Disp: 1.5 mL, Rfl: 1  Current Facility-Administered Medications:    dextrose 5 % solution, , Intravenous, Once, Sharyn Creamer, MD  Review of Systems:  Constitutional: Denies fever, chills, diaphoresis, appetite change and fatigue.  HEENT: Denies photophobia, eye pain, redness, hearing loss, ear pain, congestion, sore throat, rhinorrhea, sneezing, mouth sores, trouble swallowing, neck pain, neck stiffness and tinnitus.   Respiratory:  Denies SOB, DOE, cough, chest tightness,  and wheezing.   Cardiovascular: Denies chest pain, palpitations and leg swelling.  Gastrointestinal: Denies nausea, vomiting, abdominal pain, diarrhea, constipation, blood in stool and abdominal distention.  Genitourinary: Denies dysuria, urgency, frequency, hematuria, flank pain and difficulty urinating.  Endocrine: Denies: hot or cold intolerance, sweats, changes in hair or nails, polyuria, polydipsia. Musculoskeletal: Denies myalgias, back pain, joint swelling, arthralgias and gait problem.  Skin: Denies pallor, rash and wound.  Neurological: Denies dizziness, seizures, syncope, weakness, light-headedness, numbness and headaches.  Hematological: Denies adenopathy. Easy bruising, personal or family bleeding history  Psychiatric/Behavioral: Denies suicidal ideation, mood changes, confusion, nervousness, sleep disturbance and agitation    Physical Exam: Vitals:   11/24/21 1502  BP: 120/80  Pulse: 80  Temp: 97.7 F (36.5 C)  TempSrc: Oral  SpO2: 98%  Weight: 184 lb 12.8 oz (83.8 kg)    Body mass index is 28.94 kg/m.   Constitutional: NAD, calm, comfortable Eyes: PERRL, lids and conjunctivae normal ENMT: Mucous membranes are moist. Respiratory: clear to auscultation bilaterally, no wheezing, no crackles. Normal respiratory effort. No accessory muscle use.  Cardiovascular: Regular rate and rhythm, no murmurs / rubs / gallops. No extremity edema.  Psychiatric: Normal judgment and insight. Alert and oriented x 3. Normal mood.    Impression and Plan:  DM (diabetes mellitus), type 2 with peripheral vascular complications ([place]) - Plan: POCT glycosylated hemoglobin (Hb A1C), Urine microalbumin-creatinine with uACR  Vitamin D deficiency - Plan: VITAMIN D 25 Hydroxy (Vit-D Deficiency, Fractures)  Hyperlipidemia associated with type 2 diabetes mellitus (HCC)  Hypertension, essential  CAD (coronary artery disease), native coronary artery -  (STEMI) LAD 99% Thrombosis - PCI with Xience eXpedition 3.0 mm x `18 mm (3.4 mm post)  -We will check vitamin D levels today. -A1c has increased from 7.2-7.7.  He will work on lifestyle changes, continue metformin, Trulicity.  He has discontinued glipizide as he tends to be hypoglycemic while on it. -Recent lipid panel in March with an LDL of 37, continue maximum dose atorvastatin and ezetimibe.  Time spent:31 minutes reviewing chart, interviewing and examining patient and formulating plan of care    Katiria Calame Isaac Bliss, MD [facility]

## 2021-11-25 LAB — VITAMIN D 25 HYDROXY (VIT D DEFICIENCY, FRACTURES): VITD: 22.76 ng/mL — ABNORMAL LOW (ref 30.00–100.00)

## 2021-11-25 LAB — MICROALBUMIN / CREATININE URINE RATIO
Creatinine,U: 170.2 mg/dL
Microalb Creat Ratio: 0.9 mg/g (ref 0.0–30.0)
Microalb, Ur: 1.6 mg/dL (ref 0.0–1.9)

## 2021-11-30 ENCOUNTER — Other Ambulatory Visit: Payer: Self-pay | Admitting: Internal Medicine

## 2021-11-30 DIAGNOSIS — E559 Vitamin D deficiency, unspecified: Secondary | ICD-10-CM

## 2021-11-30 MED ORDER — VITAMIN D (ERGOCALCIFEROL) 1.25 MG (50000 UNIT) PO CAPS: 50000.0000 [IU] | ORAL_CAPSULE | ORAL | 0 refills | Status: DC

## 2022-01-22 ENCOUNTER — Other Ambulatory Visit: Payer: Self-pay | Admitting: Internal Medicine

## 2022-01-27 ENCOUNTER — Other Ambulatory Visit: Payer: Self-pay | Admitting: Internal Medicine

## 2022-01-27 DIAGNOSIS — E118 Type 2 diabetes mellitus with unspecified complications: Secondary | ICD-10-CM

## 2022-02-15 ENCOUNTER — Other Ambulatory Visit: Payer: Self-pay | Admitting: Internal Medicine

## 2022-02-15 DIAGNOSIS — E559 Vitamin D deficiency, unspecified: Secondary | ICD-10-CM

## 2022-02-28 ENCOUNTER — Other Ambulatory Visit: Payer: Self-pay | Admitting: Internal Medicine

## 2022-02-28 ENCOUNTER — Ambulatory Visit (INDEPENDENT_AMBULATORY_CARE_PROVIDER_SITE_OTHER): Payer: BC Managed Care – PPO | Admitting: Internal Medicine

## 2022-02-28 ENCOUNTER — Encounter: Payer: Self-pay | Admitting: Internal Medicine

## 2022-02-28 VITALS — BP 122/78 | HR 88 | Temp 98.1°F | Resp 18 | Ht 67.32 in | Wt 181.1 lb

## 2022-02-28 DIAGNOSIS — E559 Vitamin D deficiency, unspecified: Secondary | ICD-10-CM | POA: Diagnosis not present

## 2022-02-28 DIAGNOSIS — E785 Hyperlipidemia, unspecified: Secondary | ICD-10-CM | POA: Diagnosis not present

## 2022-02-28 DIAGNOSIS — Z9861 Coronary angioplasty status: Secondary | ICD-10-CM

## 2022-02-28 DIAGNOSIS — Z125 Encounter for screening for malignant neoplasm of prostate: Secondary | ICD-10-CM | POA: Diagnosis not present

## 2022-02-28 DIAGNOSIS — Z23 Encounter for immunization: Secondary | ICD-10-CM | POA: Diagnosis not present

## 2022-02-28 DIAGNOSIS — Z0000 Encounter for general adult medical examination without abnormal findings: Secondary | ICD-10-CM | POA: Diagnosis not present

## 2022-02-28 DIAGNOSIS — E1129 Type 2 diabetes mellitus with other diabetic kidney complication: Secondary | ICD-10-CM | POA: Insufficient documentation

## 2022-02-28 DIAGNOSIS — E1151 Type 2 diabetes mellitus with diabetic peripheral angiopathy without gangrene: Secondary | ICD-10-CM

## 2022-02-28 DIAGNOSIS — E1169 Type 2 diabetes mellitus with other specified complication: Secondary | ICD-10-CM

## 2022-02-28 DIAGNOSIS — E118 Type 2 diabetes mellitus with unspecified complications: Secondary | ICD-10-CM

## 2022-02-28 DIAGNOSIS — I10 Essential (primary) hypertension: Secondary | ICD-10-CM

## 2022-02-28 DIAGNOSIS — I2510 Atherosclerotic heart disease of native coronary artery without angina pectoris: Secondary | ICD-10-CM

## 2022-02-28 LAB — MICROALBUMIN / CREATININE URINE RATIO
Creatinine,U: 161.3 mg/dL
Microalb Creat Ratio: 1.9 mg/g (ref 0.0–30.0)
Microalb, Ur: 3 mg/dL — ABNORMAL HIGH (ref 0.0–1.9)

## 2022-02-28 LAB — CBC WITH DIFFERENTIAL/PLATELET
Basophils Absolute: 0 10*3/uL (ref 0.0–0.1)
Basophils Relative: 0.7 % (ref 0.0–3.0)
Eosinophils Absolute: 0.1 10*3/uL (ref 0.0–0.7)
Eosinophils Relative: 2 % (ref 0.0–5.0)
HCT: 41.2 % (ref 39.0–52.0)
Hemoglobin: 13.5 g/dL (ref 13.0–17.0)
Lymphocytes Relative: 48.6 % — ABNORMAL HIGH (ref 12.0–46.0)
Lymphs Abs: 2.6 10*3/uL (ref 0.7–4.0)
MCHC: 32.8 g/dL (ref 30.0–36.0)
MCV: 90.5 fl (ref 78.0–100.0)
Monocytes Absolute: 0.5 10*3/uL (ref 0.1–1.0)
Monocytes Relative: 9.6 % (ref 3.0–12.0)
Neutro Abs: 2.1 10*3/uL (ref 1.4–7.7)
Neutrophils Relative %: 39.1 % — ABNORMAL LOW (ref 43.0–77.0)
Platelets: 256 10*3/uL (ref 150.0–400.0)
RBC: 4.55 Mil/uL (ref 4.22–5.81)
RDW: 14 % (ref 11.5–15.5)
WBC: 5.4 10*3/uL (ref 4.0–10.5)

## 2022-02-28 LAB — COMPREHENSIVE METABOLIC PANEL
ALT: 37 U/L (ref 0–53)
AST: 25 U/L (ref 0–37)
Albumin: 4.7 g/dL (ref 3.5–5.2)
Alkaline Phosphatase: 46 U/L (ref 39–117)
BUN: 9 mg/dL (ref 6–23)
CO2: 29 mEq/L (ref 19–32)
Calcium: 9.9 mg/dL (ref 8.4–10.5)
Chloride: 102 mEq/L (ref 96–112)
Creatinine, Ser: 0.95 mg/dL (ref 0.40–1.50)
GFR: 84.65 mL/min (ref >60.00)
Glucose, Bld: 135 mg/dL — ABNORMAL HIGH (ref 70–99)
Potassium: 4.6 mEq/L (ref 3.5–5.1)
Sodium: 138 mEq/L (ref 135–145)
Total Bilirubin: 0.6 mg/dL (ref 0.2–1.2)
Total Protein: 7.3 g/dL (ref 6.0–8.3)

## 2022-02-28 LAB — LIPID PANEL
Cholesterol: 76 mg/dL (ref 0–200)
HDL: 21.6 mg/dL — ABNORMAL LOW (ref >39.00)
LDL Cholesterol: 43 mg/dL (ref 0–99)
NonHDL: 54.7
Total CHOL/HDL Ratio: 4
Triglycerides: 59 mg/dL (ref 0.0–149.0)
VLDL: 11.8 mg/dL (ref 0.0–40.0)

## 2022-02-28 LAB — HEMOGLOBIN A1C: Hgb A1c MFr Bld: 8.1 % — ABNORMAL HIGH (ref 4.6–6.5)

## 2022-02-28 LAB — PSA: PSA: 0.75 ng/mL (ref 0.10–4.00)

## 2022-02-28 LAB — VITAMIN D 25 HYDROXY (VIT D DEFICIENCY, FRACTURES): VITD: 37.55 ng/mL (ref 30.00–100.00)

## 2022-02-28 MED ORDER — TRULICITY 3 MG/0.5ML ~~LOC~~ SOAJ: 3.0000 mg | SUBCUTANEOUS | 0 refills | Status: DC

## 2022-02-28 NOTE — Progress Notes (Signed)
Established Patient Office Visit     CC/Reason for Visit: Annual preventive exam  HPI: Matthew Prince is a 66 y.o. male who is coming in today for the above mentioned reasons. Past Medical History is significant for: Hypertension, hyperlipidemia, type 2 diabetes, coronary artery disease status post non-ST elevated MI and vitamin D deficiency.  He is doing well and has no acute concerns.  He would like 67-ELF supply of Trulicity be sent in.  He is overdue for diabetic eye exam and will schedule, dental exam is up-to-date.  He is due for PSA, had a colonoscopy in 2022.  He is due for COVID, flu, Tdap vaccines.   Past Medical/Surgical History: Past Medical History:  Diagnosis Date   Allergy    BPV (benign positional vertigo) 10/2009   CAD S/P percutaneous coronary angioplasty    PCI to LAD, the DES   Cardiomyopathy, ischemic - EF ~40-45% with distal Anterior - Antero& InferoApical hypokinesis. 11/29/2012   F/U Echo 02/2013: EF 50-55%. Mild anterior apical and apical lateral HK, Gr 1 DD   Diabetes mellitus    HISTORY OF: ST elevation myocardial infarction (STEMI) of inferior wall 11/29/2012    99% LAD occlusion - PCI with DES   Hyperlipidemia    Hypertension, essential    Low back pain    Presence of drug coated stent in LAD coronary artery 11/29/2012   Xience eXpedition DES 3.0 mm x 18 mm - (3.4 mm)    Past Surgical History:  Procedure Laterality Date   LEFT HEART CATHETERIZATION WITH CORONARY ANGIOGRAM N/A 11/29/2012   Procedure: LEFT HEART CATHETERIZATION WITH CORONARY ANGIOGRAM;  Surgeon: Leonie Man, MD;  Location: [facility] CATH LAB;  Service: Cardiovascular;  Laterality: N/A;   NECK SURGERY     resection benign tumor   PERCUTANEOUS CORONARY STENT INTERVENTION (PCI-S) N/A 11/29/2012   Procedure: PERCUTANEOUS CORONARY STENT INTERVENTION (PCI-S);  Surgeon: Leonie Man, MD;  Location: [facility] CATH LAB;;; 99% thrombotic LAD occlusion - Xience Expedition 3.0 mm x 18 mm - post-dilated to 3.4 mm    TRANSTHORACIC ECHOCARDIOGRAM  03/12/2013   Post MI: Low normal EF (50-55%), apical false tendon.  Mild HK of apical anterior and apical lateral wall.  Grade 1 diastolic dysfunction.    Social History:  reports that he has never smoked. He has never used smokeless tobacco. He reports that he does not currently use drugs. He reports that he does not drink alcohol.  Allergies: Allergies  Allergen Reactions   Hydromet [Hydrocodone Bit-Homatrop Mbr]     Lip swelling with concomitant use with tramadol   Iodine     REACTION: unspecified   Ultram [Tramadol]     Lip swelling with concomitant use of Hytromet    Family History:  Family History  Problem Relation Age of Onset   Healthy Other        Noncontributory; he is not very aware of his family's history.     Current Outpatient Medications:    atorvastatin (LIPITOR) 80 MG tablet, TAKE 1 TABLET BY MOUTH EVERY DAY, Disp: 90 tablet, Rfl: 1   carvedilol (COREG) 12.5 MG tablet, TAKE 1 TABLET BY MOUTH TWICE A DAY WITH MEALS, Disp: 180 tablet, Rfl: 1   clopidogrel (PLAVIX) 75 MG tablet, TAKE 1 TABLET BY MOUTH EVERY DAY, Disp: 90 tablet, Rfl: 1   ezetimibe (ZETIA) 10 MG tablet, TAKE 1 TABLET BY MOUTH EVERY DAY, Disp: 90 tablet, Rfl: 1   glucose blood test strip, Check glucose level  before each meal ONE TOUCH VERIO TEST STRIP, Disp: 100 each, Rfl: 12   lisinopril (ZESTRIL) 10 MG tablet, TAKE 1 TABLET BY MOUTH EVERY DAY, Disp: 90 tablet, Rfl: 1   metFORMIN (GLUCOPHAGE) 1000 MG tablet, TAKE 1 TABLET BY MOUTH TWICE A DAY WITH MEALS, Disp: 180 tablet, Rfl: 1   Vitamin D, Ergocalciferol, (DRISDOL) 1.25 MG (50000 UNIT) CAPS capsule, TAKE 1 CAPSULE (50,000 UNITS TOTAL) BY MOUTH EVERY 7 (SEVEN) DAYS FOR 12 DOSES., Disp: 12 capsule, Rfl: 0   Dulaglutide (TRULICITY) 3 YW/7.3XT SOPN, Inject 3 mg as directed once a week., Disp: 6 mL, Rfl: 0  Current Facility-Administered Medications:    dextrose 5 % solution, , Intravenous, Once, Sharyn Creamer,  MD  Review of Systems:  Constitutional: Denies fever, chills, diaphoresis, appetite change and fatigue.  HEENT: Denies photophobia, eye pain, redness, hearing loss, ear pain, congestion, sore throat, rhinorrhea, sneezing, mouth sores, trouble swallowing, neck pain, neck stiffness and tinnitus.   Respiratory: Denies SOB, DOE, cough, chest tightness,  and wheezing.   Cardiovascular: Denies chest pain, palpitations and leg swelling.  Gastrointestinal: Denies nausea, vomiting, abdominal pain, diarrhea, constipation, blood in stool and abdominal distention.  Genitourinary: Denies dysuria, urgency, frequency, hematuria, flank pain and difficulty urinating.  Endocrine: Denies: hot or cold intolerance, sweats, changes in hair or nails, polyuria, polydipsia. Musculoskeletal: Denies myalgias, back pain, joint swelling, arthralgias and gait problem.  Skin: Denies pallor, rash and wound.  Neurological: Denies dizziness, seizures, syncope, weakness, light-headedness, numbness and headaches.  Hematological: Denies adenopathy. Easy bruising, personal or family bleeding history  Psychiatric/Behavioral: Denies suicidal ideation, mood changes, confusion, nervousness, sleep disturbance and agitation    Physical Exam: Vitals:   02/28/22 0846  BP: 122/78  Pulse: 88  Resp: 18  Temp: 98.1 F (36.7 C)  TempSrc: Oral  SpO2: 96%  Weight: 181 lb 2 oz (82.2 kg)  Height: 5' 7.32" (1.71 m)    Body mass index is 28.1 kg/m.   Constitutional: NAD, calm, comfortable Eyes: PERRL, lids and conjunctivae normal ENMT: Mucous membranes are moist. Posterior pharynx clear of any exudate or lesions. Normal dentition. Tympanic membrane is pearly white, no erythema or bulging. Neck: normal, supple, no masses, no thyromegaly Respiratory: clear to auscultation bilaterally, no wheezing, no crackles. Normal respiratory effort. No accessory muscle use.  Cardiovascular: Regular rate and rhythm, no murmurs / rubs / gallops. No  extremity edema. 2+ pedal pulses. No carotid bruits.  Abdomen: no tenderness, no masses palpated. No hepatosplenomegaly. Bowel sounds positive.  Musculoskeletal: no clubbing / cyanosis. No joint deformity upper and lower extremities. Good ROM, no contractures. Normal muscle tone.  Skin: no rashes, lesions, ulcers. No induration Neurologic: CN 2-12 grossly intact. Sensation intact, DTR normal. Strength 5/5 in all 4.  Psychiatric: Normal judgment and insight. Alert and oriented x 3. Normal mood.   [place] Office Visit from 02/28/2022 in [place] [facility]  PHQ-9 Total Score 1         Impression and Plan:  Encounter for preventive health examination - Plan: PSA, PSA  DM (diabetes mellitus), type 2 with peripheral vascular complications ([place]) - Plan: Microalbumin/Creatinine Ratio, Urine, CBC with Differential/Platelet, Comprehensive metabolic panel, Hemoglobin A1c, Hemoglobin A1c, Comprehensive metabolic panel, CBC with Differential/Platelet, Microalbumin/Creatinine Ratio, Urine  Need for diphtheria-tetanus-pertussis (Tdap) vaccine - Plan: Tdap vaccine greater than or equal to 7yo IM  Need for immunization against influenza - Plan: Flu Vaccine QUAD 12moIM (Fluarix, Fluzone & Alfiuria Quad PF)  Vitamin D deficiency - Plan: VITAMIN  D 25 Hydroxy (Vit-D Deficiency, Fractures), VITAMIN D 25 Hydroxy (Vit-D Deficiency, Fractures)  Hypertension, essential  Hyperlipidemia associated with type 2 diabetes mellitus ([place]) - Plan: Lipid panel, Lipid panel  CAD (coronary artery disease), native coronary artery - (STEMI) LAD 99% Thrombosis - PCI with Xience eXpedition 3.0 mm x `18 mm (3.4 mm post)  Diabetes mellitus with complication ([place]) - Plan: Dulaglutide (TRULICITY) 3 WE/9.9BZ SOPN    -Recommend routine eye and dental care. -Immunizations: Flu and Tdap administered in office today, he will get COVID booster at [place] lifestyle discussed in detail. -Labs to be  updated today. -Colon cancer screening: 04/2021 -Breast cancer screening: Not applicable -Cervical cancer screening: Not applicable -Lung cancer screening: Not applicable -Prostate cancer screening: PSA today -DEXA: Not applicable     Roneisha Stern Isaac Bliss, MD [facility]

## 2022-03-25 ENCOUNTER — Other Ambulatory Visit: Payer: Self-pay | Admitting: Internal Medicine

## 2022-03-25 DIAGNOSIS — E118 Type 2 diabetes mellitus with unspecified complications: Secondary | ICD-10-CM

## 2022-04-18 LAB — HM DIABETES EYE EXAM

## 2022-04-27 ENCOUNTER — Telehealth: Payer: Self-pay | Admitting: Internal Medicine

## 2022-04-27 NOTE — Telephone Encounter (Signed)
Spoke to the patient and informed him that we do not have any samples of Trulicity.  Patient states that he has not had it in 2 weeks.  Patient is aware to call other pharmacies.  Please advise

## 2022-04-27 NOTE — Telephone Encounter (Signed)
Pt called to say the pharmacy is on back order for the Trulicity and would like to know if we have any samples?  Please advise.

## 2022-04-29 ENCOUNTER — Other Ambulatory Visit: Payer: Self-pay | Admitting: Internal Medicine

## 2022-04-29 DIAGNOSIS — Z0000 Encounter for general adult medical examination without abnormal findings: Secondary | ICD-10-CM

## 2022-04-29 DIAGNOSIS — E118 Type 2 diabetes mellitus with unspecified complications: Secondary | ICD-10-CM

## 2022-05-02 NOTE — Telephone Encounter (Signed)
Spoke to [facility] and they do not have any samples of Trulicity and it may be on back order.

## 2022-05-02 NOTE — Telephone Encounter (Signed)
I have reached out to [facility].

## 2022-05-02 NOTE — Telephone Encounter (Signed)
Left message on machine for patient to return our call 

## 2022-05-10 NOTE — Telephone Encounter (Signed)
Left message on machine for patient to return our call 

## 2022-05-14 ENCOUNTER — Other Ambulatory Visit: Payer: Self-pay | Admitting: Internal Medicine

## 2022-05-14 DIAGNOSIS — E559 Vitamin D deficiency, unspecified: Secondary | ICD-10-CM

## 2022-05-16 NOTE — Telephone Encounter (Signed)
Patient states that he now has a 90 day supply of Trulicity.

## 2022-05-24 ENCOUNTER — Other Ambulatory Visit: Payer: Self-pay | Admitting: Internal Medicine

## 2022-05-24 DIAGNOSIS — E1169 Type 2 diabetes mellitus with other specified complication: Secondary | ICD-10-CM

## 2022-05-31 ENCOUNTER — Ambulatory Visit (INDEPENDENT_AMBULATORY_CARE_PROVIDER_SITE_OTHER): Payer: BC Managed Care – PPO | Admitting: Internal Medicine

## 2022-05-31 ENCOUNTER — Encounter: Payer: Self-pay | Admitting: Internal Medicine

## 2022-05-31 VITALS — BP 130/80 | HR 76 | Temp 97.5°F | Wt 182.6 lb

## 2022-05-31 DIAGNOSIS — I2510 Atherosclerotic heart disease of native coronary artery without angina pectoris: Secondary | ICD-10-CM | POA: Diagnosis not present

## 2022-05-31 DIAGNOSIS — I10 Essential (primary) hypertension: Secondary | ICD-10-CM | POA: Diagnosis not present

## 2022-05-31 DIAGNOSIS — E1151 Type 2 diabetes mellitus with diabetic peripheral angiopathy without gangrene: Secondary | ICD-10-CM | POA: Diagnosis not present

## 2022-05-31 DIAGNOSIS — E1169 Type 2 diabetes mellitus with other specified complication: Secondary | ICD-10-CM | POA: Diagnosis not present

## 2022-05-31 DIAGNOSIS — E785 Hyperlipidemia, unspecified: Secondary | ICD-10-CM

## 2022-05-31 DIAGNOSIS — Z9861 Coronary angioplasty status: Secondary | ICD-10-CM

## 2022-05-31 LAB — POCT GLYCOSYLATED HEMOGLOBIN (HGB A1C): Hemoglobin A1C: 7.1 % — AB (ref 4.0–5.6)

## 2022-05-31 NOTE — Progress Notes (Signed)
Established Patient Office Visit     CC/Reason for Visit: Follow-up chronic conditions  HPI: Matthew Prince is a 67 y.o. male who is coming in today for the above mentioned reasons. Past Medical History is significant for: Hypertension, hyperlipidemia, type 2 diabetes, coronary artery disease and vitamin D deficiency.  At last visit his A1c was noted to be elevated at 8.1.  Has been working on lifestyle changes.  He was out of Trulicity for a couple weeks but has been back on it for about a month.  He has no acute concerns or complaints.   Past Medical/Surgical History: Past Medical History:  Diagnosis Date   Allergy    BPV (benign positional vertigo) 10/2009   CAD S/P percutaneous coronary angioplasty    PCI to LAD, the DES   Cardiomyopathy, ischemic - EF ~40-45% with distal Anterior - Antero& InferoApical hypokinesis. 11/29/2012   F/U Echo 02/2013: EF 50-55%. Mild anterior apical and apical lateral HK, Gr 1 DD   Diabetes mellitus    HISTORY OF: ST elevation myocardial infarction (STEMI) of inferior wall 11/29/2012    99% LAD occlusion - PCI with DES   Hyperlipidemia    Hypertension, essential    Low back pain    Presence of drug coated stent in LAD coronary artery 11/29/2012   Xience eXpedition DES 3.0 mm x 18 mm - (3.4 mm)    Past Surgical History:  Procedure Laterality Date   LEFT HEART CATHETERIZATION WITH CORONARY ANGIOGRAM N/A 11/29/2012   Procedure: LEFT HEART CATHETERIZATION WITH CORONARY ANGIOGRAM;  Surgeon: Leonie Man, MD;  Location: [facility] CATH LAB;  Service: Cardiovascular;  Laterality: N/A;   NECK SURGERY     resection benign tumor   PERCUTANEOUS CORONARY STENT INTERVENTION (PCI-S) N/A 11/29/2012   Procedure: PERCUTANEOUS CORONARY STENT INTERVENTION (PCI-S);  Surgeon: Leonie Man, MD;  Location: [facility] CATH LAB;;; 99% thrombotic LAD occlusion - Xience Expedition 3.0 mm x 18 mm - post-dilated to 3.4 mm   TRANSTHORACIC ECHOCARDIOGRAM  03/12/2013   Post MI: Low normal EF  (50-55%), apical false tendon.  Mild HK of apical anterior and apical lateral wall.  Grade 1 diastolic dysfunction.    Social History:  reports that he has never smoked. He has never used smokeless tobacco. He reports that he does not currently use drugs. He reports that he does not drink alcohol.  Allergies: Allergies  Allergen Reactions   Hydromet [Hydrocodone Bit-Homatrop Mbr]     Lip swelling with concomitant use with tramadol   Iodine     REACTION: unspecified   Ultram [Tramadol]     Lip swelling with concomitant use of Hytromet    Family History:  Family History  Problem Relation Age of Onset   Healthy Other        Noncontributory; he is not very aware of his family's history.     Current Outpatient Medications:    atorvastatin (LIPITOR) 80 MG tablet, TAKE 1 TABLET BY MOUTH EVERY DAY, Disp: 90 tablet, Rfl: 1   carvedilol (COREG) 12.5 MG tablet, TAKE 1 TABLET BY MOUTH TWICE A DAY WITH MEALS, Disp: 180 tablet, Rfl: 1   clopidogrel (PLAVIX) 75 MG tablet, TAKE 1 TABLET BY MOUTH EVERY DAY, Disp: 90 tablet, Rfl: 1   ezetimibe (ZETIA) 10 MG tablet, TAKE 1 TABLET BY MOUTH EVERY DAY, Disp: 90 tablet, Rfl: 1   glucose blood test strip, Check glucose level before each meal ONE TOUCH VERIO TEST STRIP, Disp: 100 each, Rfl: 12  lisinopril (ZESTRIL) 10 MG tablet, TAKE 1 TABLET BY MOUTH EVERY DAY, Disp: 90 tablet, Rfl: 1   metFORMIN (GLUCOPHAGE) 1000 MG tablet, TAKE 1 TABLET BY MOUTH TWICE A DAY WITH MEALS, Disp: 180 tablet, Rfl: 1   TRULICITY 3 OI/7.1IW SOPN, INJECT 3 MG AS DIRECTED ONCE A WEEK., Disp: 6 mL, Rfl: 1  Current Facility-Administered Medications:    dextrose 5 % solution, , Intravenous, Once, Sharyn Creamer, MD  Review of Systems:  Constitutional: Denies fever, chills, diaphoresis, appetite change and fatigue.  HEENT: Denies photophobia, eye pain, redness, hearing loss, ear pain, congestion, sore throat, rhinorrhea, sneezing, mouth sores, trouble swallowing, neck pain,  neck stiffness and tinnitus.   Respiratory: Denies SOB, DOE, cough, chest tightness,  and wheezing.   Cardiovascular: Denies chest pain, palpitations and leg swelling.  Gastrointestinal: Denies nausea, vomiting, abdominal pain, diarrhea, constipation, blood in stool and abdominal distention.  Genitourinary: Denies dysuria, urgency, frequency, hematuria, flank pain and difficulty urinating.  Endocrine: Denies: hot or cold intolerance, sweats, changes in hair or nails, polyuria, polydipsia. Musculoskeletal: Denies myalgias, back pain, joint swelling, arthralgias and gait problem.  Skin: Denies pallor, rash and wound.  Neurological: Denies dizziness, seizures, syncope, weakness, light-headedness, numbness and headaches.  Hematological: Denies adenopathy. Easy bruising, personal or family bleeding history  Psychiatric/Behavioral: Denies suicidal ideation, mood changes, confusion, nervousness, sleep disturbance and agitation    Physical Exam: Vitals:   05/31/22 1508  BP: 130/80  Pulse: 76  Temp: (!) 97.5 F (36.4 C)  TempSrc: Oral  SpO2: 99%  Weight: 182 lb 9.6 oz (82.8 kg)    Body mass index is 28.33 kg/m.   Constitutional: NAD, calm, comfortable Eyes: PERRL, lids and conjunctivae normal ENMT: Mucous membranes are moist. Respiratory: clear to auscultation bilaterally, no wheezing, no crackles. Normal respiratory effort. No accessory muscle use.  Cardiovascular: Regular rate and rhythm, no murmurs / rubs / gallops. No extremity edema.   Psychiatric: Normal judgment and insight. Alert and oriented x 3. Normal mood.    Impression and Plan:  DM (diabetes mellitus), type 2 with peripheral vascular complications ([place]) - Plan: POCT glycosylated hemoglobin (Hb A1C)  Hyperlipidemia associated with type 2 diabetes mellitus (HCC)  CAD (coronary artery disease), native coronary artery - (STEMI) LAD 99% Thrombosis - PCI with Xience eXpedition 3.0 mm x `18 mm (3.4 mm post)  Hypertension,  essential  -A1c of 7.1 shows significant improvement from 8.13 months ago.  He will continue to work on lifestyle modifications and take Trulicity weekly. -Blood pressure is fairly well-controlled. -LDL was optimal at 43 as of 10/23  Time spent:33 minutes reviewing chart, interviewing and examining patient and formulating plan of care.      Lelon Frohlich, MD [facility] at [facility]

## 2022-06-01 ENCOUNTER — Encounter: Payer: Self-pay | Admitting: Internal Medicine

## 2022-06-29 ENCOUNTER — Ambulatory Visit (INDEPENDENT_AMBULATORY_CARE_PROVIDER_SITE_OTHER): Payer: BC Managed Care – PPO | Admitting: Internal Medicine

## 2022-06-29 ENCOUNTER — Encounter: Payer: Self-pay | Admitting: Internal Medicine

## 2022-06-29 VITALS — BP 124/80 | HR 87 | Temp 97.9°F | Wt 181.5 lb

## 2022-06-29 DIAGNOSIS — R079 Chest pain, unspecified: Secondary | ICD-10-CM | POA: Diagnosis not present

## 2022-06-29 NOTE — Progress Notes (Signed)
Established Patient Office Visit     CC/Reason for Visit: Chest discomfort  HPI: Matthew Prince is a 67 y.o. male who is coming in today for the above mentioned reasons. Past Medical History is significant for: Hypertension, hyperlipidemia, type 2 diabetes, obesity, history of CAD.  For the past 3 weeks or so he has been experiencing right sided chest pain with moving his torso.  He does not recall any injury to his chest wall, no heavy lifting.  It does not hurt with inspiration, no change with exercise, no associated symptoms such as dyspnea, palpitations, lightheadedness, headache.  It also hurts when he presses deeply on that side of his chest.   Past Medical/Surgical History: Past Medical History:  Diagnosis Date   Allergy    BPV (benign positional vertigo) 10/2009   CAD S/P percutaneous coronary angioplasty    PCI to LAD, the DES   Cardiomyopathy, ischemic - EF ~40-45% with distal Anterior - Antero& InferoApical hypokinesis. 11/29/2012   F/U Echo 02/2013: EF 50-55%. Mild anterior apical and apical lateral HK, Gr 1 DD   Diabetes mellitus    HISTORY OF: ST elevation myocardial infarction (STEMI) of inferior wall 11/29/2012    99% LAD occlusion - PCI with DES   Hyperlipidemia    Hypertension, essential    Low back pain    Presence of drug coated stent in LAD coronary artery 11/29/2012   Xience eXpedition DES 3.0 mm x 18 mm - (3.4 mm)    Past Surgical History:  Procedure Laterality Date   LEFT HEART CATHETERIZATION WITH CORONARY ANGIOGRAM N/A 11/29/2012   Procedure: LEFT HEART CATHETERIZATION WITH CORONARY ANGIOGRAM;  Surgeon: Leonie Man, MD;  Location: [facility] CATH LAB;  Service: Cardiovascular;  Laterality: N/A;   NECK SURGERY     resection benign tumor   PERCUTANEOUS CORONARY STENT INTERVENTION (PCI-S) N/A 11/29/2012   Procedure: PERCUTANEOUS CORONARY STENT INTERVENTION (PCI-S);  Surgeon: Leonie Man, MD;  Location: [facility] CATH LAB;;; 99% thrombotic LAD occlusion - Xience Expedition  3.0 mm x 18 mm - post-dilated to 3.4 mm   TRANSTHORACIC ECHOCARDIOGRAM  03/12/2013   Post MI: Low normal EF (50-55%), apical false tendon.  Mild HK of apical anterior and apical lateral wall.  Grade 1 diastolic dysfunction.    Social History:  reports that he has never smoked. He has never used smokeless tobacco. He reports that he does not currently use drugs. He reports that he does not drink alcohol.  Allergies: Allergies  Allergen Reactions   Hydromet [Hydrocodone Bit-Homatrop Mbr]     Lip swelling with concomitant use with tramadol   Iodine     REACTION: unspecified   Ultram [Tramadol]     Lip swelling with concomitant use of Hytromet    Family History:  Family History  Problem Relation Age of Onset   Healthy Other        Noncontributory; he is not very aware of his family's history.     Current Outpatient Medications:    atorvastatin (LIPITOR) 80 MG tablet, TAKE 1 TABLET BY MOUTH EVERY DAY, Disp: 90 tablet, Rfl: 1   carvedilol (COREG) 12.5 MG tablet, TAKE 1 TABLET BY MOUTH TWICE A DAY WITH MEALS, Disp: 180 tablet, Rfl: 1   clopidogrel (PLAVIX) 75 MG tablet, TAKE 1 TABLET BY MOUTH EVERY DAY, Disp: 90 tablet, Rfl: 1   ezetimibe (ZETIA) 10 MG tablet, TAKE 1 TABLET BY MOUTH EVERY DAY, Disp: 90 tablet, Rfl: 1   glucose blood test strip,  Check glucose level before each meal ONE TOUCH VERIO TEST STRIP, Disp: 100 each, Rfl: 12   lisinopril (ZESTRIL) 10 MG tablet, TAKE 1 TABLET BY MOUTH EVERY DAY, Disp: 90 tablet, Rfl: 1   metFORMIN (GLUCOPHAGE) 1000 MG tablet, TAKE 1 TABLET BY MOUTH TWICE A DAY WITH MEALS, Disp: 180 tablet, Rfl: 1  Current Facility-Administered Medications:    dextrose 5 % solution, , Intravenous, Once, Sharyn Creamer, MD  Review of Systems:  Negative unless indicated in HPI.   Physical Exam: Vitals:   06/29/22 1449  BP: 124/80  Pulse: 87  Temp: 97.9 F (36.6 C)  TempSrc: Oral  SpO2: 98%  Weight: 181 lb 8 oz (82.3 kg)    Body mass index is 28.15  kg/m.   Physical Exam Vitals reviewed.  Constitutional:      Appearance: Normal appearance.  HENT:     Head: Normocephalic and atraumatic.  Eyes:     Conjunctiva/sclera: Conjunctivae normal.     Pupils: Pupils are equal, round, and reactive to light.  Cardiovascular:     Rate and Rhythm: Normal rate and regular rhythm.  Pulmonary:     Effort: Pulmonary effort is normal.     Breath sounds: Normal breath sounds.  Skin:    General: Skin is warm and dry.  Neurological:     General: No focal deficit present.     Mental Status: He is alert and oriented to person, place, and time.  Psychiatric:        Mood and Affect: Mood normal.        Behavior: Behavior normal.        Thought Content: Thought content normal.        Judgment: Judgment normal.     Impression and Plan:  Chest pain, unspecified type - Plan: EKG 12-Lead  -Chest pain is very atypical for cardiac disease.  Likely is musculoskeletal in origin, especially since worsens with movement and deep palpation.  It is not pleuritic. -Because of his multiple CAD risk factors and history of CAD, EKG was done in office today and interpreted by myself as a sinus rhythm at a rate of 81, left axis deviation, no acute ischemic changes. -Do not feel urgent cardiac workup is necessary, he will take as needed NSAIDs/pain relievers and notify if does not resolve over the next few weeks.  Time spent:33 minutes reviewing chart, interviewing and examining patient and formulating plan of care.     Lelon Frohlich, MD [facility] at [facility]

## 2022-07-29 ENCOUNTER — Other Ambulatory Visit: Payer: Self-pay | Admitting: Internal Medicine

## 2022-08-02 ENCOUNTER — Other Ambulatory Visit: Payer: Self-pay | Admitting: Internal Medicine

## 2022-08-02 DIAGNOSIS — E118 Type 2 diabetes mellitus with unspecified complications: Secondary | ICD-10-CM

## 2022-08-30 ENCOUNTER — Ambulatory Visit (INDEPENDENT_AMBULATORY_CARE_PROVIDER_SITE_OTHER): Payer: BC Managed Care – PPO | Admitting: Internal Medicine

## 2022-08-30 ENCOUNTER — Encounter: Payer: Self-pay | Admitting: Internal Medicine

## 2022-08-30 VITALS — BP 128/71 | HR 85 | Temp 97.7°F | Wt 181.0 lb

## 2022-08-30 DIAGNOSIS — I10 Essential (primary) hypertension: Secondary | ICD-10-CM

## 2022-08-30 DIAGNOSIS — E1151 Type 2 diabetes mellitus with diabetic peripheral angiopathy without gangrene: Secondary | ICD-10-CM | POA: Diagnosis not present

## 2022-08-30 DIAGNOSIS — I2510 Atherosclerotic heart disease of native coronary artery without angina pectoris: Secondary | ICD-10-CM

## 2022-08-30 DIAGNOSIS — E1169 Type 2 diabetes mellitus with other specified complication: Secondary | ICD-10-CM

## 2022-08-30 DIAGNOSIS — Z9861 Coronary angioplasty status: Secondary | ICD-10-CM

## 2022-08-30 DIAGNOSIS — E785 Hyperlipidemia, unspecified: Secondary | ICD-10-CM

## 2022-08-30 LAB — POCT GLYCOSYLATED HEMOGLOBIN (HGB A1C): Hemoglobin A1C: 6.9 % — AB (ref 4.0–5.6)

## 2022-08-30 NOTE — Progress Notes (Signed)
Established Patient Office Visit     CC/Reason for Visit: 33-month follow-up chronic conditions  HPI: Matthew Prince is a 67 y.o. male who is coming in today for the above mentioned reasons. Past Medical History is significant for: Hypertension, hyperlipidemia, type 2 diabetes, obesity, history of coronary artery disease.  He is feeling well and has no acute concerns or complaints.   Past Medical/Surgical History: Past Medical History:  Diagnosis Date   Allergy    BPV (benign positional vertigo) 10/2009   CAD S/P percutaneous coronary angioplasty    PCI to LAD, the DES   Cardiomyopathy, ischemic - EF ~40-45% with distal Anterior - Antero& InferoApical hypokinesis. 11/29/2012   F/U Echo 02/2013: EF 50-55%. Mild anterior apical and apical lateral HK, Gr 1 DD   Diabetes mellitus    HISTORY OF: ST elevation myocardial infarction (STEMI) of inferior wall 11/29/2012    99% LAD occlusion - PCI with DES   Hyperlipidemia    Hypertension, essential    Low back pain    Presence of drug coated stent in LAD coronary artery 11/29/2012   Xience eXpedition DES 3.0 mm x 18 mm - (3.4 mm)    Past Surgical History:  Procedure Laterality Date   LEFT HEART CATHETERIZATION WITH CORONARY ANGIOGRAM N/A 11/29/2012   Procedure: LEFT HEART CATHETERIZATION WITH CORONARY ANGIOGRAM;  Surgeon: Leonie Man, MD;  Location[facility] C[facility] LAB;  Service: Cardiovascular;  Laterality: N/A;   NECK SURGERY     resection benign tumor   PERCUTANEOUS CORONARY STENT INTERVENTION (PCI-S) N/A 11/29/2012   Procedure: PERCUTANEOUS CORONARY STENT INTERVENTION (PCI-S);  Surgeon: Leonie Man, MD;  Location: [facility] CATH LAB;;; 99% thrombotic LAD occlusion - Xience Expedition 3.0 mm x 18 mm - post-dilated to 3.4 mm   TRANSTHORACIC ECHOCARDIOGRAM  03/12/2013   Post MI: Low normal EF (50-55%), apical false tendon.  Mild HK of apical anterior and apical lateral wall.  Grade 1 diastolic dysfunction.    Social History:  reports that he has  never smoked. He has never used smokeless tobacco. He reports that he does not currently use drugs. He reports that he does not drink alcohol.  Allergies: Allergies  Allergen Reactions   Hydromet [Hydrocodone Bit-Homatrop Mbr]     Lip swelling with concomitant use with tramadol   Iodine     REACTION: unspecified   Ultram [Tramadol]     Lip swelling with concomitant use of Hytromet    Family History:  Family History  Problem Relation Age of Onset   Healthy Other        Noncontributory; he is not very aware of his family's history.     Current Outpatient Medications:    atorvastatin (LIPITOR) 80 MG tablet, TAKE 1 TABLET BY MOUTH EVERY DAY, Disp: 90 tablet, Rfl: 1   carvedilol (COREG) 12.5 MG tablet, TAKE 1 TABLET BY MOUTH TWICE A DAY WITH MEALS, Disp: 180 tablet, Rfl: 1   clopidogrel (PLAVIX) 75 MG tablet, TAKE 1 TABLET BY MOUTH EVERY DAY, Disp: 90 tablet, Rfl: 1   Dulaglutide (TRULICITY) 3 0000000 SOPN, INJECT 3 MG AS DIRECTED ONCE A WEEK., Disp: 2 mL, Rfl: 2   ezetimibe (ZETIA) 10 MG tablet, TAKE 1 TABLET BY MOUTH EVERY DAY, Disp: 90 tablet, Rfl: 1   glucose blood test strip, Check glucose level before each meal ONE TOUCH VERIO TEST STRIP, Disp: 100 each, Rfl: 12   lisinopril (ZESTRIL) 10 MG tablet, TAKE 1 TABLET BY MOUTH EVERY DAY, Disp: 90  tablet, Rfl: 1   metFORMIN (GLUCOPHAGE) 1000 MG tablet, TAKE 1 TABLET BY MOUTH TWICE A DAY WITH MEALS, Disp: 180 tablet, Rfl: 1  Current Facility-Administered Medications:    dextrose 5 % solution, , Intravenous, Once, Sharyn Creamer, MD  Review of Systems:  Negative unless indicated in HPI.   Physical Exam: Vitals:   08/30/22 1511  BP: 128/71  Pulse: 85  Temp: 97.7 F (36.5 C)  TempSrc: Oral  SpO2: 96%  Weight: 181 lb (82.1 kg)    Body mass index is 28.08 kg/m.   Physical Exam Vitals reviewed.  Constitutional:      Appearance: Normal appearance.  HENT:     Head: Normocephalic and atraumatic.  Eyes:      Conjunctiva/sclera: Conjunctivae normal.     Pupils: Pupils are equal, round, and reactive to light.  Cardiovascular:     Rate and Rhythm: Normal rate and regular rhythm.  Pulmonary:     Effort: Pulmonary effort is normal.     Breath sounds: Normal breath sounds.  Skin:    General: Skin is warm and dry.  Neurological:     General: No focal deficit present.     Mental Status: He is alert and oriented to person, place, and time.  Psychiatric:        Mood and Affect: Mood normal.        Behavior: Behavior normal.        Thought Content: Thought content normal.        Judgment: Judgment normal.      Impression and Plan:  DM (diabetes mellitus), type 2 with peripheral vascular complications - Plan: POC HgB A1c  Hyperlipidemia associated with type 2 diabetes mellitus  Hypertension, essential  CAD (coronary artery disease), native coronary artery - (STEMI) LAD 99% Thrombosis - PCI with Xience eXpedition 3.0 mm x `18 mm (3.4 mm post)  -A1c of 6.9 demonstrates excellent diabetic control. -Blood pressure is well-controlled. -LDL is optimal at 43 as of October/2023.  Time spent:31 minutes reviewing chart, interviewing and examining patient and formulating plan of care.     Lelon Frohlich, MD [facility] at [facility]

## 2022-10-26 ENCOUNTER — Other Ambulatory Visit: Payer: Self-pay | Admitting: Internal Medicine

## 2022-10-26 DIAGNOSIS — E118 Type 2 diabetes mellitus with unspecified complications: Secondary | ICD-10-CM

## 2022-10-26 DIAGNOSIS — Z0000 Encounter for general adult medical examination without abnormal findings: Secondary | ICD-10-CM

## 2022-11-10 ENCOUNTER — Other Ambulatory Visit: Payer: Self-pay | Admitting: Internal Medicine

## 2022-11-10 DIAGNOSIS — E118 Type 2 diabetes mellitus with unspecified complications: Secondary | ICD-10-CM

## 2022-11-23 ENCOUNTER — Other Ambulatory Visit (HOSPITAL_COMMUNITY): Payer: Self-pay

## 2022-11-23 ENCOUNTER — Other Ambulatory Visit: Payer: Self-pay

## 2022-11-23 ENCOUNTER — Telehealth: Payer: Self-pay | Admitting: Internal Medicine

## 2022-11-23 DIAGNOSIS — E118 Type 2 diabetes mellitus with unspecified complications: Secondary | ICD-10-CM

## 2022-11-23 MED ORDER — TRULICITY 3 MG/0.5ML ~~LOC~~ SOAJ
3.0000 mg | SUBCUTANEOUS | 1 refills | Status: DC
Start: 2022-11-23 — End: 2022-11-27
  Filled 2022-11-23: qty 2, 28d supply, fill #0

## 2022-11-23 NOTE — Telephone Encounter (Signed)
Pt is calling and Dulaglutide (TRULICITY) 3 MG/0.5ML SOPN is not available. Pt would like a callback

## 2022-11-23 NOTE — Telephone Encounter (Signed)
A refill has been sent to [facility].  Left message on machine for patient to return our call.

## 2022-11-24 ENCOUNTER — Other Ambulatory Visit (HOSPITAL_COMMUNITY): Payer: Self-pay

## 2022-11-24 NOTE — Telephone Encounter (Signed)
Pt made aware of prescription being sent to [facility] pharmacy.

## 2022-11-24 NOTE — Telephone Encounter (Signed)
Pt says this prescription Dulaglutide (TRULICITY) 3 MG/0.5ML SOPN is too expensive at [facility] and requesting it be redirected to   [id number] - [place], [place] - [address] AT [facility] [address] [facility] [facility] [address] Phone: [phone]  Fax: [phone]

## 2022-11-27 ENCOUNTER — Other Ambulatory Visit: Payer: Self-pay

## 2022-11-27 ENCOUNTER — Other Ambulatory Visit (HOSPITAL_COMMUNITY): Payer: Self-pay

## 2022-11-27 MED ORDER — TRULICITY 3 MG/0.5ML ~~LOC~~ SOAJ
3.0000 mg | SUBCUTANEOUS | 1 refills | Status: DC
Start: 2022-11-27 — End: 2023-09-06

## 2022-11-27 NOTE — Addendum Note (Signed)
Addended by: Johnella Moloney on: 11/27/2022 09:09 AM   Modules accepted: Orders

## 2022-11-27 NOTE — Telephone Encounter (Signed)
Rx done. 

## 2022-11-29 ENCOUNTER — Ambulatory Visit: Payer: BC Managed Care – PPO | Admitting: Internal Medicine

## 2022-11-29 ENCOUNTER — Encounter: Payer: Self-pay | Admitting: Internal Medicine

## 2022-11-29 VITALS — BP 120/80 | HR 70 | Temp 98.0°F | Ht 67.5 in | Wt 178.0 lb

## 2022-11-29 DIAGNOSIS — R809 Proteinuria, unspecified: Secondary | ICD-10-CM

## 2022-11-29 DIAGNOSIS — E1169 Type 2 diabetes mellitus with other specified complication: Secondary | ICD-10-CM | POA: Diagnosis not present

## 2022-11-29 DIAGNOSIS — I10 Essential (primary) hypertension: Secondary | ICD-10-CM | POA: Diagnosis not present

## 2022-11-29 DIAGNOSIS — Z7984 Long term (current) use of oral hypoglycemic drugs: Secondary | ICD-10-CM

## 2022-11-29 DIAGNOSIS — E1151 Type 2 diabetes mellitus with diabetic peripheral angiopathy without gangrene: Secondary | ICD-10-CM

## 2022-11-29 DIAGNOSIS — E1129 Type 2 diabetes mellitus with other diabetic kidney complication: Secondary | ICD-10-CM

## 2022-11-29 DIAGNOSIS — E785 Hyperlipidemia, unspecified: Secondary | ICD-10-CM

## 2022-11-29 LAB — POCT GLYCOSYLATED HEMOGLOBIN (HGB A1C): Hemoglobin A1C: 6.7 % — AB (ref 4.0–5.6)

## 2022-11-29 NOTE — Assessment & Plan Note (Signed)
Well controlled on current

## 2022-11-29 NOTE — Patient Instructions (Signed)
Health Maintenance Due  Topic Date Due   HIV Screening  Never done   COVID-19 Vaccine (4 - 2023-24 season) 01/27/2022      Row Labels 06/29/2022    4:17 PM 05/31/2022    3:14 PM 02/28/2022    8:59 AM  Depression screen PHQ 2/9   Section Header. No data exists in this row.     Decreased Interest   0 0 0  Down, Depressed, Hopeless   0 0 0  PHQ - 2 Score   0 0 0  Altered sleeping   0 0 0  Tired, decreased energy   0 0 1  Change in appetite   0 0 0  Feeling bad or failure about yourself    0 0 0  Trouble concentrating   0 0   Moving slowly or fidgety/restless   0 0   Suicidal thoughts   0 0   PHQ-9 Score   0 0 1  Difficult doing work/chores   Not difficult at all Not difficult at all

## 2022-11-29 NOTE — Assessment & Plan Note (Signed)
Well-controlled on atorvastatin 80 mg and ezetimibe 10 mg with an LDL of 43.

## 2022-11-29 NOTE — Assessment & Plan Note (Signed)
On lisinopril

## 2022-11-29 NOTE — Progress Notes (Signed)
Established Patient Office Visit     CC/Reason for Visit: Follow-up chronic medical conditions  HPI: Matthew Prince is a 67 y.o. male who is coming in today for the above mentioned reasons. Past Medical History is significant for: Hypertension, hyperlipidemia, type 2 diabetes, coronary artery disease, obesity.  He has been feeling well.  He has been unable to find Trulicity at any pharmacy for the past 3 weeks.  He has been compliant with all of her medication.   Past Medical/Surgical History: Past Medical History:  Diagnosis Date   Allergy    BPV (benign positional vertigo) 10/2009   CAD S/P percutaneous coronary angioplasty    PCI to LAD, the DES   Cardiomyopathy, ischemic - EF ~40-45% with distal Anterior - Antero& InferoApical hypokinesis. 11/29/2012   F/U Echo 02/2013: EF 50-55%. Mild anterior apical and apical lateral HK, Gr 1 DD   Diabetes mellitus    HISTORY OF: ST elevation myocardial infarction (STEMI) of inferior wall 11/29/2012    99% LAD occlusion - PCI with DES   Hyperlipidemia    Hypertension, essential    Low back pain    Presence of drug coated stent in LAD coronary artery 11/29/2012   Xience eXpedition DES 3.0 mm x 18 mm - (3.4 mm)    Past Surgical History:  Procedure Laterality Date   LEFT HEART CATHETERIZATION WITH CORONARY ANGIOGRAM N/A 11/29/2012   Procedure: LEFT HEART CATHETERIZATION WITH CORONARY ANGIOGRAM;  Surgeon: Marykay Lex, MD;  Location: [facility] CATH LAB;  Service: Cardiovascular;  Laterality: N/A;   NECK SURGERY     resection benign tumor   PERCUTANEOUS CORONARY STENT INTERVENTION (PCI-S) N/A 11/29/2012   Procedure: PERCUTANEOUS CORONARY STENT INTERVENTION (PCI-S);  Surgeon: Marykay Lex, MD;  Location: [facility] CATH LAB;;; 99% thrombotic LAD occlusion - Xience Expedition 3.0 mm x 18 mm - post-dilated to 3.4 mm   TRANSTHORACIC ECHOCARDIOGRAM  03/12/2013   Post MI: Low normal EF (50-55%), apical false tendon.  Mild HK of apical anterior and apical lateral  wall.  Grade 1 diastolic dysfunction.    Social History:  reports that he has never smoked. He has never used smokeless tobacco. He reports that he does not currently use drugs. He reports that he does not drink alcohol.  Allergies: Allergies  Allergen Reactions   Hydromet [Hydrocodone Bit-Homatrop Mbr]     Lip swelling with concomitant use with tramadol   Iodine     REACTION: unspecified   Ultram [Tramadol]     Lip swelling with concomitant use of Hytromet    Family History:  Family History  Problem Relation Age of Onset   Healthy Other        Noncontributory; he is not very aware of his family's history.     Current Outpatient Medications:    atorvastatin (LIPITOR) 80 MG tablet, TAKE 1 TABLET BY MOUTH EVERY DAY, Disp: 90 tablet, Rfl: 1   carvedilol (COREG) 12.5 MG tablet, TAKE 1 TABLET BY MOUTH TWICE A DAY WITH FOOD, Disp: 180 tablet, Rfl: 1   clopidogrel (PLAVIX) 75 MG tablet, TAKE 1 TABLET BY MOUTH EVERY DAY, Disp: 90 tablet, Rfl: 1   Dulaglutide (TRULICITY) 3 MG/0.5ML SOPN, Inject 3 mg into the skin once a week as directed., Disp: 6 mL, Rfl: 1   ezetimibe (ZETIA) 10 MG tablet, TAKE 1 TABLET BY MOUTH EVERY DAY, Disp: 90 tablet, Rfl: 1   glucose blood test strip, Check glucose level before each meal ONE TOUCH VERIO TEST STRIP,  Disp: 100 each, Rfl: 12   lisinopril (ZESTRIL) 10 MG tablet, TAKE 1 TABLET BY MOUTH EVERY DAY, Disp: 90 tablet, Rfl: 1   metFORMIN (GLUCOPHAGE) 1000 MG tablet, TAKE 1 TABLET BY MOUTH TWICE A DAY WITH FOOD, Disp: 180 tablet, Rfl: 1  Current Facility-Administered Medications:    dextrose 5 % solution, , Intravenous, Once, Imogene Burn, MD  Review of Systems:  Negative unless indicated in HPI.   Physical Exam: Vitals:   11/29/22 1505  BP: 120/80  Pulse: 70  Temp: 98 F (36.7 C)  TempSrc: Oral  SpO2: 97%  Weight: 178 lb (80.7 kg)  Height: 5' 7.5" (1.715 m)    Body mass index is 27.47 kg/m.   Physical Exam Vitals reviewed.   Constitutional:      Appearance: Normal appearance.  HENT:     Head: Normocephalic and atraumatic.  Eyes:     Conjunctiva/sclera: Conjunctivae normal.     Pupils: Pupils are equal, round, and reactive to light.  Cardiovascular:     Rate and Rhythm: Normal rate and regular rhythm.  Pulmonary:     Effort: Pulmonary effort is normal.     Breath sounds: Normal breath sounds.  Skin:    General: Skin is warm and dry.  Neurological:     General: No focal deficit present.     Mental Status: He is alert and oriented to person, place, and time.  Psychiatric:        Mood and Affect: Mood normal.        Behavior: Behavior normal.        Thought Content: Thought content normal.        Judgment: Judgment normal.      Impression and Plan:  DM (diabetes mellitus), type 2 with peripheral vascular complications (HCC) Assessment & Plan: A1c remains well-controlled at 6.7.  Has been off Trulicity for 3 months due to supply chain issues.  He elects to stay off GLP-1 and on metformin only.  If A1c increases at next check we can discuss further medication management.  Orders: -     POCT glycosylated hemoglobin (Hb A1C)  Hyperlipidemia associated with type 2 diabetes mellitus (HCC) Assessment & Plan: Well-controlled on atorvastatin 80 mg and ezetimibe 10 mg with an LDL of 43.   Hypertension, essential Assessment & Plan: Well-controlled on current.   Microalbuminuria due to type 2 diabetes mellitus [facility]) Assessment & Plan: On lisinopril.      Time spent:31 minutes reviewing chart, interviewing and examining patient and formulating plan of care.     Chaya Jan, MD [place] [facility]

## 2022-11-29 NOTE — Assessment & Plan Note (Signed)
A1c remains well-controlled at 6.7.  Has been off Trulicity for 3 months due to supply chain issues.  He elects to stay off GLP-1 and on metformin only.  If A1c increases at next check we can discuss further medication management.

## 2022-12-10 ENCOUNTER — Other Ambulatory Visit: Payer: Self-pay | Admitting: Internal Medicine

## 2022-12-10 DIAGNOSIS — E1169 Type 2 diabetes mellitus with other specified complication: Secondary | ICD-10-CM

## 2022-12-22 ENCOUNTER — Other Ambulatory Visit: Payer: Self-pay | Admitting: Internal Medicine

## 2022-12-25 NOTE — Telephone Encounter (Signed)
Left message on machine for patient to return our call 

## 2022-12-25 NOTE — Telephone Encounter (Signed)
Not on current med list.  Okay to refill? 

## 2022-12-25 NOTE — Telephone Encounter (Signed)
Patient states that he has been on it a while.

## 2023-02-16 ENCOUNTER — Other Ambulatory Visit: Payer: Self-pay | Admitting: Internal Medicine

## 2023-02-16 DIAGNOSIS — E118 Type 2 diabetes mellitus with unspecified complications: Secondary | ICD-10-CM

## 2023-02-16 DIAGNOSIS — Z0000 Encounter for general adult medical examination without abnormal findings: Secondary | ICD-10-CM

## 2023-03-15 ENCOUNTER — Ambulatory Visit: Payer: BC Managed Care – PPO | Admitting: Internal Medicine

## 2023-03-15 ENCOUNTER — Encounter: Payer: Self-pay | Admitting: Internal Medicine

## 2023-03-15 VITALS — BP 120/70 | HR 76 | Temp 97.5°F | Ht 67.5 in | Wt 177.7 lb

## 2023-03-15 DIAGNOSIS — Z7984 Long term (current) use of oral hypoglycemic drugs: Secondary | ICD-10-CM

## 2023-03-15 DIAGNOSIS — I2510 Atherosclerotic heart disease of native coronary artery without angina pectoris: Secondary | ICD-10-CM

## 2023-03-15 DIAGNOSIS — Z0000 Encounter for general adult medical examination without abnormal findings: Secondary | ICD-10-CM

## 2023-03-15 DIAGNOSIS — E559 Vitamin D deficiency, unspecified: Secondary | ICD-10-CM

## 2023-03-15 DIAGNOSIS — E1129 Type 2 diabetes mellitus with other diabetic kidney complication: Secondary | ICD-10-CM

## 2023-03-15 DIAGNOSIS — Z9861 Coronary angioplasty status: Secondary | ICD-10-CM

## 2023-03-15 DIAGNOSIS — E1151 Type 2 diabetes mellitus with diabetic peripheral angiopathy without gangrene: Secondary | ICD-10-CM

## 2023-03-15 DIAGNOSIS — Z23 Encounter for immunization: Secondary | ICD-10-CM | POA: Diagnosis not present

## 2023-03-15 DIAGNOSIS — Z114 Encounter for screening for human immunodeficiency virus [HIV]: Secondary | ICD-10-CM

## 2023-03-15 DIAGNOSIS — E1169 Type 2 diabetes mellitus with other specified complication: Secondary | ICD-10-CM

## 2023-03-15 DIAGNOSIS — I10 Essential (primary) hypertension: Secondary | ICD-10-CM

## 2023-03-15 DIAGNOSIS — E785 Hyperlipidemia, unspecified: Secondary | ICD-10-CM

## 2023-03-15 DIAGNOSIS — R809 Proteinuria, unspecified: Secondary | ICD-10-CM

## 2023-03-15 NOTE — Addendum Note (Signed)
Addended by: Kern Reap B on: 03/15/2023 04:09 PM   Modules accepted: Orders

## 2023-03-15 NOTE — Progress Notes (Signed)
Established Patient Office Visit     CC/Reason for Visit: Annual preventive exam  HPI: Matthew Prince is a 67 y.o. male who is coming in today for the above mentioned reasons. Past Medical History is significant for: Hypertension, hyperlipidemia, type 2 diabetes, coronary artery disease, obesity.  Feeling well without major concerns or complaints.  Has routine eye and dental care.  Due for flu and COVID vaccines.  Had a colonoscopy in 2022.   Past Medical/Surgical History: Past Medical History:  Diagnosis Date   Allergy    BPV (benign positional vertigo) 10/2009   CAD S/P percutaneous coronary angioplasty    PCI to LAD, the DES   Cardiomyopathy, ischemic - EF ~40-45% with distal Anterior - Antero& InferoApical hypokinesis. 11/29/2012   F/U Echo 02/2013: EF 50-55%. Mild anterior apical and apical lateral HK, Gr 1 DD   Diabetes mellitus    HISTORY OF: ST elevation myocardial infarction (STEMI) of inferior wall 11/29/2012    99% LAD occlusion - PCI with DES   Hyperlipidemia    Hypertension, essential    Low back pain    Presence of drug coated stent in LAD coronary artery 11/29/2012   Xience eXpedition DES 3.0 mm x 18 mm - (3.4 mm)    Past Surgical History:  Procedure Laterality Date   LEFT HEART CATHETERIZATION WITH CORONARY ANGIOGRAM N/A 11/29/2012   Procedure: LEFT HEART CATHETERIZATION WITH CORONARY ANGIOGRAM;  Surgeon: Marykay Lex, MD;  Location: [facility] CATH LAB;  Service: Cardiovascular;  Laterality: N/A;   NECK SURGERY     resection benign tumor   PERCUTANEOUS CORONARY STENT INTERVENTION (PCI-S) N/A 11/29/2012   Procedure: PERCUTANEOUS CORONARY STENT INTERVENTION (PCI-S);  Surgeon: Marykay Lex, MD;  Location: [facility] CATH LAB;;; 99% thrombotic LAD occlusion - Xience Expedition 3.0 mm x 18 mm - post-dilated to 3.4 mm   TRANSTHORACIC ECHOCARDIOGRAM  03/12/2013   Post MI: Low normal EF (50-55%), apical false tendon.  Mild HK of apical anterior and apical lateral wall.  Grade 1  diastolic dysfunction.    Social History:  reports that he has never smoked. He has never used smokeless tobacco. He reports that he does not currently use drugs. He reports that he does not drink alcohol.  Allergies: Allergies  Allergen Reactions   Hydromet [Hydrocodone Bit-Homatrop Mbr]     Lip swelling with concomitant use with tramadol   Iodine     REACTION: unspecified   Ultram [Tramadol]     Lip swelling with concomitant use of Hytromet    Family History:  Family History  Problem Relation Age of Onset   Healthy Other        Noncontributory; he is not very aware of his family's history.     Current Outpatient Medications:    atorvastatin (LIPITOR) 80 MG tablet, TAKE 1 TABLET BY MOUTH EVERY DAY, Disp: 90 tablet, Rfl: 1   carvedilol (COREG) 12.5 MG tablet, TAKE 1 TABLET BY MOUTH TWICE A DAY WITH FOOD, Disp: 180 tablet, Rfl: 1   clopidogrel (PLAVIX) 75 MG tablet, TAKE 1 TABLET BY MOUTH EVERY DAY, Disp: 90 tablet, Rfl: 1   Dulaglutide (TRULICITY) 3 MG/0.5ML SOPN, Inject 3 mg into the skin once a week as directed., Disp: 6 mL, Rfl: 1   ezetimibe (ZETIA) 10 MG tablet, TAKE 1 TABLET BY MOUTH EVERY DAY, Disp: 90 tablet, Rfl: 1   glipiZIDE (GLUCOTROL XL) 10 MG 24 hr tablet, TAKE 1 TABLET BY MOUTH EVERY DAY WITH BREAKFAST, Disp: 90 tablet,  Rfl: 1   glucose blood test strip, Check glucose level before each meal ONE TOUCH VERIO TEST STRIP, Disp: 100 each, Rfl: 12   lisinopril (ZESTRIL) 10 MG tablet, TAKE 1 TABLET BY MOUTH EVERY DAY, Disp: 90 tablet, Rfl: 1   metFORMIN (GLUCOPHAGE) 1000 MG tablet, TAKE 1 TABLET BY MOUTH TWICE A DAY WITH FOOD, Disp: 180 tablet, Rfl: 1  Current Facility-Administered Medications:    dextrose 5 % solution, , Intravenous, Once, Imogene Burn, MD  Review of Systems:  Negative unless indicated in HPI.   Physical Exam: Vitals:   03/15/23 1529  BP: 120/70  Pulse: 76  Temp: (!) 97.5 F (36.4 C)  TempSrc: Oral  SpO2: 98%  Weight: 177 lb 11.2 oz  (80.6 kg)  Height: 5' 7.5" (1.715 m)    Body mass index is 27.42 kg/m.   Physical Exam Vitals reviewed.  Constitutional:      General: He is not in acute distress.    Appearance: Normal appearance. He is not ill-appearing, toxic-appearing or diaphoretic.  HENT:     Head: Normocephalic.     Right Ear: Tympanic membrane, ear canal and external ear normal. There is no impacted cerumen.     Left Ear: Tympanic membrane, ear canal and external ear normal. There is no impacted cerumen.     Nose: Nose normal.     Mouth/Throat:     Mouth: Mucous membranes are moist.     Pharynx: Oropharynx is clear. No oropharyngeal exudate or posterior oropharyngeal erythema.  Eyes:     General: No scleral icterus.       Right eye: No discharge.        Left eye: No discharge.     Conjunctiva/sclera: Conjunctivae normal.     Pupils: Pupils are equal, round, and reactive to light.  Neck:     Vascular: No carotid bruit.  Cardiovascular:     Rate and Rhythm: Normal rate and regular rhythm.     Pulses: Normal pulses.     Heart sounds: Normal heart sounds.  Pulmonary:     Effort: Pulmonary effort is normal. No respiratory distress.     Breath sounds: Normal breath sounds.  Abdominal:     General: Abdomen is flat. Bowel sounds are normal.     Palpations: Abdomen is soft.  Musculoskeletal:        General: Normal range of motion.     Cervical back: Normal range of motion.  Skin:    General: Skin is warm and dry.  Neurological:     General: No focal deficit present.     Mental Status: He is alert and oriented to person, place, and time. Mental status is at baseline.  Psychiatric:        Mood and Affect: Mood normal.        Behavior: Behavior normal.        Thought Content: Thought content normal.        Judgment: Judgment normal.     Flowsheet Row Office Visit from 03/15/2023 in [facility] [place]  PHQ-9 Total Score 0        Impression and Plan:  Encounter for  preventive health examination -     PSA; Future  DM (diabetes mellitus), type 2 with peripheral vascular complications (HCC) -     CBC with Differential/Platelet; Future -     Comprehensive metabolic panel; Future -     Hemoglobin A1c; Future -     Microalbumin / creatinine  urine ratio; Future  Hyperlipidemia associated with type 2 diabetes mellitus (HCC) -     Lipid panel; Future  Hypertension, essential  Microalbuminuria due to type 2 diabetes mellitus (HCC)  Immunization due  Encounter for screening for HIV -     HIV Antibody (routine testing w rflx); Future  Vitamin D deficiency -     VITAMIN D 25 Hydroxy (Vit-D Deficiency, Fractures); Future  CAD (coronary artery disease), native coronary artery - (STEMI) LAD 99% Thrombosis - PCI with Xience eXpedition 3.0 mm x `18 mm (3.4 mm post)   -Recommend routine eye and dental care. -Healthy lifestyle discussed in detail. -Labs to be updated today. -Prostate cancer screening: PSA today Health Maintenance  Topic Date Due   HIV Screening  Never done   Flu Shot  12/28/2022   COVID-19 Vaccine (4 - 2023-24 season) 01/28/2023   Yearly kidney function blood test for diabetes  03/01/2023   Yearly kidney health urinalysis for diabetes  03/01/2023   Eye exam for diabetics  04/19/2023   Hemoglobin A1C  06/01/2023   Complete foot exam   03/14/2024   Pneumonia Vaccine (3 of 3 - PPSV23 or PCV20) 10/13/2025   Colon Cancer Screening  05/21/2031   DTaP/Tdap/Td vaccine (4 - Td or Tdap) 02/29/2032   Hepatitis C Screening  Completed   Zoster (Shingles) Vaccine  Completed   HPV Vaccine  Aged Out      -Flu vaccine in office today.     Chaya Jan, MD [place] [facility] at [facility]

## 2023-03-18 ENCOUNTER — Other Ambulatory Visit: Payer: Self-pay | Admitting: Internal Medicine

## 2023-03-23 ENCOUNTER — Other Ambulatory Visit (INDEPENDENT_AMBULATORY_CARE_PROVIDER_SITE_OTHER): Payer: BC Managed Care – PPO

## 2023-03-23 DIAGNOSIS — Z114 Encounter for screening for human immunodeficiency virus [HIV]: Secondary | ICD-10-CM

## 2023-03-23 DIAGNOSIS — Z0000 Encounter for general adult medical examination without abnormal findings: Secondary | ICD-10-CM

## 2023-03-23 DIAGNOSIS — E559 Vitamin D deficiency, unspecified: Secondary | ICD-10-CM

## 2023-03-23 DIAGNOSIS — E1151 Type 2 diabetes mellitus with diabetic peripheral angiopathy without gangrene: Secondary | ICD-10-CM | POA: Diagnosis not present

## 2023-03-23 DIAGNOSIS — E785 Hyperlipidemia, unspecified: Secondary | ICD-10-CM | POA: Diagnosis not present

## 2023-03-23 DIAGNOSIS — E1169 Type 2 diabetes mellitus with other specified complication: Secondary | ICD-10-CM

## 2023-03-23 LAB — COMPREHENSIVE METABOLIC PANEL
ALT: 15 U/L (ref 0–53)
AST: 18 U/L (ref 0–37)
Albumin: 4.6 g/dL (ref 3.5–5.2)
Alkaline Phosphatase: 40 U/L (ref 39–117)
BUN: 12 mg/dL (ref 6–23)
CO2: 29 meq/L (ref 19–32)
Calcium: 9.6 mg/dL (ref 8.4–10.5)
Chloride: 105 meq/L (ref 96–112)
Creatinine, Ser: 0.88 mg/dL (ref 0.40–1.50)
GFR: 90.27 mL/min (ref >60.00)
Glucose, Bld: 114 mg/dL — ABNORMAL HIGH (ref 70–99)
Potassium: 4.3 meq/L (ref 3.5–5.1)
Sodium: 142 meq/L (ref 135–145)
Total Bilirubin: 0.6 mg/dL (ref 0.2–1.2)
Total Protein: 7.2 g/dL (ref 6.0–8.3)

## 2023-03-23 LAB — LIPID PANEL
Cholesterol: 87 mg/dL (ref 0–200)
HDL: 25 mg/dL — ABNORMAL LOW (ref >39.00)
LDL Cholesterol: 52 mg/dL (ref 0–99)
NonHDL: 62.23
Total CHOL/HDL Ratio: 3
Triglycerides: 49 mg/dL (ref 0.0–149.0)
VLDL: 9.8 mg/dL (ref 0.0–40.0)

## 2023-03-23 LAB — MICROALBUMIN / CREATININE URINE RATIO
Creatinine,U: 158 mg/dL
Microalb Creat Ratio: 6.1 mg/g (ref 0.0–30.0)
Microalb, Ur: 9.6 mg/dL — ABNORMAL HIGH (ref 0.0–1.9)

## 2023-03-23 LAB — VITAMIN D 25 HYDROXY (VIT D DEFICIENCY, FRACTURES): VITD: 23.89 ng/mL — ABNORMAL LOW (ref 30.00–100.00)

## 2023-03-23 LAB — CBC WITH DIFFERENTIAL/PLATELET
Basophils Absolute: 0 10*3/uL (ref 0.0–0.1)
Basophils Relative: 0.6 % (ref 0.0–3.0)
Eosinophils Absolute: 0.1 10*3/uL (ref 0.0–0.7)
Eosinophils Relative: 1.6 % (ref 0.0–5.0)
HCT: 40.7 % (ref 39.0–52.0)
Hemoglobin: 13.1 g/dL (ref 13.0–17.0)
Lymphocytes Relative: 46.7 % — ABNORMAL HIGH (ref 12.0–46.0)
Lymphs Abs: 2.5 10*3/uL (ref 0.7–4.0)
MCHC: 32.3 g/dL (ref 30.0–36.0)
MCV: 90.9 fL (ref 78.0–100.0)
Monocytes Absolute: 0.5 10*3/uL (ref 0.1–1.0)
Monocytes Relative: 8.8 % (ref 3.0–12.0)
Neutro Abs: 2.3 10*3/uL (ref 1.4–7.7)
Neutrophils Relative %: 42.3 % — ABNORMAL LOW (ref 43.0–77.0)
Platelets: 260 10*3/uL (ref 150.0–400.0)
RBC: 4.48 Mil/uL (ref 4.22–5.81)
RDW: 14.1 % (ref 11.5–15.5)
WBC: 5.4 10*3/uL (ref 4.0–10.5)

## 2023-03-23 LAB — HEMOGLOBIN A1C: Hgb A1c MFr Bld: 7.4 % — ABNORMAL HIGH (ref 4.6–6.5)

## 2023-03-23 LAB — PSA: PSA: 0.91 ng/mL (ref 0.10–4.00)

## 2023-03-24 LAB — HIV ANTIBODY (ROUTINE TESTING W REFLEX): HIV 1&2 Ab, 4th Generation: NONREACTIVE

## 2023-03-26 ENCOUNTER — Other Ambulatory Visit: Payer: Self-pay | Admitting: Internal Medicine

## 2023-03-26 DIAGNOSIS — E559 Vitamin D deficiency, unspecified: Secondary | ICD-10-CM

## 2023-03-26 MED ORDER — VITAMIN D (ERGOCALCIFEROL) 1.25 MG (50000 UNIT) PO CAPS: 50000.0000 [IU] | ORAL_CAPSULE | ORAL | 0 refills | Status: AC

## 2023-03-28 ENCOUNTER — Other Ambulatory Visit: Payer: Self-pay | Admitting: *Deleted

## 2023-03-28 DIAGNOSIS — E559 Vitamin D deficiency, unspecified: Secondary | ICD-10-CM

## 2023-04-19 LAB — HM DIABETES EYE EXAM

## 2023-06-15 ENCOUNTER — Other Ambulatory Visit: Payer: Self-pay | Admitting: Internal Medicine

## 2023-06-15 DIAGNOSIS — E1169 Type 2 diabetes mellitus with other specified complication: Secondary | ICD-10-CM

## 2023-06-22 ENCOUNTER — Other Ambulatory Visit: Payer: Self-pay | Admitting: Internal Medicine

## 2023-06-22 DIAGNOSIS — E559 Vitamin D deficiency, unspecified: Secondary | ICD-10-CM

## 2023-06-25 ENCOUNTER — Ambulatory Visit (INDEPENDENT_AMBULATORY_CARE_PROVIDER_SITE_OTHER): Payer: BC Managed Care – PPO | Admitting: Internal Medicine

## 2023-06-25 ENCOUNTER — Encounter: Payer: Self-pay | Admitting: Internal Medicine

## 2023-06-25 VITALS — BP 128/78 | HR 76 | Temp 97.5°F | Wt 180.3 lb

## 2023-06-25 DIAGNOSIS — I10 Essential (primary) hypertension: Secondary | ICD-10-CM

## 2023-06-25 DIAGNOSIS — Z7985 Long-term (current) use of injectable non-insulin antidiabetic drugs: Secondary | ICD-10-CM

## 2023-06-25 DIAGNOSIS — E785 Hyperlipidemia, unspecified: Secondary | ICD-10-CM | POA: Diagnosis not present

## 2023-06-25 DIAGNOSIS — E1169 Type 2 diabetes mellitus with other specified complication: Secondary | ICD-10-CM | POA: Diagnosis not present

## 2023-06-25 DIAGNOSIS — E1151 Type 2 diabetes mellitus with diabetic peripheral angiopathy without gangrene: Secondary | ICD-10-CM | POA: Diagnosis not present

## 2023-06-25 LAB — POCT GLYCOSYLATED HEMOGLOBIN (HGB A1C): Hemoglobin A1C: 6.9 % — AB (ref 4.0–5.6)

## 2023-06-25 NOTE — Progress Notes (Signed)
Established Patient Office Visit     CC/Reason for Visit: Follow-up chronic conditions  HPI: Matthew Prince is a 68 y.o. male who is coming in today for the above mentioned reasons. Past Medical History is significant for: Hypertension, hyperlipidemia, type 2 diabetes, obesity, coronary artery disease.  He has been doing well.   Past Medical/Surgical History: Past Medical History:  Diagnosis Date   Allergy    BPV (benign positional vertigo) 10/2009   CAD S/P percutaneous coronary angioplasty    PCI to LAD, the DES   Cardiomyopathy, ischemic - EF ~40-45% with distal Anterior - Antero& InferoApical hypokinesis. 11/29/2012   F/U Echo 02/2013: EF 50-55%. Mild anterior apical and apical lateral HK, Gr 1 DD   Diabetes mellitus    HISTORY OF: ST elevation myocardial infarction (STEMI) of inferior wall 11/29/2012    99% LAD occlusion - PCI with DES   Hyperlipidemia    Hypertension, essential    Low back pain    Presence of drug coated stent in LAD coronary artery 11/29/2012   Xience eXpedition DES 3.0 mm x 18 mm - (3.4 mm)    Past Surgical History:  Procedure Laterality Date   LEFT HEART CATHETERIZATION WITH CORONARY ANGIOGRAM N/A 11/29/2012   Procedure: LEFT HEART CATHETERIZATION WITH CORONARY ANGIOGRAM;  Surgeon: Marykay Lex, MD;  Location: [facility] CATH LAB;  Service: Cardiovascular;  Laterality: N/A;   NECK SURGERY     resection benign tumor   PERCUTANEOUS CORONARY STENT INTERVENTION (PCI-S) N/A 11/29/2012   Procedure: PERCUTANEOUS CORONARY STENT INTERVENTION (PCI-S);  Surgeon: Marykay Lex, MD;  Location: [facility] CATH LAB;;; 99% thrombotic LAD occlusion - Xience Expedition 3.0 mm x 18 mm - post-dilated to 3.4 mm   TRANSTHORACIC ECHOCARDIOGRAM  03/12/2013   Post MI: Low normal EF (50-55%), apical false tendon.  Mild HK of apical anterior and apical lateral wall.  Grade 1 diastolic dysfunction.    Social History:  reports that he has never smoked. He has never used smokeless tobacco. He  reports that he does not currently use drugs. He reports that he does not drink alcohol.  Allergies: Allergies  Allergen Reactions   Hydromet [Hydrocodone Bit-Homatrop Mbr]     Lip swelling with concomitant use with tramadol   Iodine     REACTION: unspecified   Ultram [Tramadol]     Lip swelling with concomitant use of Hytromet    Family History:  Family History  Problem Relation Age of Onset   Healthy Other        Noncontributory; he is not very aware of his family's history.     Current Outpatient Medications:    atorvastatin (LIPITOR) 80 MG tablet, TAKE 1 TABLET BY MOUTH EVERY DAY, Disp: 90 tablet, Rfl: 1   carvedilol (COREG) 12.5 MG tablet, TAKE 1 TABLET BY MOUTH TWICE A DAY WITH FOOD, Disp: 180 tablet, Rfl: 1   clopidogrel (PLAVIX) 75 MG tablet, TAKE 1 TABLET BY MOUTH EVERY DAY, Disp: 90 tablet, Rfl: 1   Dulaglutide (TRULICITY) 3 MG/0.5ML SOPN, Inject 3 mg into the skin once a week as directed., Disp: 6 mL, Rfl: 1   ezetimibe (ZETIA) 10 MG tablet, TAKE 1 TABLET BY MOUTH EVERY DAY, Disp: 90 tablet, Rfl: 1   glipiZIDE (GLUCOTROL XL) 10 MG 24 hr tablet, TAKE 1 TABLET BY MOUTH EVERY DAY WITH BREAKFAST, Disp: 90 tablet, Rfl: 1   glucose blood test strip, Check glucose level before each meal ONE TOUCH VERIO TEST STRIP, Disp: 100 each,  Rfl: 12   lisinopril (ZESTRIL) 10 MG tablet, TAKE 1 TABLET BY MOUTH EVERY DAY, Disp: 90 tablet, Rfl: 1   metFORMIN (GLUCOPHAGE) 1000 MG tablet, TAKE 1 TABLET BY MOUTH TWICE A DAY WITH FOOD, Disp: 180 tablet, Rfl: 1  Current Facility-Administered Medications:    dextrose 5 % solution, , Intravenous, Once, Imogene Burn, MD  Review of Systems:  Negative unless indicated in HPI.   Physical Exam: Vitals:   06/25/23 1523 06/25/23 1528 06/25/23 1600  BP: (!) 140/90 (!) 147/81 128/78  Pulse: 76    Temp: (!) 97.5 F (36.4 C)    TempSrc: Oral    SpO2: 98%    Weight: 180 lb 4.8 oz (81.8 kg)      Body mass index is 27.82 kg/m.   Physical  Exam Vitals reviewed.  Constitutional:      Appearance: Normal appearance.  HENT:     Head: Normocephalic and atraumatic.  Eyes:     Conjunctiva/sclera: Conjunctivae normal.     Pupils: Pupils are equal, round, and reactive to light.  Cardiovascular:     Rate and Rhythm: Normal rate and regular rhythm.  Pulmonary:     Effort: Pulmonary effort is normal.     Breath sounds: Normal breath sounds.  Skin:    General: Skin is warm and dry.  Neurological:     General: No focal deficit present.     Mental Status: He is alert and oriented to person, place, and time.  Psychiatric:        Mood and Affect: Mood normal.        Behavior: Behavior normal.        Thought Content: Thought content normal.        Judgment: Judgment normal.      Impression and Plan:  DM (diabetes mellitus), type 2 with peripheral vascular complications (HCC) -     POCT glycosylated hemoglobin (Hb A1C)  Hyperlipidemia associated with type 2 diabetes mellitus (HCC)  Hypertension, essential   -A1c shows improved control at 6.9, continue Trulicity 3 mg weekly. -Blood pressure is elevated but slightly improved towards end of visit.  He has not had issues with elevated blood pressure recently.  He continues to be compliant with medication.  Advised that he be more vigilant with ambulatory blood pressures and we will follow-up in 3 months. -LDL is at goal at 52 on ezetimibe and max atorvastatin dose.  Time spent:31 minutes reviewing chart, interviewing and examining patient and formulating plan of care.     Chaya Jan, MD [place] [facility] at [facility]

## 2023-07-01 ENCOUNTER — Other Ambulatory Visit: Payer: Self-pay | Admitting: Internal Medicine

## 2023-08-03 ENCOUNTER — Other Ambulatory Visit: Payer: Self-pay | Admitting: Internal Medicine

## 2023-08-03 DIAGNOSIS — E559 Vitamin D deficiency, unspecified: Secondary | ICD-10-CM

## 2023-08-08 ENCOUNTER — Other Ambulatory Visit: Payer: Self-pay | Admitting: Internal Medicine

## 2023-08-08 DIAGNOSIS — E559 Vitamin D deficiency, unspecified: Secondary | ICD-10-CM

## 2023-08-29 ENCOUNTER — Encounter: Payer: Self-pay | Admitting: Internal Medicine

## 2023-09-06 ENCOUNTER — Other Ambulatory Visit: Payer: Self-pay | Admitting: Internal Medicine

## 2023-09-06 DIAGNOSIS — E118 Type 2 diabetes mellitus with unspecified complications: Secondary | ICD-10-CM

## 2023-09-24 ENCOUNTER — Ambulatory Visit (INDEPENDENT_AMBULATORY_CARE_PROVIDER_SITE_OTHER): Payer: BC Managed Care – PPO | Admitting: Internal Medicine

## 2023-09-24 ENCOUNTER — Encounter: Payer: Self-pay | Admitting: Internal Medicine

## 2023-09-24 VITALS — BP 120/80 | HR 80 | Temp 98.1°F | Wt 177.3 lb

## 2023-09-24 DIAGNOSIS — E1151 Type 2 diabetes mellitus with diabetic peripheral angiopathy without gangrene: Secondary | ICD-10-CM

## 2023-09-24 DIAGNOSIS — E1129 Type 2 diabetes mellitus with other diabetic kidney complication: Secondary | ICD-10-CM

## 2023-09-24 DIAGNOSIS — I10 Essential (primary) hypertension: Secondary | ICD-10-CM

## 2023-09-24 DIAGNOSIS — E785 Hyperlipidemia, unspecified: Secondary | ICD-10-CM

## 2023-09-24 DIAGNOSIS — Z9861 Coronary angioplasty status: Secondary | ICD-10-CM

## 2023-09-24 DIAGNOSIS — E1169 Type 2 diabetes mellitus with other specified complication: Secondary | ICD-10-CM | POA: Diagnosis not present

## 2023-09-24 DIAGNOSIS — R809 Proteinuria, unspecified: Secondary | ICD-10-CM

## 2023-09-24 DIAGNOSIS — I2510 Atherosclerotic heart disease of native coronary artery without angina pectoris: Secondary | ICD-10-CM

## 2023-09-24 LAB — POCT GLYCOSYLATED HEMOGLOBIN (HGB A1C): Hemoglobin A1C: 6.9 % — AB (ref 4.0–5.6)

## 2023-09-24 NOTE — Assessment & Plan Note (Signed)
 Well-controlled on atorvastatin  80 mg and ezetimibe  10 mg with an LDL of 52.

## 2023-09-24 NOTE — Assessment & Plan Note (Signed)
On lisinopril

## 2023-09-24 NOTE — Assessment & Plan Note (Signed)
 A1c remains well-controlled at 6.9.

## 2023-09-24 NOTE — Assessment & Plan Note (Signed)
 Well-controlled on current.

## 2023-09-24 NOTE — Progress Notes (Signed)
 Established Patient Office Visit     CC/Reason for Visit: Follow-up chronic medical conditions  HPI: Matthew Prince is a 68 y.o. male who is coming in today for the above mentioned reasons. Past Medical History is significant for: Hypertension, hyperlipidemia, type 2 diabetes, obesity, history of coronary artery disease.  He is back on Trulicity .  He is doing well without acute concerns or complaints.   Past Medical/Surgical History: Past Medical History:  Diagnosis Date   Allergy    BPV (benign positional vertigo) 10/2009   CAD S/P percutaneous coronary angioplasty    PCI to LAD, the DES   Cardiomyopathy, ischemic - EF ~40-45% with distal Anterior - Antero& InferoApical hypokinesis. 11/29/2012   F/U Echo 02/2013: EF 50-55%. Mild anterior apical and apical lateral HK, Gr 1 DD   Diabetes mellitus    HISTORY OF: ST elevation myocardial infarction (STEMI) of inferior wall 11/29/2012    99% LAD occlusion - PCI with DES   Hyperlipidemia    Hypertension, essential    Low back pain    Presence of drug coated stent in LAD coronary artery 11/29/2012   Xience eXpedition DES 3.0 mm x 18 mm - (3.4 mm)    Past Surgical History:  Procedure Laterality Date   LEFT HEART CATHETERIZATION WITH CORONARY ANGIOGRAM N/A 11/29/2012   Procedure: LEFT HEART CATHETERIZATION WITH CORONARY ANGIOGRAM;  Surgeon: Arleen Lacer, MD;  Location: [facility] CATH LAB;  Service: Cardiovascular;  Laterality: N/A;   NECK SURGERY     resection benign tumor   PERCUTANEOUS CORONARY STENT INTERVENTION (PCI-S) N/A 11/29/2012   Procedure: PERCUTANEOUS CORONARY STENT INTERVENTION (PCI-S);  Surgeon: Arleen Lacer, MD;  Location: [facility] CATH LAB;;; 99% thrombotic LAD occlusion - Xience Expedition 3.0 mm x 18 mm - post-dilated to 3.4 mm   TRANSTHORACIC ECHOCARDIOGRAM  03/12/2013   Post MI: Low normal EF (50-55%), apical false tendon.  Mild HK of apical anterior and apical lateral wall.  Grade 1 diastolic dysfunction.    Social History:   reports that he has never smoked. He has never used smokeless tobacco. He reports that he does not currently use drugs. He reports that he does not drink alcohol.  Allergies: Allergies  Allergen Reactions   Hydromet [Hydrocodone  Bit-Homatrop Mbr]     Lip swelling with concomitant use with tramadol    Iodine     REACTION: unspecified   Ultram  [Tramadol ]     Lip swelling with concomitant use of Hytromet    Family History:  Family History  Problem Relation Age of Onset   Healthy Other        Noncontributory; he is not very aware of his family's history.     Current Outpatient Medications:    atorvastatin  (LIPITOR) 80 MG tablet, TAKE 1 TABLET BY MOUTH EVERY DAY, Disp: 90 tablet, Rfl: 1   carvedilol  (COREG ) 12.5 MG tablet, TAKE 1 TABLET BY MOUTH TWICE A DAY WITH FOOD, Disp: 180 tablet, Rfl: 1   clopidogrel  (PLAVIX ) 75 MG tablet, TAKE 1 TABLET BY MOUTH EVERY DAY, Disp: 90 tablet, Rfl: 1   Dulaglutide  (TRULICITY ) 3 MG/0.5ML SOAJ, INJECT 3 MG INTO THE SKIN ONCE A WEEK AS DIRECTED., Disp: 12 mL, Rfl: 2   ezetimibe  (ZETIA ) 10 MG tablet, TAKE 1 TABLET BY MOUTH EVERY DAY, Disp: 90 tablet, Rfl: 1   glipiZIDE  (GLUCOTROL  XL) 10 MG 24 hr tablet, TAKE 1 TABLET BY MOUTH EVERY DAY WITH BREAKFAST, Disp: 90 tablet, Rfl: 1   glucose blood test strip, Check  glucose level before each meal ONE TOUCH VERIO TEST STRIP, Disp: 100 each, Rfl: 12   lisinopril  (ZESTRIL ) 10 MG tablet, TAKE 1 TABLET BY MOUTH EVERY DAY, Disp: 90 tablet, Rfl: 1   metFORMIN  (GLUCOPHAGE ) 1000 MG tablet, TAKE 1 TABLET BY MOUTH TWICE A DAY WITH FOOD, Disp: 180 tablet, Rfl: 1  Current Facility-Administered Medications:    dextrose  5 % solution, , Intravenous, Once, Daina Drum, MD  Review of Systems:  Negative unless indicated in HPI.   Physical Exam: Vitals:   09/24/23 1457  BP: 120/80  Pulse: 80  Temp: 98.1 F (36.7 C)  TempSrc: Oral  SpO2: 98%  Weight: 177 lb 4.8 oz (80.4 kg)    Body mass index is 27.36  kg/m.   Physical Exam Vitals reviewed.  Constitutional:      Appearance: Normal appearance.  HENT:     Head: Normocephalic and atraumatic.  Eyes:     Conjunctiva/sclera: Conjunctivae normal.  Cardiovascular:     Rate and Rhythm: Normal rate and regular rhythm.  Pulmonary:     Effort: Pulmonary effort is normal.     Breath sounds: Normal breath sounds.  Skin:    General: Skin is warm and dry.  Neurological:     General: No focal deficit present.     Mental Status: He is alert and oriented to person, place, and time.  Psychiatric:        Mood and Affect: Mood normal.        Behavior: Behavior normal.        Thought Content: Thought content normal.        Judgment: Judgment normal.      Impression and Plan:  DM (diabetes mellitus), type 2 with peripheral vascular complications (HCC) Assessment & Plan: A1c remains well-controlled at 6.9.   Orders: -     POCT glycosylated hemoglobin (Hb A1C)  Hyperlipidemia associated with type 2 diabetes mellitus (HCC) Assessment & Plan: Well-controlled on atorvastatin  80 mg and ezetimibe  10 mg with an LDL of 52.   Hypertension, essential Assessment & Plan: Well-controlled on current.   Microalbuminuria due to type 2 diabetes mellitus (HCC) Assessment & Plan: On lisinopril .   CAD (coronary artery disease), native coronary artery - (STEMI) LAD 99% Thrombosis - PCI with Xience eXpedition 3.0 mm x `18 mm (3.4 mm post)     Time spent:31 minutes reviewing chart, interviewing and examining patient and formulating plan of care.     Marguerita Shih, MD [facility]

## 2023-11-02 ENCOUNTER — Other Ambulatory Visit: Payer: Self-pay | Admitting: Internal Medicine

## 2023-11-22 ENCOUNTER — Other Ambulatory Visit: Payer: Self-pay | Admitting: Internal Medicine

## 2023-11-22 DIAGNOSIS — E118 Type 2 diabetes mellitus with unspecified complications: Secondary | ICD-10-CM

## 2023-11-22 DIAGNOSIS — Z0000 Encounter for general adult medical examination without abnormal findings: Secondary | ICD-10-CM

## 2024-01-13 ENCOUNTER — Other Ambulatory Visit: Payer: Self-pay | Admitting: Internal Medicine

## 2024-01-13 DIAGNOSIS — E1169 Type 2 diabetes mellitus with other specified complication: Secondary | ICD-10-CM

## 2024-01-24 ENCOUNTER — Encounter: Payer: Self-pay | Admitting: Internal Medicine

## 2024-01-24 ENCOUNTER — Ambulatory Visit: Admitting: Internal Medicine

## 2024-01-24 VITALS — BP 140/79 | HR 73 | Temp 98.1°F | Wt 181.5 lb

## 2024-01-24 DIAGNOSIS — E1169 Type 2 diabetes mellitus with other specified complication: Secondary | ICD-10-CM | POA: Diagnosis not present

## 2024-01-24 DIAGNOSIS — R809 Proteinuria, unspecified: Secondary | ICD-10-CM

## 2024-01-24 DIAGNOSIS — E785 Hyperlipidemia, unspecified: Secondary | ICD-10-CM

## 2024-01-24 DIAGNOSIS — E1129 Type 2 diabetes mellitus with other diabetic kidney complication: Secondary | ICD-10-CM

## 2024-01-24 DIAGNOSIS — I10 Essential (primary) hypertension: Secondary | ICD-10-CM

## 2024-01-24 DIAGNOSIS — E1151 Type 2 diabetes mellitus with diabetic peripheral angiopathy without gangrene: Secondary | ICD-10-CM | POA: Diagnosis not present

## 2024-01-24 DIAGNOSIS — Z7985 Long-term (current) use of injectable non-insulin antidiabetic drugs: Secondary | ICD-10-CM

## 2024-01-24 LAB — POCT GLYCOSYLATED HEMOGLOBIN (HGB A1C): Hemoglobin A1C: 8.2 % — AB (ref 4.0–5.6)

## 2024-01-24 MED ORDER — TIRZEPATIDE 2.5 MG/0.5ML ~~LOC~~ SOAJ: 2.5000 mg | SUBCUTANEOUS | 0 refills | Status: AC

## 2024-01-24 NOTE — Assessment & Plan Note (Signed)
 Diabetes is uncontrolled with an A1c of 8.2.  I will change Trulicity  over to Mounjaro .  Goal to increase dose every 4 weeks.  I will also consider adding an SGLT2 at next visit.

## 2024-01-24 NOTE — Progress Notes (Signed)
 Established Patient Office Visit     CC/Reason for Visit: Follow-up chronic conditions  HPI: Matthew Prince is a 68 y.o. male who is coming in today for the above mentioned reasons. Past Medical History is significant for: Hypertension, hyperlipidemia, type 2 diabetes, history of coronary artery disease.  Has no acute concerns or complaints.   Past Medical/Surgical History: Past Medical History:  Diagnosis Date   Allergy    BPV (benign positional vertigo) 10/2009   CAD S/P percutaneous coronary angioplasty    PCI to LAD, the DES   Cardiomyopathy, ischemic - EF ~40-45% with distal Anterior - Antero& InferoApical hypokinesis. 11/29/2012   F/U Echo 02/2013: EF 50-55%. Mild anterior apical and apical lateral HK, Gr 1 DD   Diabetes mellitus    HISTORY OF: ST elevation myocardial infarction (STEMI) of inferior wall 11/29/2012    99% LAD occlusion - PCI with DES   Hyperlipidemia    Hypertension, essential    Low back pain    Presence of drug coated stent in LAD coronary artery 11/29/2012   Xience eXpedition DES 3.0 mm x 18 mm - (3.4 mm)    Past Surgical History:  Procedure Laterality Date   LEFT HEART CATHETERIZATION WITH CORONARY ANGIOGRAM N/A 11/29/2012   Procedure: LEFT HEART CATHETERIZATION WITH CORONARY ANGIOGRAM;  Surgeon: Alm LELON Clay, MD;  Location: [facility] CATH LAB;  Service: Cardiovascular;  Laterality: N/A;   NECK SURGERY     resection benign tumor   PERCUTANEOUS CORONARY STENT INTERVENTION (PCI-S) N/A 11/29/2012   Procedure: PERCUTANEOUS CORONARY STENT INTERVENTION (PCI-S);  Surgeon: Alm LELON Clay, MD;  Location: [facility] CATH LAB;;; 99% thrombotic LAD occlusion - Xience Expedition 3.0 mm x 18 mm - post-dilated to 3.4 mm   TRANSTHORACIC ECHOCARDIOGRAM  03/12/2013   Post MI: Low normal EF (50-55%), apical false tendon.  Mild HK of apical anterior and apical lateral wall.  Grade 1 diastolic dysfunction.    Social History:  reports that he has never smoked. He has never used smokeless  tobacco. He reports that he does not currently use drugs. He reports that he does not drink alcohol.  Allergies: Allergies  Allergen Reactions   Hydromet [Hydrocodone  Bit-Homatrop Mbr]     Lip swelling with concomitant use with tramadol    Iodine     REACTION: unspecified   Ultram  [Tramadol ]     Lip swelling with concomitant use of Hytromet    Family History:  Family History  Problem Relation Age of Onset   Healthy Other        Noncontributory; he is not very aware of his family's history.     Current Outpatient Medications:    atorvastatin  (LIPITOR) 80 MG tablet, TAKE 1 TABLET BY MOUTH EVERY DAY, Disp: 90 tablet, Rfl: 1   carvedilol  (COREG ) 12.5 MG tablet, TAKE 1 TABLET BY MOUTH TWICE A DAY WITH FOOD, Disp: 180 tablet, Rfl: 1   clopidogrel  (PLAVIX ) 75 MG tablet, TAKE 1 TABLET BY MOUTH EVERY DAY, Disp: 90 tablet, Rfl: 1   ezetimibe  (ZETIA ) 10 MG tablet, TAKE 1 TABLET BY MOUTH EVERY DAY, Disp: 90 tablet, Rfl: 1   glipiZIDE  (GLUCOTROL  XL) 10 MG 24 hr tablet, TAKE 1 TABLET BY MOUTH EVERY DAY WITH BREAKFAST, Disp: 90 tablet, Rfl: 1   glucose blood test strip, Check glucose level before each meal ONE TOUCH VERIO TEST STRIP, Disp: 100 each, Rfl: 12   lisinopril  (ZESTRIL ) 10 MG tablet, TAKE 1 TABLET BY MOUTH EVERY DAY, Disp: 90 tablet, Rfl: 1  metFORMIN  (GLUCOPHAGE ) 1000 MG tablet, TAKE 1 TABLET BY MOUTH TWICE A DAY WITH FOOD, Disp: 180 tablet, Rfl: 1   tirzepatide  (MOUNJARO ) 2.5 MG/0.5ML Pen, Inject 2.5 mg into the skin once a week., Disp: 2 mL, Rfl: 0  Current Facility-Administered Medications:    dextrose  5 % solution, , Intravenous, Once, Federico Rosario BROCKS, MD  Review of Systems:  Negative unless indicated in HPI.   Physical Exam: Vitals:   01/24/24 1456 01/24/24 1500  BP: (!) 160/80 (!) 140/79  Pulse: 73   Temp: 98.1 F (36.7 C)   TempSrc: Oral   SpO2: 98%   Weight: 181 lb 8 oz (82.3 kg)     Body mass index is 28.01 kg/m.   Physical Exam Vitals reviewed.   Constitutional:      Appearance: Normal appearance.  HENT:     Head: Normocephalic and atraumatic.  Eyes:     Conjunctiva/sclera: Conjunctivae normal.  Cardiovascular:     Rate and Rhythm: Normal rate and regular rhythm.  Pulmonary:     Effort: Pulmonary effort is normal.     Breath sounds: Normal breath sounds.  Skin:    General: Skin is warm and dry.  Neurological:     General: No focal deficit present.     Mental Status: He is alert and oriented to person, place, and time.  Psychiatric:        Mood and Affect: Mood normal.        Behavior: Behavior normal.        Thought Content: Thought content normal.        Judgment: Judgment normal.      Impression and Plan:  DM (diabetes mellitus), type 2 with peripheral vascular complications (HCC) Assessment & Plan: Diabetes is uncontrolled with an A1c of 8.2.  I will change Trulicity  over to Mounjaro .  Goal to increase dose every 4 weeks.  I will also consider adding an SGLT2 at next visit.  Orders: -     POCT glycosylated hemoglobin (Hb A1C) -     Tirzepatide ; Inject 2.5 mg into the skin once a week.  Dispense: 2 mL; Refill: 0  Hyperlipidemia associated with type 2 diabetes mellitus (HCC) Assessment & Plan: Well-controlled on atorvastatin  80 mg and ezetimibe  10 mg with an LDL of 52.   Hypertension, essential Assessment & Plan: Currently is uncontrolled.  Have asked him to do ambulatory blood pressure measurements.  If still elevated at next visit in 3 months we will consider changing medication to Diovan HCT.   Microalbuminuria due to type 2 diabetes mellitus (HCC) Assessment & Plan: Currently on lisinopril , consider adding SGLT2 next visit.      Time spent:33 minutes reviewing chart, interviewing and examining patient and formulating plan of care.     Tully Theophilus Andrews, MD [facility]

## 2024-01-24 NOTE — Assessment & Plan Note (Signed)
 Currently is uncontrolled.  Have asked him to do ambulatory blood pressure measurements.  If still elevated at next visit in 3 months we will consider changing medication to Diovan HCT.

## 2024-01-24 NOTE — Assessment & Plan Note (Signed)
 Well-controlled on atorvastatin  80 mg and ezetimibe  10 mg with an LDL of 52.

## 2024-01-24 NOTE — Assessment & Plan Note (Signed)
 Currently on lisinopril , consider adding SGLT2 next visit.

## 2024-03-15 ENCOUNTER — Other Ambulatory Visit: Payer: Self-pay | Admitting: Internal Medicine

## 2024-03-18 ENCOUNTER — Telehealth: Payer: Self-pay

## 2024-03-18 NOTE — Telephone Encounter (Signed)
 Patient was identified as falling into the [place] Measure - Diabetes.   Patient was: Appointment already scheduled for:  04/29/2024 with Dr. Theophilus.

## 2024-03-21 ENCOUNTER — Other Ambulatory Visit: Payer: Self-pay | Admitting: Internal Medicine

## 2024-04-29 ENCOUNTER — Ambulatory Visit: Admitting: Internal Medicine

## 2024-05-09 ENCOUNTER — Telehealth: Payer: Self-pay

## 2024-05-09 NOTE — Telephone Encounter (Signed)
 Patient was identified as falling into the [place] Measure - Diabetes.   Patient was: Left voicemail to schedule with primary care provider.

## 2024-06-14 ENCOUNTER — Other Ambulatory Visit: Payer: Self-pay | Admitting: Internal Medicine

## 2024-06-15 ENCOUNTER — Other Ambulatory Visit: Payer: Self-pay | Admitting: Internal Medicine

## 2024-06-15 DIAGNOSIS — E118 Type 2 diabetes mellitus with unspecified complications: Secondary | ICD-10-CM

## 2024-06-15 DIAGNOSIS — Z0000 Encounter for general adult medical examination without abnormal findings: Secondary | ICD-10-CM
